# Patient Record
Sex: Male | Born: 1945
Health system: Southern US, Community
[De-identification: ages and names within clinical notes are randomized; demographics above are authoritative.]

## PROBLEM LIST (undated history)

## (undated) DIAGNOSIS — Z8679 Personal history of other diseases of the circulatory system: Secondary | ICD-10-CM

## (undated) DIAGNOSIS — I739 Peripheral vascular disease, unspecified: Secondary | ICD-10-CM

## (undated) DIAGNOSIS — A630 Anogenital (venereal) warts: Secondary | ICD-10-CM

## (undated) DIAGNOSIS — I714 Abdominal aortic aneurysm, without rupture, unspecified: Secondary | ICD-10-CM

## (undated) DIAGNOSIS — E785 Hyperlipidemia, unspecified: Secondary | ICD-10-CM

## (undated) DIAGNOSIS — I6529 Occlusion and stenosis of unspecified carotid artery: Secondary | ICD-10-CM

## (undated) DIAGNOSIS — I10 Essential (primary) hypertension: Secondary | ICD-10-CM

## (undated) HISTORY — DX: Essential (primary) hypertension: I10

## (undated) HISTORY — DX: Anogenital (venereal) warts: A63.0

## (undated) HISTORY — DX: Hyperlipidemia, unspecified: E78.5

## (undated) HISTORY — PX: COLONOSCOPY: SHX174

## (undated) HISTORY — DX: Peripheral vascular disease, unspecified: I73.9

## (undated) HISTORY — DX: Abdominal aortic aneurysm, without rupture, unspecified: I71.40

## (undated) HISTORY — DX: Abdominal aortic aneurysm, without rupture: I71.4

## (undated) HISTORY — DX: Occlusion and stenosis of unspecified carotid artery: I65.29

## (undated) HISTORY — DX: Personal history of other diseases of the circulatory system: Z86.79

---
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
## 2005-04-24 ENCOUNTER — Ambulatory Visit: Payer: Self-pay | Admitting: Internal Medicine

## 2005-04-29 ENCOUNTER — Ambulatory Visit: Payer: Self-pay | Admitting: Internal Medicine

## 2005-05-10 HISTORY — PX: CAROTID ENDARTERECTOMY: SUR193

## 2005-05-17 ENCOUNTER — Ambulatory Visit: Payer: Self-pay

## 2005-05-17 ENCOUNTER — Encounter: Payer: Self-pay | Admitting: Cardiology

## 2005-05-23 ENCOUNTER — Ambulatory Visit: Payer: Self-pay | Admitting: Cardiology

## 2005-05-28 ENCOUNTER — Encounter: Admission: RE | Admit: 2005-05-28 | Discharge: 2005-05-28 | Payer: Self-pay | Admitting: *Deleted

## 2005-05-31 ENCOUNTER — Inpatient Hospital Stay (HOSPITAL_COMMUNITY): Admission: RE | Admit: 2005-05-31 | Discharge: 2005-06-01 | Payer: Self-pay | Admitting: *Deleted

## 2005-05-31 ENCOUNTER — Encounter (INDEPENDENT_AMBULATORY_CARE_PROVIDER_SITE_OTHER): Payer: Self-pay | Admitting: *Deleted

## 2005-06-28 ENCOUNTER — Ambulatory Visit: Payer: Self-pay | Admitting: Internal Medicine

## 2005-07-19 ENCOUNTER — Ambulatory Visit: Payer: Self-pay | Admitting: Gastroenterology

## 2005-07-30 ENCOUNTER — Ambulatory Visit: Payer: Self-pay | Admitting: Gastroenterology

## 2005-07-30 ENCOUNTER — Encounter (INDEPENDENT_AMBULATORY_CARE_PROVIDER_SITE_OTHER): Payer: Self-pay | Admitting: *Deleted

## 2005-10-23 ENCOUNTER — Ambulatory Visit: Payer: Self-pay | Admitting: Cardiology

## 2005-11-18 ENCOUNTER — Ambulatory Visit: Payer: Self-pay

## 2005-12-25 ENCOUNTER — Ambulatory Visit: Payer: Self-pay | Admitting: Cardiology

## 2006-07-14 ENCOUNTER — Ambulatory Visit: Payer: Self-pay | Admitting: Internal Medicine

## 2006-08-07 ENCOUNTER — Ambulatory Visit: Payer: Self-pay | Admitting: Internal Medicine

## 2006-08-07 LAB — CONVERTED CEMR LAB
Bilirubin, Direct: 0.1 mg/dL (ref 0.0–0.3)
Cholesterol: 136 mg/dL (ref 0–200)
GFR calc Af Amer: 97 mL/min
GFR calc non Af Amer: 80 mL/min
Glucose, Bld: 113 mg/dL — ABNORMAL HIGH (ref 70–99)
HDL: 38.7 mg/dL — ABNORMAL LOW (ref >39.0)
Hgb A1c MFr Bld: 5.9 % (ref 4.6–6.0)
LDL Cholesterol: 64 mg/dL (ref 0–99)
Sodium: 138 meq/L (ref 135–145)
TSH: 2.6 microintl units/mL (ref 0.35–5.50)
Total CHOL/HDL Ratio: 3.5

## 2006-08-28 ENCOUNTER — Ambulatory Visit: Payer: Self-pay | Admitting: Internal Medicine

## 2006-12-25 ENCOUNTER — Emergency Department (HOSPITAL_COMMUNITY): Admission: EM | Admit: 2006-12-25 | Discharge: 2006-12-25 | Payer: Self-pay | Admitting: Emergency Medicine

## 2007-10-16 DIAGNOSIS — A630 Anogenital (venereal) warts: Secondary | ICD-10-CM | POA: Insufficient documentation

## 2007-10-16 DIAGNOSIS — R0989 Other specified symptoms and signs involving the circulatory and respiratory systems: Secondary | ICD-10-CM | POA: Insufficient documentation

## 2007-10-16 DIAGNOSIS — E785 Hyperlipidemia, unspecified: Secondary | ICD-10-CM | POA: Insufficient documentation

## 2007-10-16 DIAGNOSIS — I739 Peripheral vascular disease, unspecified: Secondary | ICD-10-CM | POA: Insufficient documentation

## 2007-10-16 DIAGNOSIS — F1010 Alcohol abuse, uncomplicated: Secondary | ICD-10-CM | POA: Insufficient documentation

## 2007-10-16 DIAGNOSIS — I10 Essential (primary) hypertension: Secondary | ICD-10-CM | POA: Insufficient documentation

## 2007-10-16 HISTORY — DX: Peripheral vascular disease, unspecified: I73.9

## 2007-10-16 HISTORY — DX: Essential (primary) hypertension: I10

## 2007-10-19 ENCOUNTER — Ambulatory Visit: Payer: Self-pay | Admitting: Internal Medicine

## 2007-10-22 ENCOUNTER — Ambulatory Visit: Payer: Self-pay

## 2007-10-22 ENCOUNTER — Encounter: Payer: Self-pay | Admitting: Internal Medicine

## 2007-12-07 ENCOUNTER — Encounter: Payer: Self-pay | Admitting: Internal Medicine

## 2007-12-23 ENCOUNTER — Ambulatory Visit: Payer: Self-pay | Admitting: Internal Medicine

## 2007-12-23 LAB — CONVERTED CEMR LAB
AST: 25 units/L (ref 0–37)
Albumin: 4 g/dL (ref 3.5–5.2)
Alkaline Phosphatase: 51 units/L (ref 39–117)
BUN: 16 mg/dL (ref 6–23)
Chloride: 107 meq/L (ref 96–112)
Cholesterol: 137 mg/dL (ref 0–200)
GFR calc non Af Amer: 80 mL/min
Glucose, Bld: 126 mg/dL — ABNORMAL HIGH (ref 70–99)
Potassium: 4.7 meq/L (ref 3.5–5.1)
Total Protein: 7.3 g/dL (ref 6.0–8.3)

## 2008-01-18 ENCOUNTER — Ambulatory Visit: Payer: Self-pay | Admitting: Internal Medicine

## 2008-02-04 ENCOUNTER — Encounter: Payer: Self-pay | Admitting: Internal Medicine

## 2008-10-13 ENCOUNTER — Ambulatory Visit: Payer: Self-pay | Admitting: Internal Medicine

## 2008-10-19 ENCOUNTER — Ambulatory Visit: Payer: Self-pay | Admitting: Internal Medicine

## 2008-10-19 LAB — CONVERTED CEMR LAB
ALT: 34 units/L (ref 0–53)
AST: 27 units/L (ref 0–37)
Alkaline Phosphatase: 54 units/L (ref 39–117)
BUN: 13 mg/dL (ref 6–23)
Basophils Absolute: 0 10*3/uL (ref 0.0–0.1)
Bilirubin, Direct: 0.1 mg/dL (ref 0.0–0.3)
CO2: 31 meq/L (ref 19–32)
Cholesterol: 158 mg/dL (ref 0–200)
Eosinophils Absolute: 0.1 10*3/uL (ref 0.0–0.7)
GFR calc non Af Amer: 79.77 mL/min (ref >60)
Glucose, Bld: 115 mg/dL — ABNORMAL HIGH (ref 70–99)
HCT: 37.2 % — ABNORMAL LOW (ref 39.0–52.0)
Lymphs Abs: 1.9 10*3/uL (ref 0.7–4.0)
MCHC: 35.6 g/dL (ref 30.0–36.0)
MCV: 93.5 fL (ref 78.0–100.0)
Monocytes Absolute: 0.6 10*3/uL (ref 0.1–1.0)
Monocytes Relative: 8.9 % (ref 3.0–12.0)
Platelets: 184 10*3/uL (ref 150.0–400.0)
Potassium: 4.3 meq/L (ref 3.5–5.1)
RDW: 12.6 % (ref 11.5–14.6)
Total Bilirubin: 1 mg/dL (ref 0.3–1.2)
Total Protein: 7.5 g/dL (ref 6.0–8.3)

## 2008-10-20 ENCOUNTER — Encounter: Payer: Self-pay | Admitting: Internal Medicine

## 2008-10-21 ENCOUNTER — Encounter: Payer: Self-pay | Admitting: Internal Medicine

## 2008-10-21 ENCOUNTER — Ambulatory Visit: Payer: Self-pay

## 2009-04-17 ENCOUNTER — Ambulatory Visit: Payer: Self-pay | Admitting: Internal Medicine

## 2009-04-17 DIAGNOSIS — R7309 Other abnormal glucose: Secondary | ICD-10-CM | POA: Insufficient documentation

## 2009-10-09 ENCOUNTER — Ambulatory Visit: Payer: Self-pay | Admitting: Internal Medicine

## 2009-10-09 LAB — CONVERTED CEMR LAB
ALT: 29 units/L (ref 0–53)
Albumin: 3.9 g/dL (ref 3.5–5.2)
CO2: 30 meq/L (ref 19–32)
Calcium: 9.3 mg/dL (ref 8.4–10.5)
Chloride: 105 meq/L (ref 96–112)
Creatinine, Ser: 1 mg/dL (ref 0.4–1.5)
Glucose, Bld: 120 mg/dL — ABNORMAL HIGH (ref 70–99)
HDL: 38.3 mg/dL — ABNORMAL LOW (ref >39.00)
Hgb A1c MFr Bld: 6.3 % (ref 4.6–6.5)
Total Protein: 6.8 g/dL (ref 6.0–8.3)
Triglycerides: 277 mg/dL — ABNORMAL HIGH (ref 0.0–149.0)

## 2009-10-16 ENCOUNTER — Ambulatory Visit: Payer: Self-pay | Admitting: Internal Medicine

## 2010-02-14 ENCOUNTER — Ambulatory Visit: Payer: Self-pay | Admitting: Internal Medicine

## 2010-02-14 LAB — CONVERTED CEMR LAB
BUN: 15 mg/dL (ref 6–23)
Chloride: 101 meq/L (ref 96–112)
GFR calc non Af Amer: 89.72 mL/min (ref >60)
Glucose, Bld: 105 mg/dL — ABNORMAL HIGH (ref 70–99)
Hgb A1c MFr Bld: 6.2 % (ref 4.6–6.5)
Potassium: 4.7 meq/L (ref 3.5–5.1)
Sodium: 139 meq/L (ref 135–145)

## 2010-02-19 ENCOUNTER — Ambulatory Visit: Payer: Self-pay | Admitting: Internal Medicine

## 2010-04-13 ENCOUNTER — Telehealth: Payer: Self-pay | Admitting: Internal Medicine

## 2010-06-10 HISTORY — PX: OTHER SURGICAL HISTORY: SHX169

## 2010-07-10 NOTE — Assessment & Plan Note (Signed)
Summary: 6 month follow up/mhf   Vital Signs:  Patient profile:   65 year old male Height:      70 inches Weight:      207 pounds BMI:     29.81 Temp:     98.0 degrees F Pulse rate:   68 / minute Pulse rhythm:   regular Resp:     18 per minute BP sitting:   114 / 74  (right arm) Cuff size:   large  Vitals Entered By: Glendell Docker CMA (Oct 16, 2009 11:17 AM) CC: Rm 2- 6 Month Follow up disease management Is Patient Diabetic? No Comments Refill on  Lisinopril Hct   Primary Care Provider:  Dondra Spry DO  CC:  Rm 2- 6 Month Follow up disease management.  History of Present Illness:  Hypertension Follow-Up      This is a 65 year old man who presents for Hypertension follow-up.  The patient denies headaches and edema.  The patient denies the following associated symptoms: chest pain.  Compliance with medications (by patient report) has been near 100%.  The patient reports that dietary compliance has been fair.  some wt gain since prev visit.  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   Preventive Screening-Counseling & Management  Alcohol-Tobacco     Smoking Status: quit  Allergies (verified): No Known Drug Allergies  Past History:  Past Medical History: Hyperlipidemia Hypertension History of carotid stenosis - S/P right carotid endarterectomy 12/06 Hx of Left lower ext claudication  Hx of genital warts    Past Surgical History: Knee surgery 1962  Appendectomy 1960  Right carotid endarterectomy 12/06  Family History: Significant for mother deceased, age 80, had her first cerebrovascular accident in her 31s.  Patient has a brother with peripheral vascular disease and a father who is status post leg amputation secondary to peripheral vascular disease.  Patient denies any early heart disease in the family.   Prostate ca - no     Social History: Married -to his second wife.  Has a daughter from his first marriage who is in her early 62s.   He  drinks approximately 4 alcoholic beverages, beer, a day.  He has done so since age 69.  Denies any binge drinking or heavy alcohol use.  Quit tobacco 2 year ago.  He has smoked on and off x20-30 years.  For prolonged periods he has smoked 2-3 packs a day.   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   Neck:  faint right carotid bruit Lungs:  normal respiratory effort and normal breath sounds.   Heart:  normal rate, regular rhythm, and no gallop.   Extremities:  No lower extremity edema  Neurologic:  cranial nerves II-XII intact and gait normal.   Psych:  normally interactive and good eye contact.     Impression & Recommendations:  Problem # 1:  DIABETES MELLITUS, TYPE II, BORDERLINE (ICD-790.29) A1c slightly worse. He was exposed to agent orange during [place] war which apparently can increase risk for DM. start metformin.  limit carb intake.  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 1/2 by mouth two times a day x 1 week,  then one by mouth two times a day  Labs Reviewed: Creat: 1.0 (10/09/2009)  Problem # 2:  HYPERTENSION (ICD-401.9) stable.  Maintain current medication regimen.  His updated medication list for this problem includes:    Lisinopril-hydrochlorothiazide 10-12.5 Mg Tabs (Lisinopril-hydrochlorothiazide) .Marland Kitchen... Take 1 tablet by mouth once a day  BP today: 114/74 Prior BP: 124/60 (04/17/2009)  Labs Reviewed: K+: 4.6 (10/09/2009) Creat: : 1.0 (10/09/2009)   Chol: 151 (10/09/2009)   HDL: 38.30 (10/09/2009)   LDL: 70 (12/23/2007)   TG: 277.0 (10/09/2009)  Complete Medication List: 1)  Levitra 20 Mg Tabs (Vardenafil hcl) .... Take 1 tablet by mouth once a day 2)  Lisinopril-hydrochlorothiazide 10-12.5 Mg Tabs (Lisinopril-hydrochlorothiazide) .... Take 1 tablet by mouth once  a day 3)  Lipitor 80 Mg Tabs (Atorvastatin calcium) .Marland Kitchen.. 1 tablet by mouth every evening 4)  Metformin Hcl 500 Mg Tabs (Metformin hcl) .... 1/2 by mouth two times a day x 1 week,  then one by mouth two times a day  Patient Instructions: 1)  Please schedule a follow-up appointment in 4 months. 2)  BMP prior to visit, ICD-9:  401.9 3)  HbgA1C prior to visit, ICD-9:  790.29 4)  Limit your carb intake to 30 grams per meal / 100 grams per day 5)  Please return for lab work one (1) week before your next appointment.  Prescriptions: LEVITRA 20 MG  TABS (VARDENAFIL HCL) Take 1 tablet by mouth once a day  #5 x 11   Entered and Authorized by:   D. Thomos Lemons DO   Signed by:   D. Thomos Lemons DO on 10/16/2009   Method used:   [facility]        [facility]  [address] [phone][facility] (retail)       [address]       [place], [place]  [address]       Ph: [phone] or [phone]       Fax: [phone]   RxID:   [id number] LIPITOR 80 MG  TABS (ATORVASTATIN CALCIUM) 1 tablet by mouth every evening  #90 x 3   Entered and Authorized by:   D. Thomos Lemons DO   Signed by:   D. Thomos Lemons DO on 10/16/2009   Method used:   [facility]        [facility] [phone]* (retail)       [address]       [place], [place]  [address]       Ph: [phone] or [phone]       Fax: [phone]   RxID:   [id number] LISINOPRIL-HYDROCHLOROTHIAZIDE 10-12.5 MG TABS (LISINOPRIL-HYDROCHLOROTHIAZIDE) Take 1 tablet by mouth once a day  #90 x 3   Entered and Authorized by:   D. Thomos Lemons DO   Signed by:   D. Thomos Lemons DO on 10/16/2009   Method used:   [facility]        [facility] [phone]* (retail)       [address]       [place], [place]  [address]       Ph: [phone] or [phone]       Fax: [phone]   RxID:   [id number] METFORMIN HCL 500 MG TABS (METFORMIN HCL) 1/2 by mouth  two times a day x 1 week,  then one by mouth two times a day  #60 x 3  Entered and Authorized by:   D. Thomos Lemons DO   Signed by:   D. Thomos Lemons DO on 10/16/2009   Method used:   [facility]        [facility] [phone]* (retail)       [address]       [place], [place]  [address]       Ph: [phone] or [phone]       Fax: [phone]   RxID:   [id number]   Current Allergies (reviewed today): No known allergies

## 2010-07-10 NOTE — Progress Notes (Signed)
Summary: Medication Refill  Phone Note Call from Patient Call back at [phone]   Caller: Patient Summary of Call: patient called requesting refill of Lipitor to [facility] and [facility].He was informed rx would be sent, and to check with the pharmacy for the medication. Initial call taken by: Glendell Docker CMA,  April 13, 2010 3:10 PM    Prescriptions: LEVITRA 20 MG  TABS (VARDENAFIL HCL) Take 1 tablet by mouth once a day  #5 x 5   Entered by:   Glendell Docker CMA   Authorized by:   D. Thomos Lemons DO   Signed by:   Glendell Docker CMA on 04/13/2010   Method used:   [facility]        [facility] [phone]* (retail)       [address]       [place], [place]  [address]       Ph: [phone] or [phone]       Fax: [phone]   RxID:   [id number] LIPITOR 80 MG  TABS (ATORVASTATIN CALCIUM) 1 tablet by mouth every evening  #90 x 3   Entered by:   Glendell Docker CMA   Authorized by:   D. Thomos Lemons DO   Signed by:   Glendell Docker CMA on 04/13/2010   Method used:   Electronically to        [facility]* (retail)             ,          Ph: [phone]       Fax: [phone]   RxID:   [id number]

## 2010-07-10 NOTE — Assessment & Plan Note (Signed)
Summary: 4 month follow up/mhf   Vital Signs:  Patient profile:   65 year old male Weight:      201 pounds BMI:     28.94 O2 Sat:      98 % on Room air Temp:     97.8 degrees F oral Pulse rate:   69 / minute Pulse rhythm:   regular Resp:     18 per minute BP sitting:   110 / 70  (left arm) Cuff size:   large  Vitals Entered By: Glendell Docker CMA (February 19, 2010 10:50 AM)  O2 Flow:  Room air CC: 4  month follow up Is Patient Diabetic? No Pain Assessment Patient in pain? no        Primary Care Provider:  Dondra Spry DO  CC:  4  month follow up.  History of Present Illness: 65 y/o white male for f/u   DM II - no significant change in diet.  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  Patient has a brother with peripheral vascular disease and a father who is status post leg amputation secondary to peripheral vascular disease.  Patient denies any early heart disease in the family.   Prostate ca - no      Physical Exam  General:  alert, well-developed, and well-nourished.   Lungs:  normal respiratory effort and normal breath sounds.   Heart:  normal rate, regular rhythm, and no gallop.   Extremities:  No lower extremity edema  Neurologic:  cranial nerves II-XII intact and gait normal.     Impression & Recommendations:  Problem # 1:  DIABETES MELLITUS, TYPE II, BORDERLINE  (ICD-790.29) Assessment Unchanged Pt counseled on diet and exercise.  His updated medication list for this problem includes:    Metformin Hcl 500 Mg Tabs (Metformin hcl) ..... One by mouth two times a day  Labs Reviewed: Creat: 0.9 (02/14/2010)     Problem # 2:  HYPERTENSION (ICD-401.9) Assessment: Unchanged  His updated medication list for this problem includes:    Lisinopril-hydrochlorothiazide 10-12.5 Mg Tabs (Lisinopril-hydrochlorothiazide) .Marland Kitchen... Take 1 tablet by mouth once a day  BP today: 110/70 Prior BP: 114/74 (10/16/2009)  Labs Reviewed: K+: 4.7 (02/14/2010) Creat: : 0.9 (02/14/2010)   Chol: 151 (10/09/2009)   HDL: 38.30 (10/09/2009)   LDL: 70 (12/23/2007)   TG: 277.0 (10/09/2009)  Complete Medication List: 1)  Levitra 20 Mg Tabs (Vardenafil hcl) .... Take 1 tablet by mouth once a day 2)  Lisinopril-hydrochlorothiazide 10-12.5 Mg Tabs (Lisinopril-hydrochlorothiazide) .... Take 1 tablet by mouth once a day 3)  Lipitor 80 Mg Tabs (Atorvastatin calcium) .Marland Kitchen.. 1 tablet by mouth every evening 4)  Metformin Hcl 500 Mg Tabs (Metformin hcl) .... One by mouth two times a day  Patient Instructions: 1)  Please schedule a follow-up appointment in 6 months. 2)  BMET: 401.9 3)  HbgA1C prior to visit, ICD-9: 790.29 4)  Please return for lab work one (1)  week before your next appointment.  Prescriptions: METFORMIN HCL 500 MG TABS (METFORMIN HCL) one by mouth two times a day  #180 x 1   Entered and Authorized by:   D. Thomos Lemons DO   Signed by:   D. Thomos Lemons DO on 02/19/2010   Method used:   [facility]        [facility] [address] [phone]* (retail)       [address]       [place], [place]  [address]       Ph: [phone] or [phone]       Fax: [phone]   RxID:   [id number] METFORMIN HCL 500 MG TABS (METFORMIN HCL) one by mouth two times a day  #60 x 3   Entered and Authorized by:   D. Thomos Lemons DO   Signed by:   D. Thomos Lemons  DO on 02/19/2010   Method used:   [facility]        [facility] [phone]* (retail)       [address]       [place], [place]  [address]       Ph: [phone] or [phone]       Fax: [phone]   RxID:   [id number]   Current Allergies (reviewed today): No known allergies    Immunization History:  Influenza Immunization History:    Influenza:  declined (02/19/2010)   Contraindications/Deferment of Procedures/Staging:    Test/Procedure: FLU VAX    Reason for deferment: patient declined

## 2010-08-16 ENCOUNTER — Encounter (INDEPENDENT_AMBULATORY_CARE_PROVIDER_SITE_OTHER): Payer: Self-pay | Admitting: *Deleted

## 2010-08-16 ENCOUNTER — Other Ambulatory Visit: Payer: Self-pay | Admitting: Internal Medicine

## 2010-08-16 ENCOUNTER — Other Ambulatory Visit: Payer: BLUE CROSS/BLUE SHIELD

## 2010-08-16 DIAGNOSIS — I10 Essential (primary) hypertension: Secondary | ICD-10-CM

## 2010-08-16 DIAGNOSIS — R7301 Impaired fasting glucose: Secondary | ICD-10-CM

## 2010-08-16 LAB — BASIC METABOLIC PANEL
CO2: 28 mEq/L (ref 19–32)
Chloride: 101 mEq/L (ref 96–112)
Creatinine, Ser: 1 mg/dL (ref 0.4–1.5)
Potassium: 4.7 mEq/L (ref 3.5–5.1)

## 2010-08-16 LAB — HEMOGLOBIN A1C: Hgb A1c MFr Bld: 6.3 % (ref 4.6–6.5)

## 2010-08-20 ENCOUNTER — Encounter: Payer: Self-pay | Admitting: Internal Medicine

## 2010-08-20 ENCOUNTER — Ambulatory Visit (INDEPENDENT_AMBULATORY_CARE_PROVIDER_SITE_OTHER): Payer: BLUE CROSS/BLUE SHIELD | Admitting: Internal Medicine

## 2010-08-20 DIAGNOSIS — E785 Hyperlipidemia, unspecified: Secondary | ICD-10-CM

## 2010-08-20 DIAGNOSIS — I739 Peripheral vascular disease, unspecified: Secondary | ICD-10-CM

## 2010-08-20 DIAGNOSIS — R7309 Other abnormal glucose: Secondary | ICD-10-CM

## 2010-08-20 DIAGNOSIS — I10 Essential (primary) hypertension: Secondary | ICD-10-CM

## 2010-08-21 ENCOUNTER — Encounter: Payer: Self-pay | Admitting: Internal Medicine

## 2010-08-21 ENCOUNTER — Encounter (INDEPENDENT_AMBULATORY_CARE_PROVIDER_SITE_OTHER): Payer: Medicare Other

## 2010-08-21 DIAGNOSIS — I739 Peripheral vascular disease, unspecified: Secondary | ICD-10-CM

## 2010-08-21 DIAGNOSIS — I70219 Atherosclerosis of native arteries of extremities with intermittent claudication, unspecified extremity: Secondary | ICD-10-CM

## 2010-08-21 DIAGNOSIS — I6529 Occlusion and stenosis of unspecified carotid artery: Secondary | ICD-10-CM

## 2010-08-22 ENCOUNTER — Encounter: Payer: Self-pay | Admitting: Internal Medicine

## 2010-08-26 ENCOUNTER — Telehealth: Payer: Self-pay | Admitting: Internal Medicine

## 2010-08-28 NOTE — Miscellaneous (Signed)
Summary: Orders Update  Clinical Lists Changes  Orders: Added new Test order of Carotid Duplex (Carotid Duplex) - Signed Added new Test order of Arterial Duplex Lower Extremity (Arterial Duplex Low) - Signed

## 2010-08-31 ENCOUNTER — Encounter: Payer: Self-pay | Admitting: Gastroenterology

## 2010-09-06 NOTE — Assessment & Plan Note (Signed)
Summary: 6 month follow up/mhf   Vital Signs:  Patient profile:   65 year old male Height:      70 inches Weight:      202.75 pounds BMI:     29.20 O2 Sat:      99 % on Room air Temp:     97.5 degrees F Pulse rate:   71 / minute Resp:     16 per minute BP sitting:   122 / 70  (right arm) Cuff size:   large  Vitals Entered By: Glendell Docker CMA (August 20, 2010 8:06 AM)  O2 Flow:  Room air CC: 6 Month Follow up  Is Patient Diabetic? No Pain Assessment Patient in pain? no      Comments refill to [facility], no concerns   Primary Care Provider:  D. Thomos Lemons DO  CC:  6 Month Follow up .  History of Present Illness: 65 y/o with hx of carotid dz,  htn and borderline DM II for follow up overall doing well.  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  htn - stable  hyperlipidemia - stable DM II borderline - suboptimal dietary compliance   Preventive Screening-Counseling & Management  Alcohol-Tobacco     Smoking Status: quit  Allergies (verified): No Known Drug Allergies  Past History:  Past Medical History: Hyperlipidemia Hypertension  History of carotid stenosis - S/P right carotid endarterectomy 12/06 Hx of Left lower ext claudication  Hx of genital warts      Past Surgical History: Knee surgery 1962  Appendectomy 1960  Right carotid endarterectomy 12/06     Family History: Significant for mother deceased, age 71, had her first cerebrovascular accident in her 62s.  Patient has a brother with peripheral vascular disease and a father who is status post leg amputation secondary to peripheral vascular disease.  Patient denies any early heart disease in the family.   Prostate ca - no        Social History: Married -to his second wife.  Has a daughter from his first marriage who is in her early 57s.   He drinks approximately 4 alcoholic beverages, beer, a day.  He has done so since age 58.  Denies any binge drinking or heavy alcohol use.  Quit tobacco  2 year ago.  He has smoked on and off x20-30 years.  For prolonged periods he has smoked 2-3 packs a day.   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   Lungs:  normal respiratory effort and normal breath sounds.   Heart:  normal rate, regular rhythm, and no gallop.   Pulses:  diminished PD and PT bilaterally Extremities:  No lower extremity edema Skin:  feet are warm and dry Psych:  normally interactive, good eye contact, not anxious appearing, and not depressed appearing.     Impression & Recommendations:  Problem # 1:  DIABETES MELLITUS, TYPE II, BORDERLINE (ICD-790.29) Assessment Unchanged  His updated medication list for this problem includes:    Metformin Hcl 500 Mg Tabs (Metformin hcl) ..... One and 1/2 tab by mouth two times a day  Labs Reviewed: Creat: 1.0 (08/16/2010)     Problem # 2:  UNSPECIFIED PERIPHERAL VASCULAR DISEASE (ICD-443.9)  Orders: LE Arterial Doppler/ABI (Le arterial doppler)  Problem # 3:  HYPERTENSION (ICD-401.9) Assessment: Unchanged  His updated medication list for this problem includes:    Lisinopril-hydrochlorothiazide 10-12.5 Mg Tabs (Lisinopril-hydrochlorothiazide) .Marland KitchenMarland KitchenMarland KitchenMarland Kitchen  Take 1 tablet by mouth once a day  BP today: 122/70 Prior BP: 110/70 (02/19/2010)  Labs Reviewed: K+: 4.7 (08/16/2010) Creat: : 1.0 (08/16/2010)   Chol: 151 (10/09/2009)   HDL: 38.30 (10/09/2009)   LDL: 70 (12/23/2007)   TG: 277.0 (10/09/2009)  Problem # 4:  HYPERLIPIDEMIA (ICD-272.4) Assessment: Unchanged  Orders: LE Arterial Doppler/ABI (Le arterial doppler)  His updated medication list for this problem includes:    Lipitor 80 Mg Tabs (Atorvastatin calcium) .Marland Kitchen... 1 tablet by mouth every evening  Labs Reviewed: SGOT: 22 (10/09/2009)   SGPT: 29 (10/09/2009)   HDL:38.30 (10/09/2009), 39.70 (10/19/2008)  LDL:70 (12/23/2007), 64 (08/07/2006)  Chol:151 (10/09/2009), 158 (10/19/2008)  Trig:277.0  (10/09/2009), 224.0 (10/19/2008)  Complete Medication List: 1)  Levitra 20 Mg Tabs (Vardenafil hcl) .... Take 1 tablet by mouth once a day 2)  Lisinopril-hydrochlorothiazide 10-12.5 Mg Tabs (Lisinopril-hydrochlorothiazide) .... Take 1 tablet by mouth once a day 3)  Lipitor 80 Mg Tabs (Atorvastatin calcium) .Marland Kitchen.. 1 tablet by mouth every evening 4)  Metformin Hcl 500 Mg Tabs (Metformin hcl) .... One and 1/2 tab by mouth two times a day 5)  Aspirin 81 Mg Tbec (Aspirin) .... One by mouth once daily  Other Orders: Doppler Referral (Doppler)  Patient Instructions: 1)  Please schedule a follow-up appointment in 6 months (30 min appt) 2)  BMP prior to visit, ICD-9: 401.9 3)  Hepatic Panel prior to visit, ICD-9: 272.4 4)  Lipid Panel prior to visit, ICD-9: 272.4 5)  HbgA1C prior to visit, ICD-9: 790.29 6)  PSA - v76.44 7)  Please return for lab work one (1) week before your next appointment.  Prescriptions: LIPITOR 80 MG  TABS (ATORVASTATIN CALCIUM) 1 tablet by mouth every evening  #90 x 1   Entered and Authorized by:   D. Thomos Lemons DO   Signed by:   D. Thomos Lemons DO on 08/20/2010   Method used:   Print then Give to Patient   RxID:   [id number] LISINOPRIL-HYDROCHLOROTHIAZIDE 10-12.5 MG TABS (LISINOPRIL-HYDROCHLOROTHIAZIDE) Take 1 tablet by mouth once a day  #90 x 1   Entered and Authorized by:   D. Thomos Lemons DO   Signed by:   D. Thomos Lemons DO on 08/20/2010   Method used:   Print then Give to Patient   RxID:   [id number] METFORMIN HCL 500 MG TABS (METFORMIN HCL) one and 1/2 tab by mouth two times a day  #270 x 1   Entered and Authorized by:   D. Thomos Lemons DO   Signed by:   D. Thomos Lemons DO on 08/20/2010   Method used:   Print then Give to Patient   RxID:   [id number]    Orders Added: 1)  Doppler Referral [Doppler] 2)  LE Arterial Doppler/ABI [Le arterial doppler] 3)  Est. Patient Level III [01027]    Current Allergies (reviewed today): No known allergies

## 2010-09-06 NOTE — Letter (Signed)
Summary: Colonoscopy Date Change Letter  [facility]  [address] [place], [place] [address]   Phone: [phone]  Fax: [phone]      August 31, 2010 MRN: [id number]   Samuel [name] [address] [place], [place]  [address]   Dear Mr. HOUSMAN,   Previously you were recommended to have a repeat colonoscopy around this time. Your chart was recently reviewed by DR. Cj Edgell of [facility]. Follow up colonoscopy is now recommended in FEB 2017. This revised recommendation is based on current, nationally recognized guidelines for colorectal cancer screening and polyp surveillance. These guidelines are endorsed by the American Cancer Society, [employer] on Colorectal Cancer as well as numerous other major medical organizations.  Please understand that our recommendation assumes that you do not have any new symptoms such as bleeding, a change in bowel habits, anemia, or significant abdominal discomfort. If you do have any concerning GI symptoms or want to discuss the guideline recommendations, please call to arrange an office visit at your earliest convenience. Otherwise we will keep you in our reminder system and contact you 1-2 months prior to the date listed above to schedule your next colonoscopy.  Thank you,  Barbette Hair. Arlyce Dice, M.D.  [facility] [phone]

## 2010-09-06 NOTE — Letter (Signed)
Summary: Colonoscopy Letter  [facility]  [address] [place], [place] [address]   Phone: [phone]  Fax: [phone]      August 31, 2010 MRN: [id number]   Samuel [name] [address] [place], [place]  [address]   Dear Mr. CHERIAN,   According to your medical record, it is time for you to schedule a Colonoscopy. The American Cancer Society recommends this procedure as a method to detect early colon cancer. Patients with a family history of colon cancer, or a personal history of colon polyps or inflammatory bowel disease are at increased risk.  This letter has been generated based on the recommendations made at the time of your procedure. If you feel that in your particular situation this may no longer apply, please contact our office.  Please call our office at [phone] to schedule this appointment or to update your records at your earliest convenience.  Thank you for cooperating with [place] to provide you with the very best care possible.   Sincerely,  Barbette Hair. Arlyce Dice, M.D.  [facility] [phone]

## 2010-09-06 NOTE — Progress Notes (Signed)
Summary: Test Results  Phone Note Outgoing Call   Summary of Call: call pt -  lower ext ABI study worse esp left leg.  please make sure pt has follow up with vascular specialist Initial call taken by: D. Thomos Lemons DO,  August 26, 2010 10:45 PM  Follow-up for Phone Call        call placed to patient at [phone], no answer, no voice message.  Glendell Docker CMA  August 27, 2010 9:44 AM   Additional Follow-up for Phone Call Additional follow up Details #1::        call placed to patient at [phone], he was informed per Dr Artist Pais instructions. He is not scheduled currently with vascular, is a referral needed for this appointment? Additional Follow-up by: Glendell Docker CMA,  August 27, 2010 12:15 PM    Additional Follow-up for Phone Call Additional follow up Details #2::    call placed to patient at [phone], he was informed  that he would receive a call from referrals with the appointment date and time for vascular clinic. Follow-up by: Glendell Docker CMA,  August 27, 2010 5:02 PM

## 2010-09-11 ENCOUNTER — Encounter (INDEPENDENT_AMBULATORY_CARE_PROVIDER_SITE_OTHER): Payer: Medicare Other | Admitting: Vascular Surgery

## 2010-09-11 DIAGNOSIS — I70219 Atherosclerosis of native arteries of extremities with intermittent claudication, unspecified extremity: Secondary | ICD-10-CM

## 2010-09-11 NOTE — Consult Note (Signed)
NEW PATIENT CONSULTATION  Samuel [name], Samuel [name] DOB:  10-14-1945                                       09/11/2010 ZOXWR#:[id number]  The patient is a 65 year old male patient with a history of vascular occlusive disease having undergone right carotid endarterectomy by me about 7 years ago.  He now returns referred by Dr. Artist Pais for left leg claudication symptoms which began at about 1 block and require him to stop at about 2 blocks.  This is worse if he is going up inclines.  He has minimal symptoms in the right leg at that time.  He has no rest pain, history of nonhealing ulcers, infection or gangrene.  He had a vascular lab study performed at [facility] March 13 which revealed an ABI of 0.53 on the left and 0.84 on the right consistent with left superficial femoral occlusion and right superficial femoral stenosis based on the duplex scan performed at that time.  This has decreased since 2006 when his ABI on the right was 1.2 and on the left was 0.69.  CHRONIC MEDICAL PROBLEMS: 1. Hypertension. 2. Hyperlipidemia. 3. History of tobacco abuse. 4. Previous right carotid endarterectomy. 5. Negative for coronary artery disease, diabetes or stroke or COPD.  SOCIAL HISTORY:  The patient is married and has 1 child.  Works in Airline pilot.  Has not smoked in 7 years but smoked from 1-3 packs of cigarettes per day for 40 plus years.  He drinks occasional alcohol.  FAMILY HISTORY:  Positive for lower extremity occlusive disease and amputations in both parents, stroke in his mother, negative for coronary artery disease or diabetes.  REVIEW OF SYSTEMS:  Positive for dyspnea on exertion and claudication symptoms but all other systems in complete review of systems are negative.  PHYSICAL EXAM:  Vital signs:  Blood pressure 134/80, heart rate 75, respirations 14.  General:  He is a well-developed, well-nourished male in no apparent distress, alert and oriented x3.  HEENT:  Exam normal  for age.  EOMs intact.  Lungs:  Clear to auscultation.  No rhonchi or wheezing.  Cardiovascular:  Regular rhythm.  No murmurs.  Carotid pulses are 3+.  No bruits are heard.  There is a well-healed incision on the right side.  Abdomen:  Soft, nontender with no masses.  Musculoskeletal: Exam is free of major deformities.  Neurological:  Normal.  Skin:  Free of rashes.  Extremities:  Reveals 3+ femoral, 2+ popliteal and 2+ posterior tibial pulse on the right, left leg has a 3+ femoral, absent popliteal and distal pulses.  Both feet are well-perfused.  No venous insufficiency is noted.  Today I reviewed all of the previous records regarding his vascular lab studies and feel that he does have a left superficial femoral occlusion as well as a right superficial femoral stenosis greater than 50% in the mid thigh over a short distance.  I have recommended angiography to further evaluate this.  We will schedule this in the near future for possible PTA and stenting of his left SFA which is chronically occluded and possibly his right SFA.  He would like to discuss this with his wife and he will let us know when to schedule this in the near future.    Quita Skye Hart [place], M.D. Electronically Signed  JDL/MEDQ  D:  09/11/2010  T:  09/11/2010  Job:  [id number]  cc:  Barbette Hair. Artist Pais, DO

## 2010-09-25 ENCOUNTER — Observation Stay (HOSPITAL_COMMUNITY)
Admission: RE | Admit: 2010-09-25 | Discharge: 2010-09-26 | Disposition: A | Discharge status: Home / Self Care | Payer: Medicare Other | Source: Ambulatory Visit | Attending: Surgery | Admitting: Surgery

## 2010-09-25 DIAGNOSIS — Z7982 Long term (current) use of aspirin: Secondary | ICD-10-CM | POA: Insufficient documentation

## 2010-09-25 DIAGNOSIS — Z01810 Encounter for preprocedural cardiovascular examination: Secondary | ICD-10-CM | POA: Insufficient documentation

## 2010-09-25 DIAGNOSIS — Z01812 Encounter for preprocedural laboratory examination: Secondary | ICD-10-CM | POA: Insufficient documentation

## 2010-09-25 DIAGNOSIS — E785 Hyperlipidemia, unspecified: Secondary | ICD-10-CM | POA: Insufficient documentation

## 2010-09-25 DIAGNOSIS — I70219 Atherosclerosis of native arteries of extremities with intermittent claudication, unspecified extremity: Principal | ICD-10-CM | POA: Insufficient documentation

## 2010-09-25 DIAGNOSIS — R0609 Other forms of dyspnea: Secondary | ICD-10-CM | POA: Insufficient documentation

## 2010-09-25 DIAGNOSIS — I10 Essential (primary) hypertension: Secondary | ICD-10-CM | POA: Insufficient documentation

## 2010-09-25 DIAGNOSIS — R0989 Other specified symptoms and signs involving the circulatory and respiratory systems: Secondary | ICD-10-CM | POA: Insufficient documentation

## 2010-09-25 LAB — POCT ACTIVATED CLOTTING TIME
Activated Clotting Time: 181 seconds
Activated Clotting Time: 193 seconds
Activated Clotting Time: 199 seconds
Activated Clotting Time: 163 seconds

## 2010-09-25 LAB — POCT I-STAT, CHEM 8
BUN: 18 mg/dL (ref 6–23)
Chloride: 101 mEq/L (ref 96–112)
Creatinine, Ser: 1.2 mg/dL (ref 0.4–1.5)
Sodium: 139 mEq/L (ref 135–145)
TCO2: 28 mmol/L (ref 0–100)

## 2010-09-25 LAB — CBC
MCV: 93.4 fL (ref 78.0–100.0)
Platelets: 190 10*3/uL (ref 150–400)
RBC: 3.92 MIL/uL — ABNORMAL LOW (ref 4.22–5.81)
RDW: 13.2 % (ref 11.5–15.5)
WBC: 9.1 10*3/uL (ref 4.0–10.5)

## 2010-09-26 LAB — CBC
HCT: 38.5 % — ABNORMAL LOW (ref 39.0–52.0)
Hemoglobin: 13 g/dL (ref 13.0–17.0)
MCHC: 33.8 g/dL (ref 30.0–36.0)
RBC: 4.09 MIL/uL — ABNORMAL LOW (ref 4.22–5.81)
WBC: 11.4 10*3/uL — ABNORMAL HIGH (ref 4.0–10.5)

## 2010-09-26 LAB — BASIC METABOLIC PANEL
CO2: 29 mEq/L (ref 19–32)
Calcium: 9.4 mg/dL (ref 8.4–10.5)
GFR calc Af Amer: >60 mL/min (ref >60)
Glucose, Bld: 120 mg/dL — ABNORMAL HIGH (ref 70–99)
Potassium: 4.7 mEq/L (ref 3.5–5.1)
Sodium: 139 mEq/L (ref 135–145)

## 2010-09-27 ENCOUNTER — Encounter: Payer: Self-pay | Admitting: Internal Medicine

## 2010-09-27 ENCOUNTER — Ambulatory Visit (INDEPENDENT_AMBULATORY_CARE_PROVIDER_SITE_OTHER): Payer: Medicare Other | Admitting: Internal Medicine

## 2010-09-27 VITALS — BP 132/80 | HR 82 | Temp 98.1°F | Resp 18 | Ht 70.0 in | Wt 196.0 lb

## 2010-09-27 DIAGNOSIS — M109 Gout, unspecified: Secondary | ICD-10-CM | POA: Insufficient documentation

## 2010-09-27 MED ORDER — PREDNISONE 20 MG PO TABS: ORAL_TABLET | ORAL | Status: DC

## 2010-09-27 NOTE — Progress Notes (Signed)
  Subjective:    Patient ID: Samuel [name], male    DOB: 06-25-1945, 65 y.o.   MRN: [id number]  HPI 65 y/o male c/o bilateral toe pain.  He has hx of gout.  Hx of PVD - Stents placed on Tuesday.   Released yest.   2 AM this morning,  Pt noticed bilateral toe pain.  Just the first toe on both sides. 10 AM this his symptoms were severe so he took 2 dose of 50 mg of indomethasin   Review of Systems    negative for blue toes, no fever   Past Medical History  Diagnosis Date  . Hyperlipidemia   . Hypertension   . History of carotid stenosis     s/p right carotid endarterectomy 12/06  . Genital warts     history of    History   Social History  . Marital Status: Married    Spouse Name: N/A    Number of Children: N/A  . Years of Education: N/A   Occupational History  . Not on file.   Social History Main Topics  . Smoking status: Former Games developer  . Smokeless tobacco: Not on file   Comment: quit- for porlonged periods he has smoked 2-3 packs a day  . Alcohol Use: Not on file  . Drug Use: Not on file  . Sexually Active: Not on file   Other Topics Concern  . Not on file   Social History Narrative   Married -to his second wife.  Has a daughter from his first marriage who is in her early 9s.  He drinks approximately 4 alcoholic beverages, beer, a day.  He has done so since age 73.  Denies any binge drinking or heavy alcohol use.  Quit tobacco 2 year ago.  He has smoked on and off x20-30 years.  For prolonged periods he has smoked 2-3 packs a day.  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 Knee surgery 1962  . Appendectomy 1960  . Carotid endarterectomy 05/2005    right    Family History  Problem Relation Age of Onset  . Stroke Mother     deceased age 64 had her firsrt CVA age  51  . Peripheral vascular disease Brother   . Peripheral vascular disease Father     status post leg amputation  secondary to PVD    No  Known Allergies  No current outpatient prescriptions on file prior to visit.    BP 132/80  Pulse 82  Temp(Src) 98.1 F (36.7 C) (Oral)  Resp 18  Ht 5\' 10"  (1.778 m)  Wt 196 lb (88.905 kg)  BMI 28.12 kg/m2  SpO2 100%    Objective:   Physical Exam  Constitutional: He appears well-developed and well-nourished.  Cardiovascular: Normal rate, regular rhythm and normal heart sounds.   Pulmonary/Chest: Effort normal and breath sounds normal. He has no wheezes. He has no rales.  Skin: Skin is warm and dry. No rash noted. No pallor.       Mild warmth to base of  bilateral great toes.  Mild tenderness          Assessment & Plan:

## 2010-09-27 NOTE — Patient Instructions (Addendum)
Please complete the following lab tests before your next follow up appointment: BMET, uric acid - 274

## 2010-10-01 ENCOUNTER — Other Ambulatory Visit: Payer: Medicare Other

## 2010-10-02 ENCOUNTER — Other Ambulatory Visit (INDEPENDENT_AMBULATORY_CARE_PROVIDER_SITE_OTHER): Payer: Medicare Other

## 2010-10-02 DIAGNOSIS — M109 Gout, unspecified: Secondary | ICD-10-CM

## 2010-10-02 LAB — BASIC METABOLIC PANEL
Chloride: 102 mEq/L (ref 96–112)
GFR: 78.39 mL/min (ref >60.00)
Glucose, Bld: 109 mg/dL — ABNORMAL HIGH (ref 70–99)
Potassium: 5.1 mEq/L (ref 3.5–5.1)
Sodium: 139 mEq/L (ref 135–145)

## 2010-10-02 LAB — URIC ACID: Uric Acid, Serum: 6.1 mg/dL (ref 4.0–7.8)

## 2010-10-05 ENCOUNTER — Ambulatory Visit: Payer: Medicare Other | Admitting: Internal Medicine

## 2010-10-08 NOTE — Assessment & Plan Note (Addendum)
Pt with bilateral first MT joint pain.  I doubt symptoms related to embolic process from recent vascular procedure.  No change in toe color and no other joint pain besides 1st great toe. Use prednisone as directed Pt to monitor for hyperglycemia Patient advised to call office if symptoms persist or worsen.

## 2010-10-22 NOTE — Op Note (Signed)
NAME:  KNOWLEDGE, [name] NO.:  [id number]  MEDICAL RECORD NO.:  [id number]           PATIENT TYPE:  O  LOCATION:  [facility]SC                         FACILITY:  [facility]  PHYSICIAN:  Samuel [name] IV, MDDATE OF BIRTH:  Jul 14, 1945  DATE OF PROCEDURE:  09/25/2010 DATE OF DISCHARGE:                              OPERATIVE REPORT   PREOPERATIVE DIAGNOSIS:  Bilateral claudication.  POSTOPERATIVE DIAGNOSIS:  Bilateral claudication.  PROCEDURE PERFORMED: 1. Ultrasound access right femoral artery. 2. Abdominal aortogram. 3. Bilateral lower extremity runoff. 4. Stent left superficial femoral and popliteal artery.  INDICATIONS:  This is a 65 year old gentleman with lifestyle-limiting claudication, left greater than right.  Ultrasound reveals he has a known left superficial femoral occlusion.  He comes in today for arteriogram and possible intervention.  PROCEDURE:  The patient was identified in the holding area and taken to room 8 and placed supine on the table.  Both groins were prepped and draped in usual fashion.  Time-out was called.  The right femoral artery was accessed under ultrasound guidance.  With an 18-gauge needle, an 0.035 wire was advanced into the aorta.  Under fluoroscopic visualization, a 5-French sheath was placed over the wire and Omni flush catheter was advanced to the level of L1.  Abdominal aortogram was obtained.  Next, using Omni flush catheter and Glidewire, the aortic bifurcation was crossed.  The catheter was placed in the left external iliac artery and left leg runoff was performed.  Right leg runoff images were obtained through retrograde sheath injection.  FINDINGS:  Aortogram:  The visualized portions of the suprarenal abdominal aorta showed no significant disease.  The infrarenal abdominal aorta is patent.  There is aneurysmal degeneration of the infrarenal aorta.  Bilateral renal arteries are patent.  Bilateral common iliac arteries are  patent without significant stenosis.  Heavily calcified. Bilateral internal iliac arteries are patent.  Bilateral external iliac arteries are patent.  Left lower extremity:  The left common femoral artery is widely patent. The left profunda femoral artery is widely patent.  Within the distal left common and proximal superficial femoral, there is an area of plaque with luminal narrowing, however, this does not appear hemodynamically significant.  The superficial femoral artery occludes in the mid thigh with reconstitution of the above-knee popliteal artery.  There was three- vessel runoff to the left leg.  Right lower extremity:  The right common femoral artery is calcified but patent.  There is mild right profunda femoral stenosis.  The right superficial femoral artery is patent throughout its course.  There is mild disease in the adductor canal.  Luminal irregularity is seen in the popliteal artery behind the knee, however, this was without significant stenosis.  There is a lesion at the origin of the anterior tibial arterywhich occludes in the mid calf, peroneal and posterior tibial artery with a dominant runoff to the foot.  INTERVENTION:  At this point in time, the decision was made to intervene.  Over an Amplatz superstiff wire, a 7-French Ansel 1 sheath was advanced over the bifurcation into the left external iliac artery. The patient was given systemic heparinization.  Subintimal  recanalization was performed using a Glidewire and a Quick-Cross catheter, I confirmed successful reentry with a contrast injection with the catheter in the popliteal artery.  A Rosen wire was then placed. The subintimal tract was predilated using a 4 x 120 balloon.  I then elected to primary stent the proximal popliteal and superficial femoral artery.  I did this using 37 x 100 Abbott Absolute Pro stent until a completion arteriogram was performed with runoff which showed widely patent superficial  femoral artery.  There was disease proximal to the most proximal stent.  The runoff was unchanged.  I elected to place an additional stent to treat the area proximal to the most proximal stent, I put a 7 x 60 stent there to confirmation with a 6 balloon.  Completion study showed widely patent superficial femoral popliteal artery with three-vessel runoff.  There is luminal irregularity with mild stenosis at the origin of the superficial femoral artery, however, I did not feel this warranted treatment at this time.  With this, I decided to terminated the procedure.  Catheters and wires were removed.  The patient was taken to the holding area for sheath pull.  IMPRESSION: 1. Successful subintimal recanalization of the left superficial     femoral and popliteal artery with primary stenting using 7-mm     Absolute Pro self-expanding stent. 2. Three-vessel runoff to the left leg. 3. Two-vessel runoff to the right leg. 4. Infrarenal aneurysm.     Samuel Ny, MD     VWB/MEDQ  D:  09/25/2010  T:  09/25/2010  Job:  [id number]  Electronically Signed by Arelia Longest IV MD on 10/22/2010 10:57:37 PM

## 2010-10-26 NOTE — Op Note (Signed)
NAME:  [name], [name] NO.:  [id number]   MEDICAL RECORD NO.:  [id number]          PATIENT TYPE:  INP   LOCATION:  2899                         FACILITY:  [facility]   PHYSICIAN:  Balinda Quails, M.D.    DATE OF BIRTH:  03-Mar-1946   DATE OF PROCEDURE:  05/31/2005  DATE OF DISCHARGE:                                 OPERATIVE REPORT   SURGEON:  Denman George, MD   ASSISTANT:  Gershon Crane, P.A.   ANESTHETIC:  General endotracheal.   PREOPERATIVE DIAGNOSIS:  Severe right internal carotid artery stenosis.   POSTOPERATIVE DIAGNOSIS:  Severe right internal carotid artery stenosis.   PROCEDURE:  Right carotid endarterectomy, Dacron patch angioplasty.   CLINICAL NOTE:  Samuel [name] is a 65 year old male referred CVTS for  evaluation of severe right internal carotid artery stenosis.  Evaluation  including carotid Doppler evaluation and CT angiogram verified plaque at the  right carotid bifurcation with a severe stenosis.  He is consented for right  carotid endarterectomy for reduction of stroke risk.  Risks and benefits of  the operative procedure explained to the patient in detail.  Major morbidity  and mortality associated with the procedure 1-2%.   OPERATIVE PROCEDURE:  The patient brought to the operating room in stable  condition.  Placed under general endotracheal anesthesia.  Foley catheter,  arterial line in place.  Right neck prepped and draped in a sterile fashion.   A curvilinear skin incision made on the anterior border of the right  sternomastoid muscle. Subcutaneous tissue and platysma divided with  electrocautery.  Deep dissection carried down to expose the common carotid  artery at the omohyoid muscle.  The common carotid artery was encircled with  a vessel loop.  The artery exposed distally up to the carotid bifurcation.  The superior thyroid and external carotid were freed and encircled with  vessel loops.  The vagus nerve reflected posteriorly and  preserved.  Distal  dissection carried along the internal carotid artery up to the posterior  belly of the digastric muscle.  The distal internal carotid artery encircled  with a vessel loop.  The vagus nerve identified clearly, the hypoglossal  nerve also identified clearly and preserved.   The patient administered 7000 units of heparin intravenously.  The carotid  bifurcation did reveal extensive heterogeneous plaque extending into the  right internal carotid artery origin.  The carotid vessels controlled with  clamps.  A longitudinal arteriotomy made in the distal common carotid  artery.  The arteriotomy extended across the carotid bulb and up into the  internal carotid artery.  There was a large amount of plaque occupying the  bulb and proximal right internal carotid artery with a high-grade stenosis.  There was hemorrhage in the plaque and ulceration.   A shunt was then inserted.  The plaque then removed with an endarterectomy  elevator.  The endarterectomy carried down into the common carotid artery,  where the plaque was divided transversely with Potts scissors.  The plaque  then raised up into the bulb and the superior thyroid and external carotid  were endarterectomized using  an eversion technique.  The distal internal  carotid artery plaque then feathered out.  Fragments of plaque removed with  fine forceps.  The site irrigated with heparin and saline solution.   Patch angioplasty of the endarterectomy site then carried out with a running  6-0 Prolene suture using a Dacron patch. At completion of the Dacron patch  angioplasty, the shunt was removed.  All vessels well-flushed.  Initial  antegrade flow then directed up the external carotid artery.  Internal  carotid artery then released.  There was an excellent pulse and Doppler  signal in the distal internal carotid artery.   The patient administered 50 mg of protamine intravenously.  Adequate  hemostasis obtained.  Sponge and  instrument counts correct.   The sternomastoid fascia closed with running 2-0 Vicryl suture.  Platysma  closed with running 3-0 Vicryl suture.  Skin closed 4-0 Monocryl.  Steri-  Strips applied.   The patient tolerated the procedure well.  No apparent complications.  Transferred to the recovery room in stable condition.      Balinda Quails, M.D.  Electronically Signed     PGH/MEDQ  D:  05/31/2005  T:  06/03/2005  Job:  [id number]   cc:   Thomos Lemons, D.O. [facility]  [address] [place], [place] [address]   Salvadore Farber, M.D. [facility]  [address]  [address]  [place]  [place] [address]

## 2010-10-26 NOTE — Discharge Summary (Signed)
NAME:  Samuel [name], Samuel [name] NO.:  [id number]   MEDICAL RECORD NO.:  [id number]           PATIENT TYPE:   LOCATION:                                 FACILITY:   PHYSICIAN:  Balinda Quails, M.D.    DATE OF BIRTH:  Jun 29, 1945   DATE OF ADMISSION:  DATE OF DISCHARGE:                               DISCHARGE SUMMARY   FINAL DIAGNOSIS:  Right internal carotid artery stenosis.   SECONDARY DIAGNOSES:  1. Hyperlipidemia.  2. Hypertension.  3. Status post knee surgery 1962.  4. Status post appendectomy in 1960.   IN-HOSPITAL OPERATIONS AND PROCEDURES:  Right carotid endarterectomy  with Dacron patch angioplasty.   HISTORY AND PHYSICAL AND HOSPITAL COURSE:  The patient is a 65 year old  male referred for evaluation of severe right internal carotid artery  stenosis.  Evaluation including carotid Doppler evaluation and CT  angiogram verified plaque at the right carotid bifurcation with severe  stenosis.  The patient was seen and evaluated by Dr. Madilyn Fireman.  Dr. Madilyn Fireman  discussed with the patient undergoing right carotid endarterectomy.  He  discussed risks and benefits of the procedure with the patient.  The  patient acknowledged understanding and agreed to proceed.  Surgery was  scheduled for May 31, 2005.  For details of the patient's past  medical history and physical exam, please see dictated H&P.   The patient was taken to the operating room May 31, 2005, where he  underwent right carotid endarterectomy with Dacron patch angioplasty.  The patient tolerated this procedure and was transferred to the  intensive care unit in stable condition.  The patient's postoperative  course was pretty much unremarkable.  The patient's postoperative day  #1, vital signs were noted to be stable.  He was afebrile.  He was  saturating greater than 90% in room air.  The patient remained  hemodynamically stable.  He had a white count of 12.8, hemoglobin of  11.8, hematocrit of 33.3,  glucose of 200, sodium of 138, potassium 3.9,  chloride of 107, bicarb of 24, BUN of 11, creatinine of 1.0, glucose of  142.  Postoperatively, the patient's pulmonary status was stable.  He  was noted to be in normal sinus rhythm.  Incisions were clean, dry and  intact and healing well.  Neuro remained intact postoperatively.  The  patient was out of bed ambulating well.  He was tolerating diet well; no  nausea, vomiting, or dysphagia.  The patient was voiding without  difficulties.   The patient was discharged home on June 01, 2005, postoperative day  #1.   FOLLOWUP APPOINTMENTS:  A followup appointment was arranged to follow up  with Dr. Madilyn Fireman June 13, 2005, at 12:10 p.m.   ACTIVITY:  The patient instructed no driving until released to do so, no  lifting over 10 pounds.  He was told to ambulate 3-4 times per day,  progress as tolerated, and his continue breathing exercises.   INCISIONAL CARE:  The patient was told to shower, washing his incisions  using soap and water.  He is contact the office if he develops  any  drainage or opening from any of his incision sites.   DIET:  The patient educated on diet to be low fat, low salt.   DISCHARGE MEDICATIONS:  1. Tylox one to two tablets q.4-6h. p.r.n. pain.  2. Lipitor 40 mg daily.  3. Lisinopril/hydrochlorothiazide 10/12.5 daily.  4. Aspirin 81 mg daily.  5. Multivitamins daily.      Stephanie Acre Dominick, PA      P. Liliane Bade, M.D.  Electronically Signed    KMD/MEDQ  D:  12/04/2006  T:  12/04/2006  Job:  [id number]

## 2010-11-06 ENCOUNTER — Encounter (INDEPENDENT_AMBULATORY_CARE_PROVIDER_SITE_OTHER): Payer: Medicare Other

## 2010-11-06 ENCOUNTER — Ambulatory Visit (INDEPENDENT_AMBULATORY_CARE_PROVIDER_SITE_OTHER): Payer: Medicare Other | Admitting: Vascular Surgery

## 2010-11-06 DIAGNOSIS — I714 Abdominal aortic aneurysm, without rupture: Secondary | ICD-10-CM

## 2010-11-06 DIAGNOSIS — I739 Peripheral vascular disease, unspecified: Secondary | ICD-10-CM

## 2010-11-06 DIAGNOSIS — Z48812 Encounter for surgical aftercare following surgery on the circulatory system: Secondary | ICD-10-CM

## 2010-11-06 DIAGNOSIS — I70219 Atherosclerosis of native arteries of extremities with intermittent claudication, unspecified extremity: Secondary | ICD-10-CM

## 2010-11-07 NOTE — Assessment & Plan Note (Signed)
OFFICE VISIT  Samuel [name], Samuel [name] DOB:  [birth year]                                       11/06/2010 ZDGLO#:[id number]  The patient returns 6 weeks post recanalization with PTA and stenting of the left superficial femoral and proximal popliteal artery by Dr. Myra Gianotti on April 17 for severe claudication of the left leg.  The patient's claudication symptoms have been completely relieved, now being able to walk any distance he likes and also to climb stairs rapidly.  He has no claudication in the contralateral right leg.  Today I reviewed the angiogram, which revealed an excellent result on the left leg with three-vessel runoff.  He does have some mild to moderate disease in the distal right superficial femoral artery, which is not significantly stenotic at this time.  Today we checked lower extremity ABIs bilaterally, which I reviewed and interpreted.  He has triphasic flow with greater than 1.0 ABIs in both legs.  We also did a duplex scan of his abdominal aorta, which has a very small aneurysm which today measures 3.6 x 3.55 cm in maximum diameter.  PHYSICAL EXAM TODAY:  Blood pressure 138/67, heart rate 77, respiratory rate is 18.  General:  He is a well-developed, well-nourished male in no apparent distress.  Lower extremity exam reveals 3+ femoral, popliteal, dorsalis pedis, posterior tibial pulses palpable bilaterally.  I reassured him regarding these findings.  He will take Plavix 75 mg a day for 1 more month and then revert to aspirin 81 mg daily and we will follow him on a regular basis in the vascular lab for patency of the stent as well as the small aortic aneurysm.  He also had a carotid performed by me 7 years ago on the right, which is being followed with occasional carotid duplex exams apparently at [facility].    Quita Skye Hart [place], M.D. Electronically Signed  JDL/MEDQ  D:  11/06/2010  T:  11/07/2010  Job:  [id number]

## 2010-11-12 NOTE — Procedures (Unsigned)
DUPLEX ULTRASOUND OF ABDOMINAL AORTA  INDICATION:  AAA.  HISTORY: Diabetes:  No. Cardiac:  No. Hypertension:  Yes. Smoking:  Previous. Connective Tissue Disorder: Family History:  No. Previous Surgery:  No. Other:  Hyperlipidemia.  DUPLEX EXAM:         AP (cm)                   TRANSVERSE (cm) Proximal             2.17 cm                   2.17 cm Mid                  3.55 cm                   3.61 cm Distal               2.54 cm                   2.57 cm Right Iliac          Not visualized            Not visualized Left Iliac           Not visualized            Not visualized  PREVIOUS:  Date:  AP:  TRANSVERSE:  IMPRESSION:  Abdominal aortic aneurysm noticed with largest measurement of 3.55 x 3.61 cm.  ___________________________________________ Quita Skye Hart [place], M.D.  EM/MEDQ  D:  11/06/2010  T:  11/06/2010  Job:  [id number]

## 2010-11-28 ENCOUNTER — Telehealth: Payer: Self-pay | Admitting: Internal Medicine

## 2010-11-28 MED ORDER — LISINOPRIL-HYDROCHLOROTHIAZIDE 10-12.5 MG PO TABS: 1.0000 | ORAL_TABLET | Freq: Every day | ORAL | Status: DC

## 2010-11-28 NOTE — Telephone Encounter (Signed)
Pt states he needs refill of lisinopril sent to [facility].

## 2010-11-28 NOTE — Telephone Encounter (Signed)
Rx refill sent to pharmacy. 

## 2011-01-01 ENCOUNTER — Other Ambulatory Visit: Payer: Self-pay | Admitting: Internal Medicine

## 2011-01-01 NOTE — Telephone Encounter (Signed)
Rx refill denied office visit needed. 

## 2011-01-08 ENCOUNTER — Telehealth: Payer: Self-pay | Admitting: Internal Medicine

## 2011-01-08 NOTE — Telephone Encounter (Signed)
Patient called in asking why his prednisone refill was not sent in. I told him that he needed to have an ov(see previous note) before the doctor would refill. Patient got very upset over this. I offered him an appt for today or tomorrow to come in. Patient declined both. He stated that it takes 30 min to get to our office so I offered for him to see another [facility] doctor that was closer to him. Patient stated that he would just go see another "jack in the box" doctor to get refill. Patient also stated that "someone will be hurting" if this rx does not get refilled. Patient wanted a nurse to call but I told him that the nurse would say the same thing.

## 2011-02-06 ENCOUNTER — Other Ambulatory Visit (INDEPENDENT_AMBULATORY_CARE_PROVIDER_SITE_OTHER): Payer: Medicare Other

## 2011-02-06 ENCOUNTER — Ambulatory Visit (INDEPENDENT_AMBULATORY_CARE_PROVIDER_SITE_OTHER): Payer: Medicare Other

## 2011-02-06 DIAGNOSIS — I739 Peripheral vascular disease, unspecified: Secondary | ICD-10-CM

## 2011-02-06 DIAGNOSIS — Z48812 Encounter for surgical aftercare following surgery on the circulatory system: Secondary | ICD-10-CM

## 2011-02-06 NOTE — Procedures (Unsigned)
LOWER EXTREMITY ARTERIAL DUPLEX  INDICATION:  Follow up left SFA stent placed 09/20/10.  HISTORY: Diabetes:  No. Cardiac:  No. Hypertension:  Yes. Smoking:  Previous. Previous Surgery:  SINGLE LEVEL ARTERIAL EXAM                         RIGHT                LEFT Brachial: Anterior tibial: Posterior tibial: Peroneal: Ankle/Brachial Index:  LOWER EXTREMITY ARTERIAL DUPLEX EXAM  PREVIOUS ANKLE BRACHIAL INDEX RIGHT:  11/06/2010, 1.06  PREVIOUS ANKLE BRACHIAL INDEX LEFT:  11/06/2010, 1.15  DUPLEX: 1. Widely patent left SFA stent without evidence of restenosis or     hyperplasia. 2. Triphasic waveforms are observed throughout the stent. 3. See diagram for velocity details.  IMPRESSION:  Patent left superficial femoral artery stent, as described above.  ___________________________________________ Quita Skye. Hart Rochester, M.D.  LT/MEDQ  D:  02/06/2011  T:  02/06/2011  Job:  [id number]

## 2011-02-18 ENCOUNTER — Ambulatory Visit (INDEPENDENT_AMBULATORY_CARE_PROVIDER_SITE_OTHER): Payer: Medicare Other | Admitting: Internal Medicine

## 2011-02-18 ENCOUNTER — Encounter: Payer: Self-pay | Admitting: Internal Medicine

## 2011-02-18 ENCOUNTER — Ambulatory Visit: Payer: BLUE CROSS/BLUE SHIELD | Admitting: Internal Medicine

## 2011-02-18 DIAGNOSIS — I10 Essential (primary) hypertension: Secondary | ICD-10-CM

## 2011-02-18 DIAGNOSIS — I739 Peripheral vascular disease, unspecified: Secondary | ICD-10-CM

## 2011-02-18 DIAGNOSIS — R7309 Other abnormal glucose: Secondary | ICD-10-CM

## 2011-02-18 DIAGNOSIS — E785 Hyperlipidemia, unspecified: Secondary | ICD-10-CM

## 2011-02-18 DIAGNOSIS — M109 Gout, unspecified: Secondary | ICD-10-CM

## 2011-02-18 MED ORDER — PREDNISONE 10 MG PO TABS: ORAL_TABLET | ORAL | Status: DC

## 2011-02-18 NOTE — Progress Notes (Signed)
Subjective:    Patient ID: Samuel [name], male    DOB: [birth year], 65 y.o.   MRN: [id number]  HPI  65 y/o white male with PAD, HTN, and hyperlipidemia for f/u.  Overall doing well.  Very busy with his [employer] business.   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 Hot day.  Heart rate was elevated but pt tolerated w/o chest pain.  Good med compliance with antihypertensives and lipitor.    PAD - he had recent f/u with vascular specialist.  Left leg doing well.  Last gout flare in his feet responded well to prednisone.  He rarely gets gout attack but would like to have rx of prednisone on hand for gout flares  Review of Systems negative for chest pain or SOB   Past Medical History  Diagnosis Date  . Hyperlipidemia   . Hypertension   . History of carotid stenosis     s/p right carotid endarterectomy 12/06  . Genital warts     history of    History   Social History  . Marital Status: Married    Spouse Name: N/A    Number of Children: N/A  . Years of Education: N/A   Occupational History  . Not on file.   Social History Main Topics  . Smoking status: Former Games developer  . Smokeless tobacco: Not on file   Comment: quit- for porlonged periods he has smoked 2-3 packs a day  . Alcohol Use: Not on file  . Drug Use: Not on file  . Sexually Active: Not on file   Other Topics Concern  . Not on file   Social History Narrative   Married -to his second wife.  Has a daughter from his first marriage who is in her early 104s.  He drinks approximately 4 alcoholic beverages, beer, a day.  He has done so since age 65.  Denies any binge drinking or heavy alcohol use.  Quit tobacco 2 year ago.  He has smoked on and off x20-30 years.  For prolonged periods he has smoked 2-3 packs a day.  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 Knee surgery 1962  . Appendectomy 1960  . Carotid endarterectomy 05/2005    right    Family History    Problem Relation Age of Onset  . Stroke Mother     deceased age 110 had her firsrt CVA age  96  . Peripheral vascular disease Brother   . Peripheral vascular disease Father     status post leg amputation  secondary to PVD    No Known Allergies  Current Outpatient Prescriptions on File Prior to Visit  Medication Sig Dispense Refill  . aspirin 81 MG tablet Take 81 mg by mouth daily.        Marland Kitchen atorvastatin (LIPITOR) 80 MG tablet Take 80 mg by mouth daily.        Marland Kitchen lisinopril-hydrochlorothiazide (PRINZIDE,ZESTORETIC) 10-12.5 MG per tablet Take 1 tablet by mouth daily.  90 tablet  1    BP 134/80  Pulse 72  Temp(Src) 98.4 F (36.9 C) (Oral)  Ht 5' 10.5" (1.791 m)  Wt 196 lb (88.905 kg)  BMI 27.73 kg/m2    Objective:   Physical Exam   Constitutional: Appears well-developed and well-nourished. No distress.  Neck: Normal range of motion. Neck supple. No thyromegaly present. No carotid bruit Cardiovascular: Normal rate, regular rhythm and  normal heart sounds.  Exam reveals no gallop and no friction rub.   No murmur heard. Pulmonary/Chest: Effort normal and breath sounds normal.  No wheezes. No rales.  Abdominal: Soft. Bowel sounds are normal. No mass. There is no tenderness.  Neurological: Alert. No cranial nerve deficit.  Skin: Skin is warm and dry.  Psychiatric: Normal mood and affect. Behavior is normal.       Assessment & Plan:

## 2011-02-18 NOTE — Assessment & Plan Note (Addendum)
S/P recanalization with PTA and stenting of  the left superficial femoral and proximal popliteal artery by Dr.  Myra Gianotti on April 17 for severe claudication of the left leg.  02/06/2011 DUPLEX:   1. Widely patent left SFA stent without evidence of restenosis or       hyperplasia.   2. Triphasic waveforms are observed throughout the stent.   3. See diagram for velocity details.      IMPRESSION:  Patent left superficial femoral artery stent, as described   above.

## 2011-02-18 NOTE — Assessment & Plan Note (Signed)
Isolated elevated reading in office.   Pt reports home SBP readings in the 120's. BP: 134/80 mmHg  Continue current meds Lab Results  Component Value Date   CREATININE 1.0 10/02/2010

## 2011-02-18 NOTE — Assessment & Plan Note (Signed)
Continue aggressive mgt.  Goal LDL < 70.  Obtain FLP Lab Results  Component Value Date   CHOL 151 10/09/2009   CHOL 158 10/19/2008   CHOL 137 12/23/2007   Lab Results  Component Value Date   HDL 38.30* 10/09/2009   HDL 16.10 10/19/2008   HDL 96.0* 12/23/2007   Lab Results  Component Value Date   LDLCALC 70 12/23/2007   LDLCALC 64 08/07/2006   Lab Results  Component Value Date   TRIG 277.0* 10/09/2009   TRIG 224.0* 10/19/2008   TRIG 150* 12/23/2007   Lab Results  Component Value Date   CHOLHDL 4 10/09/2009   CHOLHDL 4 10/19/2008   CHOLHDL 3.7 CALC 12/23/2007   Lab Results  Component Value Date   LDLDIRECT 59.6 10/09/2009   LDLDIRECT 84.7 10/19/2008

## 2011-02-18 NOTE — Assessment & Plan Note (Signed)
Wt stable.  Monitor A1c

## 2011-02-18 NOTE — Assessment & Plan Note (Signed)
Good response to predisone as last flare.   Rx provided for future attacks.  Pt understands to use sparingly and to monitor blood sugars.

## 2011-02-19 ENCOUNTER — Encounter: Payer: Self-pay | Admitting: Vascular Surgery

## 2011-02-28 ENCOUNTER — Other Ambulatory Visit: Payer: Self-pay | Admitting: Vascular Surgery

## 2011-02-28 DIAGNOSIS — I739 Peripheral vascular disease, unspecified: Secondary | ICD-10-CM

## 2011-02-28 DIAGNOSIS — Z48812 Encounter for surgical aftercare following surgery on the circulatory system: Secondary | ICD-10-CM

## 2011-03-08 ENCOUNTER — Ambulatory Visit: Payer: Medicare Other

## 2011-03-08 DIAGNOSIS — E785 Hyperlipidemia, unspecified: Secondary | ICD-10-CM

## 2011-03-08 DIAGNOSIS — R7309 Other abnormal glucose: Secondary | ICD-10-CM

## 2011-03-08 LAB — LIPID PANEL
Cholesterol: 153 mg/dL (ref 0–200)
HDL: 54.8 mg/dL (ref >39.00)
Triglycerides: 147 mg/dL (ref 0.0–149.0)
VLDL: 29.4 mg/dL (ref 0.0–40.0)

## 2011-03-08 LAB — BASIC METABOLIC PANEL
CO2: 26 mEq/L (ref 19–32)
Calcium: 9.5 mg/dL (ref 8.4–10.5)
Creatinine, Ser: 1 mg/dL (ref 0.4–1.5)
GFR: 82.02 mL/min (ref >60.00)
Glucose, Bld: 80 mg/dL (ref 70–99)
Sodium: 140 mEq/L (ref 135–145)

## 2011-03-08 LAB — HEPATIC FUNCTION PANEL
Albumin: 4.4 g/dL (ref 3.5–5.2)
Alkaline Phosphatase: 52 U/L (ref 39–117)
Total Protein: 7.7 g/dL (ref 6.0–8.3)

## 2011-03-08 LAB — HEMOGLOBIN A1C: Hgb A1c MFr Bld: 6.1 % (ref 4.6–6.5)

## 2011-03-15 ENCOUNTER — Encounter: Payer: Self-pay | Admitting: Internal Medicine

## 2011-04-29 ENCOUNTER — Other Ambulatory Visit: Payer: Self-pay | Admitting: Internal Medicine

## 2011-04-29 MED ORDER — ATORVASTATIN CALCIUM 80 MG PO TABS: 80.0000 mg | ORAL_TABLET | Freq: Every day | ORAL | Status: DC

## 2011-04-29 NOTE — Telephone Encounter (Signed)
rx sent in electronically 

## 2011-04-29 NOTE — Telephone Encounter (Signed)
Pt need refill generic lipitor 80mg  #90 with 3 refills fax to [facility] [phone]

## 2011-05-07 ENCOUNTER — Telehealth: Payer: Self-pay | Admitting: Internal Medicine

## 2011-05-07 NOTE — Telephone Encounter (Signed)
Pt stated atorvastatin has been recalled. Please verify and call pt back.

## 2011-05-07 NOTE — Telephone Encounter (Signed)
Please call pt's pharmacy to confirm atorvastatin was recalled.  If yes, change to Crestor 20 mg  # 90 one po qd.  Refill x 1.

## 2011-05-07 NOTE — Telephone Encounter (Signed)
Called [facility] and there is a brand of atorvastatin that was recalled.  [facility] "[facility]  [facility]ed [facility] a rx to [facility] but pt states he uses [facility].  Pt is going to call [facility] and verify that his "brand" of atorvastatin is ok and if not then he will call us back to get a new rx.

## 2011-05-14 NOTE — Telephone Encounter (Signed)
What is status of lipitor prescription?

## 2011-05-14 NOTE — Telephone Encounter (Signed)
Pt got rx from [facility]

## 2011-05-15 ENCOUNTER — Telehealth: Payer: Self-pay | Admitting: Internal Medicine

## 2011-05-15 MED ORDER — ATORVASTATIN CALCIUM 80 MG PO TABS: 80.0000 mg | ORAL_TABLET | Freq: Every day | ORAL | Status: DC

## 2011-05-15 NOTE — Telephone Encounter (Signed)
Pt has questions regarding his atorvastatin (LIPITOR) 80 MG tablet pt stated that they have been recalled and wants to know what to do. Please contact

## 2011-05-15 NOTE — Telephone Encounter (Signed)
[facility] brand that was not recalled.  rx sent in electronically

## 2011-06-11 ENCOUNTER — Other Ambulatory Visit: Payer: Self-pay | Admitting: Internal Medicine

## 2011-08-19 ENCOUNTER — Ambulatory Visit (INDEPENDENT_AMBULATORY_CARE_PROVIDER_SITE_OTHER): Payer: Medicare Other | Admitting: Internal Medicine

## 2011-08-19 ENCOUNTER — Encounter: Payer: Self-pay | Admitting: Internal Medicine

## 2011-08-19 DIAGNOSIS — I739 Peripheral vascular disease, unspecified: Secondary | ICD-10-CM

## 2011-08-19 DIAGNOSIS — R7309 Other abnormal glucose: Secondary | ICD-10-CM

## 2011-08-19 DIAGNOSIS — I10 Essential (primary) hypertension: Secondary | ICD-10-CM

## 2011-08-19 LAB — BASIC METABOLIC PANEL
BUN: 17 mg/dL (ref 6–23)
CO2: 30 mEq/L (ref 19–32)
Chloride: 99 mEq/L (ref 96–112)
Creatinine, Ser: 1.4 mg/dL (ref 0.4–1.5)
Glucose, Bld: 93 mg/dL (ref 70–99)
Potassium: 4 mEq/L (ref 3.5–5.1)

## 2011-08-19 LAB — HEMOGLOBIN A1C: Hgb A1c MFr Bld: 6 % (ref 4.6–6.5)

## 2011-08-19 MED ORDER — ATORVASTATIN CALCIUM 80 MG PO TABS: 80.0000 mg | ORAL_TABLET | Freq: Every day | ORAL | Status: DC

## 2011-08-19 MED ORDER — LISINOPRIL-HYDROCHLOROTHIAZIDE 10-12.5 MG PO TABS: 1.0000 | ORAL_TABLET | Freq: Every day | ORAL | Status: DC

## 2011-08-19 NOTE — Assessment & Plan Note (Signed)
Low carb diet encouraged.  Patient understands to avoid sweets and sugary beverages.  Monitor A1c.

## 2011-08-19 NOTE — Assessment & Plan Note (Addendum)
Stable.  Continue risk factor modification and daily aspirin.

## 2011-08-19 NOTE — Patient Instructions (Signed)
Please complete the following lab tests before your next follow up appointment: BMET, A1c - 401.9, 790.29 LFTs, Lipid panel - 272.4 PSA - v76.44

## 2011-08-19 NOTE — Assessment & Plan Note (Signed)
Well controlled.  Continue current medication regimen. BP: 116/72 mmHg

## 2011-08-19 NOTE — Progress Notes (Signed)
Subjective:    Patient ID: Samuel [name], male    DOB: 1946/04/01, 66 y.o.   MRN: [id number]  HPI  66 year old white male with history of hypertension, hyperlipidemia, peripheral vascular disease for routine followup. No significant interval history. Patient very busy with his [employer] business. He has been compliant with his medications. He denies any symptoms of claudication of left leg.  Abnormal glucose-weight is stable.  Fair diet.  Hypertension - stable.  Good medication compliance. Review of Systems    negative for chest pain,  Negative for shortness of breath  Past Medical History  Diagnosis Date  . Hyperlipidemia   . Hypertension   . History of carotid stenosis     s/p right carotid endarterectomy 12/06  . Genital warts     history of  . PAD (peripheral artery disease)     Left leg    History   Social History  . Marital Status: Married    Spouse Name: N/A    Number of Children: N/A  . Years of Education: N/A   Occupational History  .      self employed - [employer]   Social History Main Topics  . Smoking status: Former Games developer  . Smokeless tobacco: Not on file   Comment: quit- for porlonged periods he has smoked 2-3 packs a day  . Alcohol Use: Yes  . Drug Use: No  . Sexually Active: Not on file   Other Topics Concern  . Not on file   Social History Narrative   Married -to his second wife [name]).  Has a daughter from his first marriage who is in her early 58s.  He drinks approximately 4 alcoholic beverages, beer, a day.  He has done so since age 43.  Denies any binge drinking or heavy alcohol use.  Quit tobacco 2 year ago.  He has smoked on and off x20-30 years.  For prolonged periods he has smoked 2-3 packs a day.  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  She lives in [place], [place]    Past Surgical History  Procedure Date  . Knee surgery 1962  . Appendectomy 1960  . Carotid endarterectomy 05/2005      right  . Left leg stents     Family History  Problem Relation Age of Onset  . Stroke Mother     deceased age 26 had her firsrt CVA age  65  . Peripheral vascular disease Brother   . Peripheral vascular disease Father     status post leg amputation  secondary to PVD    No Known Allergies  Current Outpatient Prescriptions on File Prior to Visit  Medication Sig Dispense Refill  . aspirin 81 MG tablet Take 81 mg by mouth daily.        . vardenafil (LEVITRA) 20 MG tablet Take 20 mg by mouth daily as needed.          BP 116/72  Pulse 84  Temp(Src) 97.6 F (36.4 C) (Oral)  Ht 5\' 10"  (1.778 m)  Wt 198 lb (89.812 kg)  BMI 28.41 kg/m2    Objective:   Physical Exam  Constitutional: He appears well-developed and well-nourished.  Neck: Neck supple.       No carotid bruit  Cardiovascular: Normal rate, regular rhythm and normal heart sounds.        Diminished PT pulse left foot.  Faint PD pulse left foot  Pulmonary/Chest: Effort normal and breath  sounds normal. He has no wheezes. He has no rales.  Skin: Skin is warm and dry.  Psychiatric: He has a normal mood and affect. His behavior is normal.      Assessment & Plan:

## 2011-08-21 NOTE — Progress Notes (Signed)
Quick Note:  Left a message for pt to return call. ______ 

## 2011-08-29 ENCOUNTER — Other Ambulatory Visit: Payer: Self-pay | Admitting: *Deleted

## 2011-08-29 DIAGNOSIS — I739 Peripheral vascular disease, unspecified: Secondary | ICD-10-CM

## 2011-09-05 ENCOUNTER — Ambulatory Visit (INDEPENDENT_AMBULATORY_CARE_PROVIDER_SITE_OTHER): Payer: Medicare Other | Admitting: Vascular Surgery

## 2011-09-05 ENCOUNTER — Other Ambulatory Visit: Payer: Medicare Other

## 2011-09-05 DIAGNOSIS — I739 Peripheral vascular disease, unspecified: Secondary | ICD-10-CM

## 2011-09-05 DIAGNOSIS — Z48812 Encounter for surgical aftercare following surgery on the circulatory system: Secondary | ICD-10-CM

## 2011-09-09 NOTE — Procedures (Unsigned)
LOWER EXTREMITY ARTERIAL DUPLEX  INDICATION:  Peripheral vascular disease.  HISTORY: Diabetes:  No Cardiac:  No Hypertension:  Yes Smoking:  Previous Previous Surgery:  Left SFA/popliteal stent on 09/20/2010.  SINGLE LEVEL ARTERIAL EXAM                         RIGHT                LEFT Brachial: Anterior tibial: Posterior tibial: Peroneal: Ankle/Brachial Index:   1.05                 1.01 Previuos ABI  11/06/2010       Rt: 1.06            Lt: 1.15  LOWER EXTREMITY ARTERIAL DUPLEX EXAM  DUPLEX:  Elevated velocities present in the proximal/mid left superficial femoral artery stent suggestive of in-stent stenosis, heterogeneous plaque is visualized and peak systolic velocities range from 260 cm/sec to 397 cm/sec.   IMPRESSION: 1. Left superficial femoral artery stent stenosis present. 2. Bilateral ankle brachial indices are greater than 0.95 and     considered in the normal range.  However, there has been a decrease     noted on the left since previous study on 11/06/2010.       ___________________________________________ Samuel [name]. Hart Rochester, M.D.  SH/MEDQ  D:  09/05/2011  T:  09/05/2011  Job:  [id number]

## 2011-09-17 ENCOUNTER — Other Ambulatory Visit: Payer: Self-pay | Admitting: *Deleted

## 2011-09-17 DIAGNOSIS — I739 Peripheral vascular disease, unspecified: Secondary | ICD-10-CM

## 2011-09-17 DIAGNOSIS — Z48812 Encounter for surgical aftercare following surgery on the circulatory system: Secondary | ICD-10-CM

## 2011-09-17 DIAGNOSIS — I714 Abdominal aortic aneurysm, without rupture: Secondary | ICD-10-CM

## 2011-09-18 ENCOUNTER — Ambulatory Visit (INDEPENDENT_AMBULATORY_CARE_PROVIDER_SITE_OTHER): Payer: Medicare Other | Admitting: Internal Medicine

## 2011-09-18 ENCOUNTER — Encounter: Payer: Self-pay | Admitting: Vascular Surgery

## 2011-09-18 DIAGNOSIS — J019 Acute sinusitis, unspecified: Secondary | ICD-10-CM

## 2011-09-18 MED ORDER — CEFUROXIME AXETIL 500 MG PO TABS: 500.0000 mg | ORAL_TABLET | Freq: Two times a day (BID) | ORAL | Status: AC

## 2011-09-18 NOTE — Patient Instructions (Addendum)
Use nasal saline as directed Please call our office if your symptoms do not improve or gets worse.  

## 2011-09-18 NOTE — Progress Notes (Signed)
Subjective:    Patient ID: KAISYN MILLEA, male    DOB: 09/04/1945, 66 y.o.   MRN: [id number]  HPI  66 year old white male complains of left maxillary facial pain and purulent nasal discharge. This has been going on for greater than one week. He denies fever or chills. He notes upper dental pain.  Review of Systems Positive for cough, negative for shortness of breath  Past Medical History  Diagnosis Date  . Hyperlipidemia   . Hypertension   . History of carotid stenosis     s/p right carotid endarterectomy 12/06  . Genital warts     history of  . PAD (peripheral artery disease)     Left leg    History   Social History  . Marital Status: Married    Spouse Name: N/A    Number of Children: N/A  . Years of Education: N/A   Occupational History  .      self employed - [employer]   Social History Main Topics  . Smoking status: Former Games developer  . Smokeless tobacco: Not on file   Comment: quit- for porlonged periods he has smoked 2-3 packs a day  . Alcohol Use: Yes  . Drug Use: No  . Sexually Active: Not on file   Other Topics Concern  . Not on file   Social History Narrative   Married -to his second wife [name]).  Has a daughter from his first marriage who is in her early 65s.  He drinks approximately 4 alcoholic beverages, beer, a day.  He has done so since age 39.  Denies any binge drinking or heavy alcohol use.  Quit tobacco 2 year ago.  He has smoked on and off x20-30 years.  For prolonged periods he has smoked 2-3 packs a day.  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  She lives in [place], [place]    Past Surgical History  Procedure Date  . Knee surgery 1962  . Appendectomy 1960  . Carotid endarterectomy 05/2005    right  . Left leg stents     Family History  Problem Relation Age of Onset  . Stroke Mother     deceased age 65 had her firsrt CVA age  41  . Peripheral vascular disease Brother   . Peripheral  vascular disease Father     status post leg amputation  secondary to PVD    No Known Allergies  Current Outpatient Prescriptions on File Prior to Visit  Medication Sig Dispense Refill  . aspirin 81 MG tablet Take 81 mg by mouth daily.        Marland Kitchen lisinopril-hydrochlorothiazide (PRINZIDE,ZESTORETIC) 10-12.5 MG per tablet Take 1 tablet by mouth daily.  90 tablet  1  . vardenafil (LEVITRA) 20 MG tablet Take 20 mg by mouth daily as needed.        Marland Kitchen DISCONTD: atorvastatin (LIPITOR) 80 MG tablet Take 1 tablet (80 mg total) by mouth daily.  90 tablet  1    BP 120/64  Pulse 80  Temp(Src) 98 F (36.7 C) (Oral)  Wt 195 lb (88.451 kg)       Objective:   Physical Exam  Constitutional: He appears well-developed and well-nourished.  HENT:  Head: Normocephalic.  Right Ear: External ear normal.  Left Ear: External ear normal.  Eyes: Conjunctivae are normal. Pupils are equal, round, and reactive to light.  Cardiovascular: Normal rate, regular rhythm and normal heart sounds.   Pulmonary/Chest:  Effort normal and breath sounds normal. He has no wheezes. He has no rales.       Assessment & Plan:

## 2011-09-18 NOTE — Assessment & Plan Note (Signed)
66 year old white male with signs and symptoms of acute sinusitis. Treat with cefuroxime 500 mg twice daily x10 days. Patient advised to call office if symptoms persist or worsen.

## 2012-02-19 ENCOUNTER — Encounter: Payer: Self-pay | Admitting: Internal Medicine

## 2012-02-19 ENCOUNTER — Ambulatory Visit (INDEPENDENT_AMBULATORY_CARE_PROVIDER_SITE_OTHER): Payer: Medicare Other | Admitting: Internal Medicine

## 2012-02-19 VITALS — BP 136/72 | HR 76 | Temp 98.5°F | Wt 194.0 lb

## 2012-02-19 DIAGNOSIS — R7309 Other abnormal glucose: Secondary | ICD-10-CM

## 2012-02-19 DIAGNOSIS — I10 Essential (primary) hypertension: Secondary | ICD-10-CM

## 2012-02-19 DIAGNOSIS — E785 Hyperlipidemia, unspecified: Secondary | ICD-10-CM

## 2012-02-19 MED ORDER — LISINOPRIL-HYDROCHLOROTHIAZIDE 10-12.5 MG PO TABS: 1.0000 | ORAL_TABLET | Freq: Every day | ORAL | Status: DC

## 2012-02-19 MED ORDER — ATORVASTATIN CALCIUM 80 MG PO TABS: 40.0000 mg | ORAL_TABLET | Freq: Every day | ORAL | Status: DC

## 2012-02-19 NOTE — Assessment & Plan Note (Signed)
Well controlled.  Monitor electrolytes and kidney function.  Pt given rx for lab orders.  He has appt with vascular specialist and was instructed to come fasting to office visit.  BP: 136/72 mmHg

## 2012-02-19 NOTE — Assessment & Plan Note (Signed)
Goal LDL < 70.  Monitor FLP and LFTs

## 2012-02-19 NOTE — Progress Notes (Signed)
Subjective:    Patient ID: Samuel [name], male    DOB: [birth year], 66 y.o.   MRN: [id number]  HPI  66 y/o white with hx of PVD, Htn, and hyperlipidemia for follow up.  Overall, patient doing well.  He is compliant with his medication.  He denies chest pain or shortness of breath.  His weight is stable.  He denies pain in his left leg with exertion.  He has upcoming appt with vascular specialist.   Review of Systems Negative for chest pain  Past Medical History  Diagnosis Date  . Hyperlipidemia   . Hypertension   . History of carotid stenosis     s/p right carotid endarterectomy 12/06  . Genital warts     history of  . PAD (peripheral artery disease)     Left leg    History   Social History  . Marital Status: Married    Spouse Name: N/A    Number of Children: N/A  . Years of Education: N/A   Occupational History  .      self employed - [employer]   Social History Main Topics  . Smoking status: Former Games developer  . Smokeless tobacco: Not on file   Comment: quit- for porlonged periods he has smoked 2-3 packs a day  . Alcohol Use: Yes  . Drug Use: No  . Sexually Active: Not on file   Other Topics Concern  . Not on file   Social History Narrative   Married -to his second wife [name]).  Has a daughter from his first marriage who is in her early 3s.  He drinks approximately 4 alcoholic beverages, beer, a day.  He has done so since age 68.  Denies any binge drinking or heavy alcohol use.  Quit tobacco 2 year ago.  He has smoked on and off x20-30 years.  For prolonged periods he has smoked 2-3 packs a day.  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  She lives in [place], [place]    Past Surgical History  Procedure Date  . Knee surgery 1962  . Appendectomy 1960  . Carotid endarterectomy 05/2005    right  . Left leg stents     Family History  Problem Relation Age of Onset  . Stroke Mother     deceased age 76 had her  firsrt CVA age  53  . Peripheral vascular disease Brother   . Peripheral vascular disease Father     status post leg amputation  secondary to PVD    No Known Allergies  Current Outpatient Prescriptions on File Prior to Visit  Medication Sig Dispense Refill  . aspirin 81 MG tablet Take 81 mg by mouth daily.        . vardenafil (LEVITRA) 20 MG tablet Take 20 mg by mouth daily as needed.        Marland Kitchen DISCONTD: atorvastatin (LIPITOR) 80 MG tablet Take 40 mg by mouth daily.      Marland Kitchen DISCONTD: lisinopril-hydrochlorothiazide (PRINZIDE,ZESTORETIC) 10-12.5 MG per tablet Take 1 tablet by mouth daily.  90 tablet  1    BP 136/72  Pulse 76  Temp 98.5 F (36.9 C) (Oral)  Wt 194 lb (87.998 kg)       Objective:   Physical Exam  Constitutional: He is oriented to person, place, and time. He appears well-developed and well-nourished.  HENT:  Head: Normocephalic and atraumatic.  Neck:       No carotid  bruit  Cardiovascular: Normal rate, regular rhythm and normal heart sounds.        Diminished but palpable PD and PT pulse bilaterally  Pulmonary/Chest: Effort normal. He has no wheezes.  Musculoskeletal: He exhibits no edema.  Neurological: He is alert and oriented to person, place, and time.  Skin: Skin is warm and dry.  Psychiatric: He has a normal mood and affect. His behavior is normal.          Assessment & Plan:

## 2012-02-19 NOTE — Patient Instructions (Addendum)
Please complete the following lab tests before your next follow up appointment: BMET - 401.9 FLP, LFTs - 272.4 A1c - 790.29 

## 2012-02-19 NOTE — Assessment & Plan Note (Addendum)
Monitor A1c.  I encouraged low carb diet and regular exercise.

## 2012-02-24 ENCOUNTER — Ambulatory Visit: Payer: Medicare Other | Admitting: Neurosurgery

## 2012-02-26 ENCOUNTER — Encounter: Payer: Self-pay | Admitting: Neurosurgery

## 2012-02-27 ENCOUNTER — Ambulatory Visit (INDEPENDENT_AMBULATORY_CARE_PROVIDER_SITE_OTHER): Payer: Medicare Other | Admitting: *Deleted

## 2012-02-27 ENCOUNTER — Ambulatory Visit (INDEPENDENT_AMBULATORY_CARE_PROVIDER_SITE_OTHER): Payer: Medicare Other | Admitting: Neurosurgery

## 2012-02-27 ENCOUNTER — Other Ambulatory Visit (INDEPENDENT_AMBULATORY_CARE_PROVIDER_SITE_OTHER): Payer: Medicare Other | Admitting: *Deleted

## 2012-02-27 ENCOUNTER — Encounter (INDEPENDENT_AMBULATORY_CARE_PROVIDER_SITE_OTHER): Payer: Medicare Other | Admitting: *Deleted

## 2012-02-27 ENCOUNTER — Encounter: Payer: Self-pay | Admitting: Neurosurgery

## 2012-02-27 VITALS — BP 148/77 | HR 56 | Resp 16 | Ht 70.0 in | Wt 192.7 lb

## 2012-02-27 DIAGNOSIS — Z48812 Encounter for surgical aftercare following surgery on the circulatory system: Secondary | ICD-10-CM

## 2012-02-27 DIAGNOSIS — I739 Peripheral vascular disease, unspecified: Secondary | ICD-10-CM

## 2012-02-27 DIAGNOSIS — I6529 Occlusion and stenosis of unspecified carotid artery: Secondary | ICD-10-CM

## 2012-02-27 DIAGNOSIS — I714 Abdominal aortic aneurysm, without rupture, unspecified: Secondary | ICD-10-CM

## 2012-02-27 NOTE — Addendum Note (Signed)
Addended by: Amity Roes K on: 02/27/2012 11:42 AM   Modules accepted: Orders  

## 2012-02-27 NOTE — Progress Notes (Signed)
[facility] [place] AAA/Carotid/PAD/PVD Office Note  CC: Annual PAD, AAA, carotid surveillance Referring Physician: Hart Rochester  History of Present Illness: 66 year old male patient of Dr. Hart Rochester who is status post left superficial femoral artery stent was placed by Dr. Myra Gianotti in April 2012, the patient's also under regular surveillance for a small known AAA as well as carotid and is status post a right CEA in December of 2006 by Dr. Hart Rochester. Patient denies any signs or symptoms of CVA, TIA, amaurosis fugax or any neural deficit. The patient has no claudication, he has no rest pain or no open ulcerations on the lower extremities. The patient also denies any unusual abdominal or back pain.  Past Medical History  Diagnosis Date  . Hyperlipidemia   . Hypertension   . History of carotid stenosis     s/p right carotid endarterectomy 12/06  . Genital warts     history of  . PAD (peripheral artery disease)     Left leg  . Carotid artery occlusion   . AAA (abdominal aortic aneurysm)     ROS: [x]  Positive   [ ]  Denies    General: [ ]  Weight loss, [ ]  Fever, [ ]  chills Neurologic: [ ]  Dizziness, [ ]  Blackouts, [ ]  Seizure [ ]  Stroke, [ ]  "Mini stroke", [ ]  Slurred speech, [ ]  Temporary blindness; [ ]  weakness in arms or legs, [ ]  Hoarseness Cardiac: [ ]  Chest pain/pressure, [ ]  Shortness of breath at rest [ ]  Shortness of breath with exertion, [ ]  Atrial fibrillation or irregular heartbeat Vascular: [ ]  Pain in legs with walking, [ ]  Pain in legs at rest, [ ]  Pain in legs at night,  [ ]  Non-healing ulcer, [ ]  Blood clot in vein/DVT,   Pulmonary: [ ]  Home oxygen, [ ]  Productive cough, [ ]  Coughing up blood, [ ]  Asthma,  [ ]  Wheezing Musculoskeletal:  [ ]  Arthritis, [ ]  Low back pain, [ ]  Joint pain Hematologic: [ ]  Easy Bruising, [ ]  Anemia; [ ]  Hepatitis Gastrointestinal: [ ]  Blood in stool, [ ]  Gastroesophageal Reflux/heartburn, [ ]  Trouble swallowing Urinary: [ ]  chronic  Kidney disease, [ ]  on HD - [ ]  MWF or [ ]  TTHS, [ ]  Burning with urination, [ ]  Difficulty urinating Skin: [ ]  Rashes, [ ]  Wounds Psychological: [ ]  Anxiety, [ ]  Depression   Social History History  Substance Use Topics  . Smoking status: Former Games developer  . Smokeless tobacco: Never Used   Comment: quit- for porlonged periods he has smoked 2-3 packs a day  . Alcohol Use: Yes    Family History Family History  Problem Relation Age of Onset  . Stroke Mother     deceased age 87 had her firsrt CVA age  56  . Peripheral vascular disease Brother   . Peripheral vascular disease Father     status post leg amputation  secondary to PVD    No Known Allergies  Current Outpatient Prescriptions  Medication Sig Dispense Refill  . aspirin 81 MG tablet Take 81 mg by mouth daily.        Marland Kitchen atorvastatin (LIPITOR) 80 MG tablet Take 0.5 tablets (40 mg total) by mouth daily.  90 tablet  1  . lisinopril-hydrochlorothiazide (PRINZIDE,ZESTORETIC) 10-12.5 MG per tablet Take 1 tablet by mouth daily.  90 tablet  1  . vardenafil (LEVITRA) 20 MG tablet Take 20 mg by mouth daily as needed.  Physical Examination  Filed Vitals:   02/27/12 1019  BP: 148/77  Pulse: 56  Resp:     Body mass index is 27.65 kg/(m^2).  General:  WDWN in NAD Gait: Normal HEENT: WNL Eyes: Pupils equal Pulmonary: normal non-labored breathing , without Rales, rhonchi,  wheezing Cardiac: RRR, without  Murmurs, rubs or gallops; No carotid bruits Abdomen: soft, NT, no masses Skin: no rashes, ulcers noted Vascular Exam/Pulses: 2+ radial pulses bilaterally, carotid pulses to auscultation no bruits are heard, no abdominal mass is palpated, the patient has palpable lower extremity pulses bilaterally  Extremities without ischemic changes, no Gangrene , no cellulitis; no open wounds;  Musculoskeletal: no muscle wasting or atrophy  Neurologic: A&O X 3; Appropriate Affect ; SENSATION: normal; MOTOR FUNCTION:  moving all  extremities equally. Speech is fluent/normal  Non-Invasive Vascular Imaging: Lower arterial stent evaluation shows a patent left lower extremity femoral stent , ABIs are 1.06 on the right and triphasic to biphasic, 1.03 and triphasic to biphasic on the left. Carotid duplex shows widely patent right carotid endarterectomy with evidence of minimal hyperplasia in the bifurcation, left ICA is in the 1-39% range. AAA duplex shows a maximum diameter of 3.67 x 3.64 which is stable when compared to May of 2012 when he was 3.55 x 3.61  ASSESSMENT/PLAN: Asymptomatic patient with no complaints and no current vascular issues. The patient will followup here in one year with repeat studies of the above. The patient's questions were encouraged and answered, he is in agreement with this plan.  Lauree Chandler ANP  Clinic M.D.: [facility]

## 2012-03-13 ENCOUNTER — Other Ambulatory Visit: Payer: Self-pay | Admitting: Internal Medicine

## 2012-03-13 ENCOUNTER — Telehealth: Payer: Self-pay | Admitting: Internal Medicine

## 2012-03-13 ENCOUNTER — Other Ambulatory Visit: Payer: Self-pay | Admitting: *Deleted

## 2012-03-13 DIAGNOSIS — I10 Essential (primary) hypertension: Secondary | ICD-10-CM

## 2012-03-13 DIAGNOSIS — R7309 Other abnormal glucose: Secondary | ICD-10-CM

## 2012-03-13 DIAGNOSIS — E785 Hyperlipidemia, unspecified: Secondary | ICD-10-CM

## 2012-03-13 MED ORDER — LISINOPRIL-HYDROCHLOROTHIAZIDE 10-12.5 MG PO TABS: 1.0000 | ORAL_TABLET | Freq: Every day | ORAL | Status: DC

## 2012-03-13 NOTE — Telephone Encounter (Signed)
Pt is aware.  

## 2012-03-13 NOTE — Telephone Encounter (Signed)
lmom for pt to call back

## 2012-03-13 NOTE — Telephone Encounter (Signed)
Do you  kn ow what labs need to be done? Med sent in

## 2012-03-13 NOTE — Telephone Encounter (Signed)
I found in chart- please call pt and tell him all he has to do is go during their office hours--thanks

## 2012-03-13 NOTE — Telephone Encounter (Signed)
Instruct pt to provide rx for labs to [facility] for refill for lisinopril.  3 month supply with 3 refills

## 2012-03-13 NOTE — Telephone Encounter (Signed)
Pt was given rx from our office for pt to have  blood work at  vascular specialist office. [facility] does not do blood work. Pt still have rx for labwork. Can pt go to [facility] for lab work if so please pt order in system. Pt needs refill on lisinopril [facility] [address]

## 2012-03-26 ENCOUNTER — Other Ambulatory Visit (INDEPENDENT_AMBULATORY_CARE_PROVIDER_SITE_OTHER): Payer: Medicare Other

## 2012-03-26 DIAGNOSIS — I10 Essential (primary) hypertension: Secondary | ICD-10-CM

## 2012-03-26 DIAGNOSIS — R7309 Other abnormal glucose: Secondary | ICD-10-CM

## 2012-03-26 DIAGNOSIS — E785 Hyperlipidemia, unspecified: Secondary | ICD-10-CM

## 2012-03-26 LAB — BASIC METABOLIC PANEL
BUN: 12 mg/dL (ref 6–23)
Chloride: 104 mEq/L (ref 96–112)
Glucose, Bld: 105 mg/dL — ABNORMAL HIGH (ref 70–99)
Sodium: 139 mEq/L (ref 135–145)

## 2012-03-26 LAB — HEPATIC FUNCTION PANEL
ALT: 35 U/L (ref 0–53)
AST: 28 U/L (ref 0–37)
Albumin: 4 g/dL (ref 3.5–5.2)
Total Bilirubin: 0.6 mg/dL (ref 0.3–1.2)

## 2012-03-26 LAB — LIPID PANEL
Cholesterol: 137 mg/dL (ref 0–200)
LDL Cholesterol: 54 mg/dL (ref 0–99)

## 2012-09-09 ENCOUNTER — Telehealth: Payer: Self-pay | Admitting: Internal Medicine

## 2012-09-09 NOTE — Telephone Encounter (Signed)
Pt went to Chiropractor for shoulder issues. Pt needs xray of shoulder. Will have to pay out of pocket if done/ordered  by chiropractor. Pt requesting xray order from you. Is it ok to use a same day appt for Thursday?  Pt is in a lot of pain. The [facility] is going to fax over what XRAY is needed. Pls advise,

## 2012-09-09 NOTE — Telephone Encounter (Signed)
Ok to schedule.

## 2012-09-10 ENCOUNTER — Encounter: Payer: Self-pay | Admitting: Internal Medicine

## 2012-09-10 ENCOUNTER — Ambulatory Visit (INDEPENDENT_AMBULATORY_CARE_PROVIDER_SITE_OTHER): Payer: Medicare Other | Admitting: Internal Medicine

## 2012-09-10 VITALS — BP 146/82 | Temp 97.5°F | Wt 200.0 lb

## 2012-09-10 DIAGNOSIS — M6281 Muscle weakness (generalized): Secondary | ICD-10-CM | POA: Insufficient documentation

## 2012-09-10 DIAGNOSIS — M25512 Pain in left shoulder: Secondary | ICD-10-CM

## 2012-09-10 DIAGNOSIS — M25519 Pain in unspecified shoulder: Secondary | ICD-10-CM

## 2012-09-10 MED ORDER — HYDROCODONE-ACETAMINOPHEN 5-325 MG PO TABS: 1.0000 | ORAL_TABLET | Freq: Three times a day (TID) | ORAL | Status: DC | PRN

## 2012-09-10 MED ORDER — DICLOFENAC SODIUM 1 % TD GEL: 2.0000 g | Freq: Four times a day (QID) | TRANSDERMAL | Status: DC

## 2012-09-10 NOTE — Telephone Encounter (Signed)
appt set

## 2012-09-10 NOTE — Progress Notes (Signed)
Subjective:    Patient ID: Samuel [name], male    DOB: 12-15-1945, 67 y.o.   MRN: [id number]  HPI  66 year old white male with history of hypertension, hyperlipidemia and peripheral vascular disease complains of left shoulder pain for last 3 weeks. He does not recall specific injury or trauma. 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 He frequently has to lift boxes and heavy bags. Pain is localized to anterior aspect of left shoulder. Patient is also experiencing some pain near scapula.  He reports some improvement with application of ice. He was seen by chiropractor who thought his symptoms may be stemming from cervical radiculopathy. She ordered x-ray of neck and left shoulder.  He reports remote history of left shoulder injury when he was a teenager. He denies any severe neck pain. He denies any weakness in his left arm.  Review of Systems See HPI  Past Medical History  Diagnosis Date  . Hyperlipidemia   . Hypertension   . History of carotid stenosis     s/p right carotid endarterectomy 12/06  . Genital warts     history of  . PAD (peripheral artery disease)     Left leg  . Carotid artery occlusion   . AAA (abdominal aortic aneurysm)     History   Social History  . Marital Status: Married    Spouse Name: N/A    Number of Children: N/A  . Years of Education: N/A   Occupational History  .      self employed - [employer]   Social History Main Topics  . Smoking status: Former Games developer  . Smokeless tobacco: Never Used     Comment: quit- for porlonged periods he has smoked 2-3 packs a day  . Alcohol Use: Yes  . Drug Use: No  . Sexually Active: Not on file   Other Topics Concern  . Not on file   Social History Narrative   Married -to his second wife [name]).  Has a daughter from his first marriage who is in her early 34s.     He drinks approximately 4 alcoholic beverages, beer, a day.  He has done so since age 75.  Denies any binge drinking or heavy alcohol  use.  Quit tobacco 2 year ago.  He has smoked on and off x20-30 years.  For prolonged periods he has smoked 2-3 packs a day.     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  She lives in [place], [place]    Past Surgical History  Procedure Laterality Date  . Knee surgery  1962  . Appendectomy  1960  . Carotid endarterectomy  05/2005    right  . Left leg stents      Family History  Problem Relation Age of Onset  . Stroke Mother     deceased age 45 had her firsrt CVA age  56  . Peripheral vascular disease Brother   . Peripheral vascular disease Father     status post leg amputation  secondary to PVD    No Known Allergies  Current Outpatient Prescriptions on File Prior to Visit  Medication Sig Dispense Refill  . aspirin 81 MG tablet Take 81 mg by mouth daily.        Marland Kitchen atorvastatin (LIPITOR) 80 MG tablet Take 0.5 tablets (40 mg total) by mouth daily.  90 tablet  1  . lisinopril-hydrochlorothiazide (PRINZIDE,ZESTORETIC) 10-12.5 MG per tablet Take 1  tablet by mouth daily.  90 tablet  2  . vardenafil (LEVITRA) 20 MG tablet Take 20 mg by mouth daily as needed.         No current facility-administered medications on file prior to visit.    BP 146/82  Temp(Src) 97.5 F (36.4 C) (Oral)  Wt 200 lb (90.719 kg)  BMI 28.7 kg/m2       Objective:   Physical Exam  Constitutional: He is oriented to person, place, and time. He appears well-developed and well-nourished.  HENT:  Head: Normocephalic and atraumatic.  Cardiovascular: Normal rate, regular rhythm and normal heart sounds.   Pulmonary/Chest: Effort normal and breath sounds normal. He has no rales.  Musculoskeletal:  Tenderness of left a.c. Joint, discomfort with abduction of left arm  Neurological: He is alert and oriented to person, place, and time.  Skin: Skin is warm and dry.          Assessment & Plan:

## 2012-09-10 NOTE — Patient Instructions (Addendum)
Avoid heavy lifting Our office will contact you re: orthopedic referral

## 2012-09-10 NOTE — Assessment & Plan Note (Signed)
67 year old male with left shoulder pain for 3 weeks. On exam he has difficulty raising left arm above shoulder level. He also has tenderness at [facility] joint. I suspect he has rotator cuff tendinitis/injury. Refer to [facility] orthopedics for further evaluation and treatment. Use Voltaren gel 4 times a day for now. Use Vicodin at bedtime for more severe symptoms.

## 2012-09-11 ENCOUNTER — Telehealth: Payer: Self-pay | Admitting: Internal Medicine

## 2012-09-11 NOTE — Telephone Encounter (Signed)
Caller: Samuel [name]/Patient; Phone: [phone]; Reason for Call: Pt is awaiting to hear from office concerning referral to [facility].   RN found referral in [facility] for [facility], and called [phone].  The office states they have the referral and they tried to call the patient on 09/10/12 for appt but could not leave VM.  Office states they will try to contact patient again today (09/11/12)

## 2012-12-10 ENCOUNTER — Ambulatory Visit (INDEPENDENT_AMBULATORY_CARE_PROVIDER_SITE_OTHER): Payer: Medicare Other | Admitting: Family Medicine

## 2012-12-10 ENCOUNTER — Encounter: Payer: Self-pay | Admitting: Family Medicine

## 2012-12-10 VITALS — BP 130/76 | Temp 98.2°F | Wt 190.0 lb

## 2012-12-10 DIAGNOSIS — M25512 Pain in left shoulder: Secondary | ICD-10-CM

## 2012-12-10 DIAGNOSIS — M25519 Pain in unspecified shoulder: Secondary | ICD-10-CM

## 2012-12-10 NOTE — Patient Instructions (Signed)
-  ibuprofen 400-600mg  up to twice daily or 500-1000mg  of tylenol up to 3 times daily  -topical menthol or capsacin topical creams too  -follow up with [place] [facility]

## 2012-12-10 NOTE — Progress Notes (Signed)
Chief Complaint  Patient presents with  . Shoulder Pain    HPI:  67 yo male pt of Dr. Artist Pais here for cute visit for shoulder pain: -seen by PCP 09/2012 for L shoulder pain thought to be RTC or AC jt and referred to [facility] per notes -reports saw [facility] - got plain films and shot of cortisone which helped for 3 days - told thought arthritis -very active at work and with increased activity pain is worse - took prednisone he had on had from last gout flare - pain got better -pain has come back a little since then -denies fevers, chills, malaise, numbness, weakness  ROS: See pertinent positives and negatives per HPI.  Past Medical History  Diagnosis Date  . Hyperlipidemia   . Hypertension   . History of carotid stenosis     s/p right carotid endarterectomy 12/06  . Genital warts     history of  . PAD (peripheral artery disease)     Left leg  . Carotid artery occlusion   . AAA (abdominal aortic aneurysm)     Family History  Problem Relation Age of Onset  . Stroke Mother     deceased age 65 had her firsrt CVA age  74  . Peripheral vascular disease Brother   . Peripheral vascular disease Father     status post leg amputation  secondary to PVD    History   Social History  . Marital Status: Married    Spouse Name: N/A    Number of Children: N/A  . Years of Education: N/A   Occupational History  .      self employed - [employer]   Social History Main Topics  . Smoking status: Former Games developer  . Smokeless tobacco: Never Used     Comment: quit- for porlonged periods he has smoked 2-3 packs a day  . Alcohol Use: Yes  . Drug Use: No  . Sexually Active: None   Other Topics Concern  . None   Social History Narrative   Married -to his second wife [name]).  Has a daughter from his first marriage who is in her early 7s.     He drinks approximately 4 alcoholic beverages, beer, a day.  He has done so since age 57.  Denies any binge drinking or heavy  alcohol use.  Quit tobacco 2 year ago.  He has smoked on and off x20-30 years.  For prolonged periods he has smoked 2-3 packs a day.     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  She lives in [place], [place]    Current outpatient prescriptions:aspirin 81 MG tablet, Take 81 mg by mouth daily.  , Disp: , Rfl: ;  atorvastatin (LIPITOR) 80 MG tablet, Take 0.5 tablets (40 mg total) by mouth daily., Disp: 90 tablet, Rfl: 1;  diclofenac sodium (VOLTAREN) 1 % GEL, Apply 2 g topically 4 (four) times daily., Disp: 2 Tube, Rfl: 0 HYDROcodone-acetaminophen (NORCO/VICODIN) 5-325 MG per tablet, Take 1 tablet by mouth every 8 (eight) hours as needed for pain., Disp: 30 tablet, Rfl: 1;  lisinopril-hydrochlorothiazide (PRINZIDE,ZESTORETIC) 10-12.5 MG per tablet, Take 1 tablet by mouth daily., Disp: 90 tablet, Rfl: 2;  vardenafil (LEVITRA) 20 MG tablet, Take 20 mg by mouth daily as needed.  , Disp: , Rfl:   EXAM:  Filed Vitals:   12/10/12 0920  BP: 130/76  Temp: 98.2 F (36.8 C)  Body mass index is 27.26 kg/(m^2).  GENERAL: vitals reviewed and listed above, alert, oriented, appears well hydrated and in no acute distress  HEENT: atraumatic, conjunttiva clear, no obvious abnormalities on inspection of external nose and ears  NECK: no obvious masses on inspection  LUNGS: clear to auscultation bilaterally, no wheezes, rales or rhonchi, good air movement  CV: HRRR, no peripheral edema  MS: moves all extremities without noticeable abnormality -normal ROM UE bilat, normal muscles strength and sensation, TTP L AC jt, neg empty can, neg neers, neg impingement test  PSYCH: pleasant and cooperative, no obvious depression or anxiety  ASSESSMENT AND PLAN:  Discussed the following assessment and plan:  Left shoulder pain  -likley AC jt OA -Recommendations per orders an instructions, risks and use of medications and return precautions discussed. -Patient  advised to return or notify a doctor immediately if symptoms worsen or persist or new concerns arise.  Patient Instructions  -ibuprofen 400-600mg  up to twice daily or 500-1000mg  of tylenol up to 3 times daily  -topical menthol or capsacin topical creams too  -follow up with [facility]     Margot Oriordan, Dahlia Client R.

## 2013-02-18 ENCOUNTER — Encounter: Payer: Self-pay | Admitting: Internal Medicine

## 2013-02-18 ENCOUNTER — Ambulatory Visit (INDEPENDENT_AMBULATORY_CARE_PROVIDER_SITE_OTHER): Payer: Medicare Other | Admitting: Internal Medicine

## 2013-02-18 VITALS — BP 134/72 | Temp 98.1°F | Wt 188.0 lb

## 2013-02-18 DIAGNOSIS — E785 Hyperlipidemia, unspecified: Secondary | ICD-10-CM

## 2013-02-18 DIAGNOSIS — IMO0001 Reserved for inherently not codable concepts without codable children: Secondary | ICD-10-CM

## 2013-02-18 DIAGNOSIS — R5381 Other malaise: Secondary | ICD-10-CM

## 2013-02-18 DIAGNOSIS — M25519 Pain in unspecified shoulder: Secondary | ICD-10-CM

## 2013-02-18 DIAGNOSIS — M25511 Pain in right shoulder: Secondary | ICD-10-CM

## 2013-02-18 DIAGNOSIS — R531 Weakness: Secondary | ICD-10-CM

## 2013-02-18 DIAGNOSIS — I10 Essential (primary) hypertension: Secondary | ICD-10-CM

## 2013-02-18 MED ORDER — HYDROCODONE-ACETAMINOPHEN 5-325 MG PO TABS: 1.0000 | ORAL_TABLET | Freq: Three times a day (TID) | ORAL | Status: DC | PRN

## 2013-02-18 MED ORDER — LISINOPRIL-HYDROCHLOROTHIAZIDE 10-12.5 MG PO TABS: 1.0000 | ORAL_TABLET | Freq: Every day | ORAL | Status: DC

## 2013-02-18 NOTE — Assessment & Plan Note (Signed)
Discontinue over-the-counter NSAID use. Continue lisinopril hydrochlorothiazide. Monitor electrolytes and kidney function. BP: 134/72 mmHg

## 2013-02-18 NOTE — Assessment & Plan Note (Addendum)
Patient experiencing intermittent bilateral shoulder and hip pain. Symptoms worrisome for polymyalgia rheumatica. Patient has also been on statin medication for years. Check CPK, sedimentation rate, and thyroid studies.  Hold Lipitor for now. Use Vicodin for pain control.  Addendum 02/19/13 - Patient's sedimentation rate 25. CPK slightly elevated. I suspect patient's intermittent proximal muscle weakness related to statin use. Patient advised to continue to hold Lipitor for the next 1 to 2 weeks. Treat with prednisone 20 mg once daily.  Reassess in 7 to 10 days.

## 2013-02-18 NOTE — Progress Notes (Signed)
Subjective:    Patient ID: Samuel [name], male    DOB: Nov 01, 1945, 67 y.o.   MRN: [id number]  HPI  67 year old white male with history of hypertension, hyperlipidemia and borderline type 2 diabetes complains of intermittent bilateral shoulder pain and hip pain. Patient was seen in April of 2014 for left shoulder pain. He was seen by orthopedic specialist and given cortisone injection. He reports symptoms improved for about 3 days. He later developed bilateral symptoms of both shoulders and hips. His symptoms are intermittent. He describes days when he is completely fine and other days when he has trouble picking up a gallon of milk.   Patient has been using over-the-counter ibuprofen 400- 600 mg twice daily.  Review of Systems Negative for muscle tenderness.  Negative for headaches, no jaw claudication  Past Medical History  Diagnosis Date  . Hyperlipidemia   . Hypertension   . History of carotid stenosis     s/p right carotid endarterectomy 12/06  . Genital warts     history of  . PAD (peripheral artery disease)     Left leg  . Carotid artery occlusion   . AAA (abdominal aortic aneurysm)     History   Social History  . Marital Status: Married    Spouse Name: N/A    Number of Children: N/A  . Years of Education: N/A   Occupational History  .      self employed - [employer]   Social History Main Topics  . Smoking status: Former Games developer  . Smokeless tobacco: Never Used     Comment: quit- for porlonged periods he has smoked 2-3 packs a day  . Alcohol Use: Yes  . Drug Use: No  . Sexual Activity: Not on file   Other Topics Concern  . Not on file   Social History Narrative   Married -to his second wife Samuel [name]).  Has a daughter from his first marriage who is in her early 80s.     He drinks approximately 4 alcoholic beverages, beer, a day.  He has done so since age 67.  Denies any binge drinking or heavy alcohol use.  Quit tobacco 2 year ago.  He has smoked on and off  x20-30 years.  For prolonged periods he has smoked 2-3 packs a day.     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  She lives in [place], [place]    Past Surgical History  Procedure Laterality Date  . Knee surgery  1962  . Appendectomy  1960  . Carotid endarterectomy  05/2005    right  . Left leg stents      Family History  Problem Relation Age of Onset  . Stroke Mother     deceased age 35 had her firsrt CVA age  30  . Peripheral vascular disease Brother   . Peripheral vascular disease Father     status post leg amputation  secondary to PVD    No Known Allergies  Current Outpatient Prescriptions on File Prior to Visit  Medication Sig Dispense Refill  . vardenafil (LEVITRA) 20 MG tablet Take 20 mg by mouth daily as needed.         No current facility-administered medications on file prior to visit.    BP 134/72  Temp(Src) 98.1 F (36.7 C)  Wt 188 lb (85.276 kg)  BMI 26.98 kg/m2      Objective:   Physical Exam  Constitutional:  He is oriented to person, place, and time. He appears well-developed and well-nourished.  Cardiovascular: Normal rate, regular rhythm and normal heart sounds.   No murmur heard. Pulmonary/Chest: Effort normal and breath sounds normal. He has no wheezes.  Musculoskeletal: He exhibits no edema.  Mild upper ext weakness Only able to perform 5-6 squats No shoulder or hip tenderness. No pain with internal or external rotation of hips  Neurological: He is alert and oriented to person, place, and time. No cranial nerve deficit.  Skin: Skin is warm and dry.  Psychiatric: He has a normal mood and affect. His behavior is normal.          Assessment & Plan:

## 2013-02-19 LAB — CBC WITH DIFFERENTIAL/PLATELET
Basophils Absolute: 0.1 10*3/uL (ref 0.0–0.1)
Basophils Relative: 0.7 % (ref 0.0–3.0)
Eosinophils Absolute: 0.2 10*3/uL (ref 0.0–0.7)
HCT: 38 % — ABNORMAL LOW (ref 39.0–52.0)
Hemoglobin: 13.1 g/dL (ref 13.0–17.0)
Lymphs Abs: 2.6 10*3/uL (ref 0.7–4.0)
MCHC: 34.5 g/dL (ref 30.0–36.0)
Monocytes Relative: 10.3 % (ref 3.0–12.0)
Neutro Abs: 4.3 10*3/uL (ref 1.4–7.7)
RBC: 4.12 Mil/uL — ABNORMAL LOW (ref 4.22–5.81)
RDW: 13.4 % (ref 11.5–14.6)

## 2013-02-19 LAB — LDL CHOLESTEROL, DIRECT: Direct LDL: 64.2 mg/dL

## 2013-02-19 LAB — HEPATIC FUNCTION PANEL
AST: 26 U/L (ref 0–37)
Albumin: 4.3 g/dL (ref 3.5–5.2)
Total Protein: 7.4 g/dL (ref 6.0–8.3)

## 2013-02-19 LAB — BASIC METABOLIC PANEL
CO2: 27 mEq/L (ref 19–32)
Glucose, Bld: 87 mg/dL (ref 70–99)
Potassium: 4.1 mEq/L (ref 3.5–5.1)
Sodium: 141 mEq/L (ref 135–145)

## 2013-02-19 LAB — SEDIMENTATION RATE: Sed Rate: 25 mm/hr — ABNORMAL HIGH (ref 0–22)

## 2013-02-19 LAB — LIPID PANEL
Cholesterol: 139 mg/dL (ref 0–200)
VLDL: 87.6 mg/dL — ABNORMAL HIGH (ref 0.0–40.0)

## 2013-02-19 LAB — T4, FREE: Free T4: 0.75 ng/dL (ref 0.60–1.60)

## 2013-02-19 MED ORDER — PREDNISONE 20 MG PO TABS: 20.0000 mg | ORAL_TABLET | Freq: Every day | ORAL | Status: DC

## 2013-02-19 NOTE — Addendum Note (Signed)
Addended by: Meda Coffee on: 02/19/2013 04:57 PM   Modules accepted: Orders

## 2013-02-26 ENCOUNTER — Other Ambulatory Visit: Payer: Self-pay | Admitting: Internal Medicine

## 2013-02-26 ENCOUNTER — Encounter: Payer: Self-pay | Admitting: Internal Medicine

## 2013-02-26 ENCOUNTER — Ambulatory Visit (INDEPENDENT_AMBULATORY_CARE_PROVIDER_SITE_OTHER): Payer: Medicare Other | Admitting: Internal Medicine

## 2013-02-26 VITALS — BP 150/80 | Temp 97.3°F | Wt 180.0 lb

## 2013-02-26 DIAGNOSIS — I10 Essential (primary) hypertension: Secondary | ICD-10-CM

## 2013-02-26 DIAGNOSIS — M6281 Muscle weakness (generalized): Secondary | ICD-10-CM

## 2013-02-26 DIAGNOSIS — IMO0001 Reserved for inherently not codable concepts without codable children: Secondary | ICD-10-CM

## 2013-02-26 MED ORDER — PITAVASTATIN CALCIUM 2 MG PO TABS: 2.0000 mg | ORAL_TABLET | Freq: Every day | ORAL | Status: DC

## 2013-02-26 MED ORDER — PREDNISONE 20 MG PO TABS: 20.0000 mg | ORAL_TABLET | Freq: Every day | ORAL | Status: DC

## 2013-02-26 NOTE — Assessment & Plan Note (Addendum)
Patient advised to restart BP medication.  If SBP < 110, he can decrease medication dose in half.  Lisinopril hydrochlorothiazide 10/12.5 mg once daily. BP: 150/80 mmHg   Blood pressure confirmed with manual cuff in left and right arm.

## 2013-02-26 NOTE — Patient Instructions (Addendum)
Please complete the following lab tests before your next follow up appointment: FLP, LFTs, CPK - 272.4 Please contact our office if your muscle weakness returns on Livalo.

## 2013-02-26 NOTE — Assessment & Plan Note (Signed)
Patient reports proximal muscle pain and weakness have improved since discontinuing statin medication (Lipitor). Sedimentation rate was 25 and CPK was minimally elevated. His thyroid studies were normal. Take additional 10 days of prednisone 20 mg. Once patient's weakness completely resolved, trial of Livalo 2 mg once daily.

## 2013-02-26 NOTE — Progress Notes (Signed)
Subjective:    Patient ID: Samuel [name], male    DOB: 01/12/1946, 67 y.o.   MRN: [id number]  HPI  67 year old white male with history of hypertension, hyperlipidemia and borderline type 2 diabetes for followup regarding muscle weakness. His weakness was predominantly in proximal muscles. His CPK level was minimally elevated. His sedimentation rate was 25. He discontinued Lipitor has been taking prednisone 20 mg once daily. Patient reports muscle weakness has significantly improved.  Unfortunately he has also discontinued his antihypertensives.  Review of Systems Negative for chest pain or shortness of breath  Past Medical History  Diagnosis Date  . Hyperlipidemia   . Hypertension   . History of carotid stenosis     s/p right carotid endarterectomy 12/06  . Genital warts     history of  . PAD (peripheral artery disease)     Left leg  . Carotid artery occlusion   . AAA (abdominal aortic aneurysm)     History   Social History  . Marital Status: Married    Spouse Name: N/A    Number of Children: N/A  . Years of Education: N/A   Occupational History  .      self employed - [employer]   Social History Main Topics  . Smoking status: Former Games developer  . Smokeless tobacco: Never Used     Comment: quit- for porlonged periods he has smoked 2-3 packs a day  . Alcohol Use: Yes  . Drug Use: No  . Sexual Activity: Not on file   Other Topics Concern  . Not on file   Social History Narrative   Married -to his second wife Samuel [name]).  Has a daughter from his first marriage who is in her early 50s.     He drinks approximately 4 alcoholic beverages, beer, a day.  He has done so since age 32.  Denies any binge drinking or heavy alcohol use.  Quit tobacco 2 year ago.  He has smoked on and off x20-30 years.  For prolonged periods he has smoked 2-3 packs a day.     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  She lives in [place], [place]     Past Surgical History  Procedure Laterality Date  . Knee surgery  1962  . Appendectomy  1960  . Carotid endarterectomy  05/2005    right  . Left leg stents      Family History  Problem Relation Age of Onset  . Stroke Mother     deceased age 16 had her firsrt CVA age  5  . Peripheral vascular disease Brother   . Peripheral vascular disease Father     status post leg amputation  secondary to PVD    No Known Allergies  Current Outpatient Prescriptions on File Prior to Visit  Medication Sig Dispense Refill  . HYDROcodone-acetaminophen (NORCO/VICODIN) 5-325 MG per tablet Take 1 tablet by mouth every 8 (eight) hours as needed for pain.  30 tablet  1  . lisinopril-hydrochlorothiazide (PRINZIDE,ZESTORETIC) 10-12.5 MG per tablet Take 1 tablet by mouth daily.  90 tablet  3  . vardenafil (LEVITRA) 20 MG tablet Take 20 mg by mouth daily as needed.         No current facility-administered medications on file prior to visit.    BP 150/80  Temp(Src) 97.3 F (36.3 C) (Oral)  Wt 180 lb (81.647 kg)  BMI 25.83 kg/m2  Objective:   Physical Exam  Constitutional: He is oriented to person, place, and time. He appears well-developed and well-nourished.  Cardiovascular: Normal rate, regular rhythm and normal heart sounds.   Pulmonary/Chest: Effort normal and breath sounds normal. He has no wheezes.  Musculoskeletal:  No muscle weakness or tenderness  Neurological: He is alert and oriented to person, place, and time. No cranial nerve deficit.          Assessment & Plan:

## 2013-03-01 ENCOUNTER — Encounter: Payer: Self-pay | Admitting: Family

## 2013-03-02 ENCOUNTER — Other Ambulatory Visit: Payer: Self-pay | Admitting: Internal Medicine

## 2013-03-02 ENCOUNTER — Ambulatory Visit (INDEPENDENT_AMBULATORY_CARE_PROVIDER_SITE_OTHER): Payer: Medicare Other | Admitting: Family

## 2013-03-02 ENCOUNTER — Encounter: Payer: Self-pay | Admitting: Family

## 2013-03-02 ENCOUNTER — Encounter (INDEPENDENT_AMBULATORY_CARE_PROVIDER_SITE_OTHER): Payer: Medicare Other | Admitting: Vascular Surgery

## 2013-03-02 ENCOUNTER — Other Ambulatory Visit (INDEPENDENT_AMBULATORY_CARE_PROVIDER_SITE_OTHER): Payer: Medicare Other | Admitting: Vascular Surgery

## 2013-03-02 ENCOUNTER — Other Ambulatory Visit: Payer: Self-pay | Admitting: *Deleted

## 2013-03-02 VITALS — BP 163/87 | HR 56 | Resp 16 | Ht 70.0 in | Wt 187.0 lb

## 2013-03-02 DIAGNOSIS — I714 Abdominal aortic aneurysm, without rupture, unspecified: Secondary | ICD-10-CM

## 2013-03-02 DIAGNOSIS — Z48812 Encounter for surgical aftercare following surgery on the circulatory system: Secondary | ICD-10-CM

## 2013-03-02 DIAGNOSIS — I6529 Occlusion and stenosis of unspecified carotid artery: Secondary | ICD-10-CM

## 2013-03-02 DIAGNOSIS — I739 Peripheral vascular disease, unspecified: Secondary | ICD-10-CM

## 2013-03-02 DIAGNOSIS — E785 Hyperlipidemia, unspecified: Secondary | ICD-10-CM

## 2013-03-02 NOTE — Progress Notes (Signed)
[facility] HISTORY AND PHYSICAL   MRN : [id number]  History of Present Illness:   Samuel [name] is a 67 y.o. male patient of Dr. Hart Rochester who is status post left superficial femoral artery stent was placed by Dr. Myra Gianotti in April 2012, the patient's also under regular surveillance for a small known AAA as well as carotid and is status post a right CEA in December of 2006 by Dr. Hart Rochester. In the last 7 months he has noted bilateral shoulder and hip pain, this has been determined to be from Lipitor use and pain has since resolved since Lipitor stopped.    Pt. denies claudication in legs.  Pt. denies rest pain; denies night pain denies non healing ulcers on lower extremities. Patient has Negative history of TIA or stroke symptom.  The patient denies amaurosis fugax or monocular blindness.  The patient  denies facial drooping.  Pt. denies hemiplegia.  The patient denies receptive or expressive aphasia.  Pt. denies weakness.  The patient has not had back or abdominal pain.  Pt Diabetic: No Pt smoker: smoker , quit 10 years ago  Current Outpatient Prescriptions  Medication Sig Dispense Refill  . Multiple Vitamin (MULTIVITAMIN) tablet Take 1 tablet by mouth daily.      . predniSONE (DELTASONE) 20 MG tablet Take 1 tablet (20 mg total) by mouth daily.  10 tablet  0  . HYDROcodone-acetaminophen (NORCO/VICODIN) 5-325 MG per tablet Take 1 tablet by mouth every 8 (eight) hours as needed for pain.  30 tablet  1  . lisinopril-hydrochlorothiazide (PRINZIDE,ZESTORETIC) 10-12.5 MG per tablet Take 1 tablet by mouth daily.  90 tablet  3  . Pitavastatin Calcium (LIVALO) 2 MG TABS Take 1 tablet (2 mg total) by mouth daily.  30 tablet  5  . vardenafil (LEVITRA) 20 MG tablet Take 20 mg by mouth daily as needed.         No current facility-administered medications for this visit.    Pt meds include: Statin :Yes Betablocker: No ASA: Yes Other anticoagulants/antiplatelets:  none  Past Medical History  Diagnosis Date  . Hyperlipidemia   . Hypertension   . History of carotid stenosis     s/p right carotid endarterectomy 12/06  . Genital warts     history of  . PAD (peripheral artery disease)     Left leg  . Carotid artery occlusion   . AAA (abdominal aortic aneurysm)     Past Surgical History  Procedure Laterality Date  . Knee surgery  1962  . Appendectomy  1960  . Carotid endarterectomy  05/2005    right  . Left leg stents      Social History History  Substance Use Topics  . Smoking status: Former Games developer  . Smokeless tobacco: Never Used     Comment: quit- for porlonged periods he has smoked 2-3 packs a day  . Alcohol Use: Yes    Family History Family History  Problem Relation Age of Onset  . Stroke Mother     deceased age 40 had her firsrt CVA age  41  . Peripheral vascular disease Brother   . Peripheral vascular disease Father     status post leg amputation  secondary to PVD    No Known Allergies   REVIEW OF SYSTEMS  General: [ ]  Weight loss, [ ]  Fever, [ ]  chills Neurologic: [ ]  Dizziness, [ ]  Blackouts, [ ]  Seizure [ ]  Stroke, [ ]  "Mini stroke", [ ]  Slurred speech, [ ]   Temporary blindness; [ ]  weakness in arms or legs, [ ]  Hoarseness [ ]  Dysphagia Cardiac: [ ]  Chest pain/pressure, [ ]  Shortness of breath at rest [ ]  Shortness of breath with exertion, [ ]  Atrial fibrillation or irregular heartbeat  Vascular: [ ]  Pain in legs with walking, [ ]  Pain in legs at rest, [ ]  Pain in legs at night,  [ ]  Non-healing ulcer, [ ]  Blood clot in vein/DVT,   Pulmonary: [ ]  Home oxygen, [ ]  Productive cough, [ ]  Coughing up blood, [ ]  Asthma,  [ ]  Wheezing [ ]  COPD Musculoskeletal:  [ ]  Arthritis, [ ]  Low back pain, [ ]  Joint pain Hematologic: [ ]  Easy Bruising, [ ]  Anemia; [ ]  Hepatitis Gastrointestinal: [ ]  Blood in stool, [ ]  Gastroesophageal Reflux/heartburn, Urinary: [ ]  chronic Kidney disease, [ ]  on HD - [ ]  MWF or [ ]  TTHS, [ ]   Burning with urination, [ ]  Difficulty urinating Skin: [ ]  Rashes, [ ]  Wounds Psychological: [ ]  Anxiety, [ ]  Depression  Physical Examination Filed Vitals:   03/02/13 1056  BP: 163/87  Pulse: 56  Resp:    Filed Weights   03/02/13 1052  Weight: 187 lb (84.823 kg)   Body mass index is 26.83 kg/(m^2).  General:  WDWN in NAD Gait: Normal HENT: WNL Eyes: Pupils equal Pulmonary: normal non-labored breathing , without Rales, rhonchi,  wheezing Cardiac: RRR, without  Murmurs, rubs or gallops; No carotid bruits Abdomen: soft, NT, no masses Skin: no rashes, ulcers noted;  no Gangrene , no cellulitis; no open wounds;   Vascular Exam/Pulses: VASCULAR EXAM  Carotid Bruits Left Right   Negative Negative   Aorta is not palpable Bilateral radial pulses are palpable at 2+                          VASCULAR EXAM: Extremities without ischemic changes  without Gangrene ; without open wounds.                                                                                                          LE Pulses LEFT RIGHT       FEMORAL   palpable   palpable        POPLITEAL  not palpable   not palpable       POSTERIOR TIBIAL   palpable    palpable        DORSALIS PEDIS      ANTERIOR TIBIAL  palpable   palpable      Musculoskeletal: no muscle wasting or atrophy; no edema  Neurologic: A&O X 3; Appropriate Affect ;  SENSATION: normal; MOTOR FUNCTION: 5/5 Symmetric, CN 2-12 intact Speech is fluent/normal   Non-Invasive Vascular Imaging:   Aorto-iliac Duplex (03/02/2013): 4.00 x 4.05 cm Previous maximum measurements (02/27/2012): 3.67 x 3.64 cm  Carotid Duplex (03/02/2013): Bilateral internal carotid arteries demonstrate <40% stenosis; this is unchanged since previous study on 02/27/12.  Left Lower Extremity arterial Duplex (03/02/2013): Elevated velocities at the left superficial femoral artery  proximal stent segment suggestive of greater than 50% stenosis. Remainder of stent is  patent with homogenous plaque noted throughout.   ABI's: Right: 1.06 with triphasic waveforms Left: 1.08 with triphasic waveforms, stable compared to 02/27/12.  ASSESSMENT:  [name] is a 67 y.o. male patient of Dr. Hart Rochester who is status post left superficial femoral artery stent was placed by Dr. Myra Gianotti in April 2012, the patient's also under regular surveillance for a small known AAA as well as carotid and is status post a right CEA in December of 2006. He is asymptomatic of cerebrovascular and PAD symptoms. His bilateral internal carotid artery stenosis remains minimal. His lower extremity arterial circulation is normal based on ABI's. Will continue to monitor elevated velocities at the left superficial femoral artery proximal stent segment suggestive of greater than 50% stenosis.  PLAN:  Based on today's exam and non-invasive vascular lab results, and after discussing with Dr. Hart Rochester, patient was advised to return in 1 year for bilateral carotid Duplex, aorto-iliac Duplex, bilateral LE Duplex and ABI's. Resume 81 mg daily ASA.   I discussed in depth with the patient the nature of atherosclerosis, and emphasized the importance of maximal medical management including strict control of blood pressure, blood glucose, and lipid levels, obtaining regular exercise, and continued cessation of smoking.  The patient is aware that without maximal medical management the underlying atherosclerotic disease process will progress, limiting the benefit of any interventions. The patient was given information about stroke prevention and what symptoms should prompt the patient to seek immediate medical care. The patient was given information about AAA including signs, symptoms, treatment,  what symptoms should prompt the patient to seek immediate medical care, and how to minimize the risk of enlargement and rupture of aneurysms. The patient was given information about PAD including signs, symptoms,  treatment, what symptoms should prompt the patient to seek immediate medical care, and risk reduction measures to take.  Charisse March, RN, MSN, FNP-C [facility] Office: [phone]  Clinic MD: Hart Rochester 03/02/2013 10:56 AM

## 2013-03-02 NOTE — Patient Instructions (Addendum)
Stroke Prevention Some medical conditions and behaviors are associated with an increased chance of having a stroke. You may prevent a stroke by making healthy choices and managing medical conditions. Reduce your risk of having a stroke by:  Staying physically active. Get at least 30 minutes of activity on most or all days.  Not smoking. It may also be helpful to avoid exposure to secondhand smoke.  Limiting alcohol use. Moderate alcohol use is considered to be:  No more than 2 drinks per day for men.  No more than 1 drink per day for nonpregnant women.  Eating healthy foods.  Include 5 or more servings of fruits and vegetables a day.  Certain diets may be prescribed to address high blood pressure, high cholesterol, diabetes, or obesity.  Managing your cholesterol levels.  A low-saturated fat, low-trans fat, low-cholesterol, and high-fiber diet may control cholesterol levels.  Take any prescribed medicines to control cholesterol as directed by your caregiver.  Managing your diabetes.  A controlled-carbohydrate, controlled-sugar diet is recommended to manage diabetes.  Take any prescribed medicines to control diabetes as directed by your caregiver.  Controlling your high blood pressure (hypertension).  A low-salt (sodium), low-saturated fat, low-trans fat, and low-cholesterol diet is recommended to manage high blood pressure.  Take any prescribed medicines to control hypertension as directed by your caregiver.  Maintaining a healthy weight.  A reduced-calorie, low-sodium, low-saturated fat, low-trans fat, low-cholesterol diet is recommended to manage weight.  Stopping drug abuse.  Avoiding birth control pills.  Talk to your caregiver about the risks of taking birth control pills if you are over 71 years old, smoke, get migraines, or have ever had a blood clot.  Getting evaluated for sleep disorders (sleep apnea).  Talk to your caregiver about getting a sleep evaluation  if you snore a lot or have excessive sleepiness.  Taking medicines as directed by your caregiver.  For some people, aspirin or blood thinners (anticoagulants) are helpful in reducing the risk of forming abnormal blood clots that can lead to stroke. If you have the irregular heart rhythm of atrial fibrillation, you should be on a blood thinner unless there is a good reason you cannot take them.  Understand all your medicine instructions. SEEK IMMEDIATE MEDICAL CARE IF:   You have sudden weakness or numbness of the face, arm, or leg, especially on one side of the body.  You have sudden confusion.  You have trouble speaking (aphasia) or understanding.  You have sudden trouble seeing in one or both eyes.  You have sudden trouble walking.  You have dizziness.  You have a loss of balance or coordination.  You have a sudden, severe headache with no known cause.  You have new chest pain or an irregular heartbeat. Any of these symptoms may represent a serious problem that is an emergency. Do not wait to see if the symptoms will go away. Get medical help right away. Call your local emergency services (911 in [place] Do not drive yourself to the hospital. Document Released: 07/04/2004 Document Revised: 08/19/2011 Document Reviewed: 01/14/2011 [facility] Patient Information 2014 [place], [place].   Abdominal Aortic Aneurysm  An aneurysm is the enlargement (dilatation), bulging, or ballooning out of part of the wall of a vein or artery. An aortic aneurysm is a bulging in the largest artery of the body. This artery supplies blood from the heart to the rest of the body.  The first part of the aorta is called the thoracic aorta. It leaves the heart, rises (  ascends), arches, and goes down (descends) through the chest until it reaches the diaphragm. The diaphragm is the muscular part between the chest and abdomen.  The second part of the aorta is called the abdominal aorta after it has passed the  diaphragm and continues down through the abdomen. The abdominal aorta ends where it splits to form the two iliac arteries that go to the legs. Aortic aneurysms can develop anywhere along the length of the aorta. The majority are located along the abdominal aorta. The major concern with an aortic aneurysm is that it can enlarge and rupture. This can cause death unless diagnosed and treated promptly. Aneurysms can also develop blood clots or infections. CAUSES  Many aortic aneurysms are caused by arteriosclerosis. Arteriosclerosis can weaken the aortic wall. The pressure of the blood being pumped through the aorta causes it to balloon out at the site of weakness. Therefore, high blood pressure (hypertension) is associated with aneurysm. Other risk factors include:  Age over 62.  Tobacco use.  Being male.  White race.  Family history of aneurysm.  Less frequent causes of abdominal aortic aneurysms include:  Connective tissue diseases.  Abdominal trauma.  Inflammation of blood vessles (arteritis).  Inherited (congenital) malformations.  Infection. SYMPTOMS  The signs and symptoms of an unruptured aneurysm will partly depend on its size and rate of growth.   Abdominal aortic aneurysms may cause pain. The pain typically has a deep quality as if it is piercing into the person. It is felt most often in the lower back area. The pain is usually steady but may be relieved by changing your body position.  The person may also become aware of an abnormally prominent pulse in the belly (abdominal pulsation). DIAGNOSIS  An aortic aneurysm may be discovered by chance on physical exam, or on X-ray studies done for other reasons. It may be suspected because of other problems such as back or abdominal pain. The following tests may help identify the problem.  X-rays of the abdomen can show calcium deposits in the aneurysm wall.  CT scanning of the abdomen, particularly with contrast medium, is  accurate at showing the exact size and shape of the aneurysm.  Ultrasounds give a clear picture of the size of an aneurysm (about 98% accuracy).  MRI scanning is accurate, but often unnecessary.  An abdominal angiogram shows the source of the major blood vessels arising from the aorta. It reveals the size and extent of any aneurysm. It can also show a clot clinging to the wall of the aneurysm (mural thrombus). TREATMENT  Treating an abdominal aortic aneurysm depends on the size. A rupture of an aneurysm is uncommon when they are less than 5 cm wide (2 inches). Rupture is far more common in aneurysms that are over 6 cm wide (2.4 inches).  Surgical repair is usually recommended for all aneurysms over 6 cm wide (2.4 inches). This depends on the health, age, and other circumstances of the individual. This type of surgery consists of opening the abdomen, removing the aneurysm, and sewing a synthetic graft (similar to a cloth tube) in its place. A less invasive form of this surgery, using stent grafts, is sometimes recommended.  For most patients, elective repair is recommended for aneurysms between 4 and 6 cm (1.6 and 2.4 inches). Elective means the surgery can be done at your convenience. This should not be put off too long if surgery is recommended.  If you smoke, stop immediately. Smoking is a major risk factor for  enlargement and rupture.  Medications may be used to help decrease complications  these include medicine to lower blood pressure and control cholesterol. HOME CARE INSTRUCTIONS   If you smoke, stop. Do not start smoking.  Take all medications as prescribed.  Your caregiver will tell you when to have your aneurysm rechecked, either by ultrasound or CT scan.  If your caregiver has given you a follow-up appointment, it is very important to keep that appointment. Not keeping the appointment could result in a chronic or permanent injury, pain, or disability. If there is any problem  keeping the appointment, you must call back to this facility for assistance. SEEK MEDICAL CARE IF:   You develop mild abdominal pain or pressure.  You are able to feel or perceive your aneurysm, and you sense any change. SEEK IMMEDIATE MEDICAL CARE IF:   You develop severe abdominal pain, or severe pain moving (radiating) to your back.  You suddenly develop cold or blue toes or feet.  You suddenly develop lightheadedness or fainting spells. MAKE SURE YOU:   Understand these instructions.  Will watch your condition.  Will get help right away if you are not doing well or get worse. Document Released: 03/06/2005 Document Revised: 08/19/2011 Document Reviewed: 12/29/2007 [facility] Patient Information 2014 [place], [place].   Peripheral Vascular Disease Peripheral Vascular Disease (PVD), also called Peripheral Arterial Disease (PAD), is a circulation problem caused by cholesterol (atherosclerotic plaque) deposits in the arteries. PVD commonly occurs in the lower extremities (legs) but it can occur in other areas of the body, such as your arms. The cholesterol buildup in the arteries reduces blood flow which can cause pain and other serious problems. The presence of PVD can place a person at risk for Coronary Artery Disease (CAD).  CAUSES  Causes of PVD can be many. It is usually associated with more than one risk factor such as:   High Cholesterol.  Smoking.  Diabetes.  Lack of exercise or inactivity.  High blood pressure (hypertension).  Obesity.  Family history. SYMPTOMS   When the lower extremities are affected, patients with PVD may experience:  Leg pain with exertion or physical activity. This is called INTERMITTENT CLAUDICATION. This may present as cramping or numbness with physical activity. The location of the pain is associated with the level of blockage. For example, blockage at the abdominal level (distal abdominal aorta) may result in buttock or hip pain. Lower leg  arterial blockage may result in calf pain.  As PVD becomes more severe, pain can develop with less physical activity.  In people with severe PVD, leg pain may occur at rest.  Other PVD signs and symptoms:  Leg numbness or weakness.  Coldness in the affected leg or foot, especially when compared to the other leg.  A change in leg color.  Patients with significant PVD are more prone to ulcers or sores on toes, feet or legs. These may take longer to heal or may reoccur. The ulcers or sores can become infected.  If signs and symptoms of PVD are ignored, gangrene may occur. This can result in the loss of toes or loss of an entire limb.  Not all leg pain is related to PVD. Other medical conditions can cause leg pain such as:  Blood clots (embolism) or Deep Vein Thrombosis.  Inflammation of the blood vessels (vasculitis).  Spinal stenosis. DIAGNOSIS  Diagnosis of PVD can involve several different types of tests. These can include:  Pulse Volume Recording Method (PVR). This test is simple,  painless and does not involve the use of X-rays. PVR involves measuring and comparing the blood pressure in the arms and legs. An ABI (Ankle-Brachial Index) is calculated. The normal ratio of blood pressures is 1. As this number becomes smaller, it indicates more severe disease.  < 0.95  indicates significant narrowing in one or more leg vessels.  <0.8 there will usually be pain in the foot, leg or buttock with exercise.  <0.4 will usually have pain in the legs at rest.  <0.25  usually indicates limb threatening PVD.  Doppler detection of pulses in the legs. This test is painless and checks to see if you have a pulses in your legs/feet.  A dye or contrast material (a substance that highlights the blood vessels so they show up on x-ray) may be given to help your caregiver better see the arteries for the following tests. The dye is eliminated from your body by the kidney's. Your caregiver may order  blood work to check your kidney function and other laboratory values before the following tests are performed:  Magnetic Resonance Angiography (MRA). An MRA is a picture study of the blood vessels and arteries. The MRA machine uses a large magnet to produce images of the blood vessels.  Computed Tomography Angiography (CTA). A CTA is a specialized x-ray that looks at how the blood flows in your blood vessels. An IV may be inserted into your arm so contrast dye can be injected.  Angiogram. Is a procedure that uses x-rays to look at your blood vessels. This procedure is minimally invasive, meaning a small incision (cut) is made in your groin. A small tube (catheter) is then inserted into the artery of your groin. The catheter is guided to the blood vessel or artery your caregiver wants to examine. Contrast dye is injected into the catheter. X-rays are then taken of the blood vessel or artery. After the images are obtained, the catheter is taken out. TREATMENT  Treatment of PVD involves many interventions which may include:  Lifestyle changes:  Quitting smoking.  Exercise.  Following a low fat, low cholesterol diet.  Control of diabetes.  Foot care is very important to the PVD patient. Good foot care can help prevent infection.  Medication:  Cholesterol-lowering medicine.  Blood pressure medicine.  Anti-platelet drugs.  Certain medicines may reduce symptoms of Intermittent Claudication.  Interventional/Surgical options:  Angioplasty. An Angioplasty is a procedure that inflates a balloon in the blocked artery. This opens the blocked artery to improve blood flow.  Stent Implant. A wire mesh tube (stent) is placed in the artery. The stent expands and stays in place, allowing the artery to remain open.  Peripheral Bypass Surgery. This is a surgical procedure that reroutes the blood around a blocked artery to help improve blood flow. This type of procedure may be performed if  Angioplasty or stent implants are not an option. SEEK IMMEDIATE MEDICAL CARE IF:   You develop pain or numbness in your arms or legs.  Your arm or leg turns cold, becomes blue in color.  You develop redness, warmth, swelling and pain in your arms or legs. MAKE SURE YOU:   Understand these instructions.  Will watch your condition.  Will get help right away if you are not doing well or get worse. Document Released: 07/04/2004 Document Revised: 08/19/2011 Document Reviewed: 05/31/2008 [facility] Patient Information 2014 [place], [place].

## 2013-03-03 NOTE — Addendum Note (Signed)
Addended by: Lorin Mercy K on: 03/03/2013 11:30 AM   Modules accepted: Orders

## 2013-03-16 ENCOUNTER — Ambulatory Visit: Payer: Medicare Other | Admitting: Internal Medicine

## 2013-03-18 ENCOUNTER — Ambulatory Visit: Payer: Medicare Other | Admitting: Internal Medicine

## 2013-04-13 ENCOUNTER — Other Ambulatory Visit (INDEPENDENT_AMBULATORY_CARE_PROVIDER_SITE_OTHER): Payer: Medicare Other

## 2013-04-13 DIAGNOSIS — E785 Hyperlipidemia, unspecified: Secondary | ICD-10-CM

## 2013-04-13 LAB — LIPID PANEL
Cholesterol: 208 mg/dL — ABNORMAL HIGH (ref 0–200)
HDL: 51.6 mg/dL (ref >39.00)
VLDL: 40.4 mg/dL — ABNORMAL HIGH (ref 0.0–40.0)

## 2013-04-13 LAB — LDL CHOLESTEROL, DIRECT: Direct LDL: 129.6 mg/dL

## 2013-04-13 LAB — HEPATIC FUNCTION PANEL
ALT: 29 U/L (ref 0–53)
AST: 21 U/L (ref 0–37)
Bilirubin, Direct: 0.1 mg/dL (ref 0.0–0.3)
Total Bilirubin: 0.7 mg/dL (ref 0.3–1.2)
Total Protein: 7.7 g/dL (ref 6.0–8.3)

## 2013-04-21 ENCOUNTER — Encounter: Payer: Self-pay | Admitting: Internal Medicine

## 2013-04-21 ENCOUNTER — Ambulatory Visit (INDEPENDENT_AMBULATORY_CARE_PROVIDER_SITE_OTHER): Payer: Medicare Other | Admitting: Internal Medicine

## 2013-04-21 VITALS — BP 112/62 | HR 68 | Temp 98.2°F | Ht 70.0 in | Wt 192.0 lb

## 2013-04-21 DIAGNOSIS — E785 Hyperlipidemia, unspecified: Secondary | ICD-10-CM

## 2013-04-21 DIAGNOSIS — I739 Peripheral vascular disease, unspecified: Secondary | ICD-10-CM

## 2013-04-21 DIAGNOSIS — M6281 Muscle weakness (generalized): Secondary | ICD-10-CM

## 2013-04-21 MED ORDER — PITAVASTATIN CALCIUM 4 MG PO TABS: 4.0000 mg | ORAL_TABLET | Freq: Every day | ORAL | Status: DC

## 2013-04-21 NOTE — Patient Instructions (Addendum)
Please complete the following lab tests before your next follow up appointment: FLP, LFTs - 272.4 Use Psyllium fiber as directed

## 2013-04-21 NOTE — Progress Notes (Signed)
Pre-visit discussion using our clinic review tool. No additional management support is needed unless otherwise documented below in the visit note.  

## 2013-04-22 NOTE — Assessment & Plan Note (Signed)
Sed rate was normal.  Improved with prednisone taper.   Lab Results  Component Value Date   ESRSEDRATE 25* 02/18/2013

## 2013-04-22 NOTE — Assessment & Plan Note (Signed)
Patient tolerating Livalo.  Titrate to 4 mg.  Dietary changes recommended and reviewed in detail.  Use psyllium fiber laxative.  Consider using Welchol.  It may also help is pre diabetes.

## 2013-04-22 NOTE — Assessment & Plan Note (Signed)
Followed by vascular specialist - Dr. Hart Rochester  Aorto-iliac Duplex (03/02/2013): 4.00 x 4.05 cm  Previous maximum measurements (02/27/2012): 3.67 x 3.64 cm  Carotid Duplex (03/02/2013): Bilateral internal carotid arteries demonstrate <40% stenosis; this is unchanged since previous study on 02/27/12.  Left Lower Extremity arterial Duplex (03/02/2013): Elevated velocities at the left superficial femoral artery proximal stent segment suggestive of greater than 50% stenosis. Remainder of stent is patent with homogenous plaque noted throughout.  ABI's:  Right: 1.06 with triphasic waveforms  Left: 1.08 with triphasic waveforms, stable compared to 02/27/12.

## 2013-04-22 NOTE — Progress Notes (Signed)
Subjective:    Patient ID: Samuel [name], male    DOB: 1946-03-27, 67 y.o.   MRN: [id number]  HPI  67 y/o white male with hx of CAD, PAD and hyperlipidemia for follow up.  Several months ago, he had to discontinue atorvastatin due to myalgia symptoms.  He was treated with prednisone taper then transitioned to Livalo.  He is taking 2 mg once daily.   He reports he his tolerating this medication.  We also discussed his current diet.  PAD - recently seen by vascular specialist.  His disease is stable.  Recent lipid panel reviewed.  Review of Systems Negative for myalgia, occasional shoulder stiffness    Past Medical History  Diagnosis Date  . Hyperlipidemia   . Hypertension   . History of carotid stenosis     s/p right carotid endarterectomy 12/06  . Genital warts     history of  . PAD (peripheral artery disease)     Left leg  . Carotid artery occlusion   . AAA (abdominal aortic aneurysm)     History   Social History  . Marital Status: Married    Spouse Name: N/A    Number of Children: N/A  . Years of Education: N/A   Occupational History  .      self employed - [employer]   Social History Main Topics  . Smoking status: Former Games developer  . Smokeless tobacco: Never Used     Comment: quit- for porlonged periods he has smoked 2-3 packs a day  . Alcohol Use: Yes  . Drug Use: No  . Sexual Activity: Not on file   Other Topics Concern  . Not on file   Social History Narrative   Married -to his second wife [name]).  Has a daughter from his first marriage who is in her early 82s.     He drinks approximately 4 alcoholic beverages, beer, a day.  He has done so since age 52.  Denies any binge drinking or heavy alcohol use.  Quit tobacco 2 year ago.  He has smoked on and off x20-30 years.  For prolonged periods he has smoked 2-3 packs a day.     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  She lives in [place], [place]    Past  Surgical History  Procedure Laterality Date  . Knee surgery  1962  . Appendectomy  1960  . Carotid endarterectomy  05/2005    right  . Left leg stents      Family History  Problem Relation Age of Onset  . Stroke Mother     deceased age 63 had her firsrt CVA age  42  . Peripheral vascular disease Brother   . Peripheral vascular disease Father     status post leg amputation  secondary to PVD    Allergies  Allergen Reactions  . Lipitor [Atorvastatin] Other (See Comments)    Muscle pain    Current Outpatient Prescriptions on File Prior to Visit  Medication Sig Dispense Refill  . lisinopril-hydrochlorothiazide (PRINZIDE,ZESTORETIC) 10-12.5 MG per tablet Take 1 tablet by mouth daily.  90 tablet  3  . Multiple Vitamin (MULTIVITAMIN) tablet Take 1 tablet by mouth daily.      . vardenafil (LEVITRA) 20 MG tablet Take 20 mg by mouth daily as needed.         No current facility-administered medications on file prior to visit.    BP 112/62  Pulse 68  Temp(Src) 98.2 F (36.8 C) (Oral)  Ht 5\' 10"  (1.778 m)  Wt 192 lb (87.091 kg)  BMI 27.55 kg/m2    Objective:   Physical Exam  Constitutional: He is oriented to person, place, and time. He appears well-developed and well-nourished. No distress.  HENT:  Head: Normocephalic and atraumatic.  Neck: Neck supple.  Cardiovascular: Normal rate, regular rhythm and normal heart sounds.   No murmur heard. Pulmonary/Chest: Effort normal. He has no wheezes.  Musculoskeletal: He exhibits no edema.  Muscle strength 5 out of 5 throughout  Neurological: He is alert and oriented to person, place, and time. No cranial nerve deficit.  Skin: Skin is warm and dry.  Psychiatric: He has a normal mood and affect. His behavior is normal.          Assessment & Plan:

## 2013-04-27 ENCOUNTER — Ambulatory Visit: Payer: Medicare Other | Admitting: Internal Medicine

## 2013-04-29 ENCOUNTER — Ambulatory Visit: Payer: Medicare Other | Admitting: Internal Medicine

## 2013-04-30 ENCOUNTER — Other Ambulatory Visit: Payer: Self-pay | Admitting: Internal Medicine

## 2013-05-31 ENCOUNTER — Telehealth: Payer: Self-pay | Admitting: Internal Medicine

## 2013-05-31 DIAGNOSIS — E785 Hyperlipidemia, unspecified: Secondary | ICD-10-CM

## 2013-05-31 NOTE — Telephone Encounter (Signed)
Pt has was switched Pitavastatin Calcium (LIVALO) 4 MG TABS Now pt is having some more issues w/ this drug as the other statin he has tried. Hips and shoulders are aching again, just like as on the lipitor. Pt states he must switched to something else. Will not take this med again. [facility] [address]

## 2013-06-01 NOTE — Telephone Encounter (Signed)
Pt was on atorvastatin and had muscle aches and was switch was to Pitavastatin.  He is having same issues with the the livalo.  Please advise

## 2013-06-04 NOTE — Telephone Encounter (Signed)
Ok to stop Livalo.  Refer pt to lipid clinic.

## 2013-06-04 NOTE — Telephone Encounter (Signed)
Left message for pt to call back  °

## 2013-06-08 NOTE — Telephone Encounter (Signed)
Referral order placed.

## 2013-08-03 ENCOUNTER — Other Ambulatory Visit: Payer: Self-pay | Admitting: Family

## 2013-08-03 DIAGNOSIS — I714 Abdominal aortic aneurysm, without rupture, unspecified: Secondary | ICD-10-CM

## 2013-08-03 DIAGNOSIS — Z48812 Encounter for surgical aftercare following surgery on the circulatory system: Secondary | ICD-10-CM

## 2013-08-03 DIAGNOSIS — I6529 Occlusion and stenosis of unspecified carotid artery: Secondary | ICD-10-CM

## 2013-08-12 ENCOUNTER — Other Ambulatory Visit: Payer: Medicare Other

## 2013-08-20 ENCOUNTER — Ambulatory Visit: Payer: Medicare Other | Admitting: Internal Medicine

## 2013-08-25 ENCOUNTER — Other Ambulatory Visit (INDEPENDENT_AMBULATORY_CARE_PROVIDER_SITE_OTHER): Payer: Medicare Other

## 2013-08-25 DIAGNOSIS — E785 Hyperlipidemia, unspecified: Secondary | ICD-10-CM

## 2013-08-25 LAB — LIPID PANEL
CHOLESTEROL: 217 mg/dL — AB (ref 0–200)
HDL: 41.8 mg/dL (ref >39.00)
LDL CALC: 131 mg/dL — AB (ref 0–99)
TRIGLYCERIDES: 220 mg/dL — AB (ref 0.0–149.0)
Total CHOL/HDL Ratio: 5
VLDL: 44 mg/dL — AB (ref 0.0–40.0)

## 2013-08-25 LAB — HEPATIC FUNCTION PANEL
ALBUMIN: 4.4 g/dL (ref 3.5–5.2)
ALK PHOS: 49 U/L (ref 39–117)
ALT: 22 U/L (ref 0–53)
AST: 19 U/L (ref 0–37)
Bilirubin, Direct: 0 mg/dL (ref 0.0–0.3)
TOTAL PROTEIN: 7.9 g/dL (ref 6.0–8.3)
Total Bilirubin: 0.5 mg/dL (ref 0.3–1.2)

## 2013-09-01 ENCOUNTER — Ambulatory Visit (INDEPENDENT_AMBULATORY_CARE_PROVIDER_SITE_OTHER): Payer: Medicare Other | Admitting: Internal Medicine

## 2013-09-01 ENCOUNTER — Encounter: Payer: Self-pay | Admitting: Internal Medicine

## 2013-09-01 VITALS — BP 110/70 | Temp 97.7°F | Wt 189.0 lb

## 2013-09-01 DIAGNOSIS — I10 Essential (primary) hypertension: Secondary | ICD-10-CM

## 2013-09-01 DIAGNOSIS — E785 Hyperlipidemia, unspecified: Secondary | ICD-10-CM

## 2013-09-01 MED ORDER — VITAMIN D 50 MCG (2000 UT) PO CAPS: ORAL_CAPSULE | ORAL | Status: DC

## 2013-09-01 MED ORDER — LISINOPRIL-HYDROCHLOROTHIAZIDE 10-12.5 MG PO TABS: 1.0000 | ORAL_TABLET | Freq: Every day | ORAL | Status: DC

## 2013-09-01 MED ORDER — TADALAFIL 20 MG PO TABS: ORAL_TABLET | ORAL | Status: DC

## 2013-09-01 NOTE — Assessment & Plan Note (Addendum)
Patient only able to tolerate Livalo for 3-4 weeks before experiencing muscle pains and weakness.  He may be statin intolerant.  Consider referral to lipid clinic.    Low vitamin D may be predisposing patient to statin intolerance.  Patient advised to start OTC vitamin D3 replacement.  Consider another trial of statin therapy.

## 2013-09-01 NOTE — Progress Notes (Signed)
Subjective:    Patient ID: Samuel [name], male    DOB: July 09, 1945, 68 y.o.   MRN: [id number]  HPI  68 year old white male with history of abdominal aneurysm, peripheral vascular disease and hyperlipidemia for followup. At previous visit patient started on little oh. He was intolerant to atorvastatin.  Patient reports he took medication for approximately one month before he started experiencing proximal muscle aches and weakness. He discontinued medication and his symptoms resolved within one week.  He is self employed Programmer, [employer].  He is not able to always follow low saturated fat diet.  Hypertension - stable.  Review of Systems Negative for chest pain     Past Medical History  Diagnosis Date  . Hyperlipidemia   . Hypertension   . History of carotid stenosis     s/p right carotid endarterectomy 12/06  . Genital warts     history of  . PAD (peripheral artery disease)     Left leg  . Carotid artery occlusion   . AAA (abdominal aortic aneurysm)     History   Social History  . Marital Status: Married    Spouse Name: N/A    Number of Children: N/A  . Years of Education: N/A   Occupational History  .      self employed - [employer]   Social History Main Topics  . Smoking status: Former Research scientist (life sciences)  . Smokeless tobacco: Never Used     Comment: quit- for porlonged periods he has smoked 2-3 packs a day  . Alcohol Use: Yes  . Drug Use: No  . Sexual Activity: Not on file   Other Topics Concern  . Not on file   Social History Narrative   Married -to his second wife Samuel [name]).  Has a daughter from his first marriage who is in her early 54s.     He drinks approximately 4 alcoholic beverages, beer, a day.  He has done so since age 89.  Denies any binge drinking or heavy alcohol use.  Quit tobacco 2 year ago.  He has smoked on and off x20-30 years.  For prolonged periods he has smoked 2-3 packs a day.     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  She lives in [place], [place]    Past Surgical History  Procedure Laterality Date  . Knee surgery  1962  . Appendectomy  1960  . Carotid endarterectomy  05/2005    right  . Left leg stents      Family History  Problem Relation Age of Onset  . Stroke Mother     deceased age 19 had her firsrt CVA age  60  . Peripheral vascular disease Brother   . Peripheral vascular disease Father     status post leg amputation  secondary to PVD    Allergies  Allergen Reactions  . Lipitor [Atorvastatin] Other (See Comments)    Muscle pain    Current Outpatient Prescriptions on File Prior to Visit  Medication Sig Dispense Refill  . lisinopril-hydrochlorothiazide (PRINZIDE,ZESTORETIC) 10-12.5 MG per tablet TAKE 1 TABLET BY MOUTH EVERY DAY  90 tablet  2  . Multiple Vitamin (MULTIVITAMIN) tablet Take 1 tablet by mouth daily.      . vardenafil (LEVITRA) 20 MG tablet Take 20 mg by mouth daily as needed.         No current facility-administered medications on file prior to visit.  BP 110/70  Temp(Src) 97.7 F (36.5 C) (Oral)  Wt 189 lb (85.73 kg)    Objective:   Physical Exam  Constitutional: He is oriented to person, place, and time. He appears well-developed and well-nourished. No distress.  HENT:  Head: Normocephalic and atraumatic.  Right Ear: External ear normal.  Left Ear: External ear normal.  Neck: Neck supple.  No carotid bruit  Cardiovascular: Normal rate, regular rhythm, normal heart sounds and intact distal pulses.   No murmur heard. Pulmonary/Chest: Effort normal and breath sounds normal. He has no wheezes.  Musculoskeletal: He exhibits no edema.  Neurological: He is alert and oriented to person, place, and time. No cranial nerve deficit.  Skin: Skin is warm and dry.       Assessment & Plan:

## 2013-09-01 NOTE — Progress Notes (Signed)
Pre visit review using our clinic review tool, if applicable. No additional management support is needed unless otherwise documented below in the visit note. 

## 2013-09-01 NOTE — Patient Instructions (Signed)
Schedule next office visit as CPX Please complete the following lab tests before your next follow up appointment: BMET, CBCD - 401.9 FLP, LFTs, TSH - 272.4 PSA - V76.44

## 2013-09-01 NOTE — Assessment & Plan Note (Signed)
Well controlled.  BP: 110/70 mmHg

## 2013-09-13 ENCOUNTER — Telehealth: Payer: Self-pay | Admitting: Internal Medicine

## 2013-09-13 NOTE — Telephone Encounter (Signed)
Pt states he received a copy of his meds list on his last visit and he noticed that the rx pitavastatin is still showing on the list. Pt states he no longer takes that medicine and wants it removed from his records/med list.

## 2013-09-13 NOTE — Telephone Encounter (Signed)
This is not on med list? 

## 2013-12-28 ENCOUNTER — Telehealth: Payer: Self-pay | Admitting: Internal Medicine

## 2013-12-28 NOTE — Telephone Encounter (Signed)
Dr Shawna Orleans is out of the office all week, pt needs to make an appt with any provider

## 2013-12-28 NOTE — Telephone Encounter (Signed)
I contacted the Samuel [name] to schedule an appt, Samuel [name] stated he was not going to pay for an office visit for an rx that dr. Shawna Orleans had already written for him. Samuel [name] requesting a call back from dr. Shawna Orleans next week.

## 2013-12-28 NOTE — Telephone Encounter (Signed)
Pt is needing a rx for gout flare ups states dr. Shawna Orleans had provided him something once before however he does not remember what it was . Send to [employer] [address]

## 2014-01-03 NOTE — Telephone Encounter (Signed)
Pt following up, requesting to speak with dr. Shawna Orleans

## 2014-02-01 ENCOUNTER — Ambulatory Visit (INDEPENDENT_AMBULATORY_CARE_PROVIDER_SITE_OTHER): Payer: Medicare Other | Admitting: Family Medicine

## 2014-02-01 VITALS — BP 130/80 | HR 77 | Temp 97.3°F | Resp 16 | Ht 70.0 in | Wt 186.4 lb

## 2014-02-01 DIAGNOSIS — M719 Bursopathy, unspecified: Secondary | ICD-10-CM

## 2014-02-01 DIAGNOSIS — IMO0002 Reserved for concepts with insufficient information to code with codable children: Secondary | ICD-10-CM

## 2014-02-01 DIAGNOSIS — M6790 Unspecified disorder of synovium and tendon, unspecified site: Secondary | ICD-10-CM

## 2014-02-01 NOTE — Progress Notes (Addendum)
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 Last week he was working the golf tournament here in town and he jammed his right little finger. He has been unable to straighten it since and he's had swelling and ecchymosis just proximal to the nail ever since.  Objective: Patient has relatively normal range of motion of his right pinky finger with exception of incomplete extension of the distal phalanx. He is mildly tender there and there is some ecchymosis as well. He also has swelling just proximal to the nail.  Assessment: Partial avulsion of the extensor tendon  Plan: Explained to patient that he needs to keep this fully extended and I fashioned a splint. He's to keep this on for 6 weeks. Patient explains that he thinks he can do it although he isn't very busy with the cooking this is an silk screening.  Signed, Robyn Haber, MD

## 2014-03-04 ENCOUNTER — Ambulatory Visit (INDEPENDENT_AMBULATORY_CARE_PROVIDER_SITE_OTHER): Payer: Medicare Other | Admitting: Physician Assistant

## 2014-03-04 ENCOUNTER — Ambulatory Visit: Payer: Medicare Other | Admitting: Internal Medicine

## 2014-03-04 ENCOUNTER — Encounter: Payer: Self-pay | Admitting: Physician Assistant

## 2014-03-04 VITALS — BP 120/74 | HR 72 | Temp 98.0°F | Resp 18 | Wt 186.4 lb

## 2014-03-04 DIAGNOSIS — I10 Essential (primary) hypertension: Secondary | ICD-10-CM

## 2014-03-04 DIAGNOSIS — M10379 Gout due to renal impairment, unspecified ankle and foot: Secondary | ICD-10-CM

## 2014-03-04 DIAGNOSIS — N529 Male erectile dysfunction, unspecified: Secondary | ICD-10-CM

## 2014-03-04 DIAGNOSIS — M1030 Gout due to renal impairment, unspecified site: Secondary | ICD-10-CM

## 2014-03-04 LAB — BASIC METABOLIC PANEL
BUN: 16 mg/dL (ref 6–23)
CHLORIDE: 103 meq/L (ref 96–112)
CO2: 32 mEq/L (ref 19–32)
Calcium: 9.9 mg/dL (ref 8.4–10.5)
Creatinine, Ser: 1 mg/dL (ref 0.4–1.5)
GFR: 77.6 mL/min (ref >60.00)
Glucose, Bld: 91 mg/dL (ref 70–99)
Potassium: 4.7 mEq/L (ref 3.5–5.1)
Sodium: 141 mEq/L (ref 135–145)

## 2014-03-04 MED ORDER — LISINOPRIL-HYDROCHLOROTHIAZIDE 10-12.5 MG PO TABS: 1.0000 | ORAL_TABLET | Freq: Every day | ORAL | Status: DC

## 2014-03-04 MED ORDER — TADALAFIL 20 MG PO TABS: ORAL_TABLET | ORAL | Status: DC

## 2014-03-04 MED ORDER — PREDNISONE 20 MG PO TABS: 40.0000 mg | ORAL_TABLET | Freq: Every day | ORAL | Status: DC

## 2014-03-04 NOTE — Patient Instructions (Addendum)
We will call you with your lab results when they are available.  Prednisone 2 pills daily with breakfast. Make sure to take with food to prevent nausea.  Cialis as directed.  Continue current medications as directed.  If emergency symptoms discussed during visit developed, seek medical attention immediately.  Followup as needed, or for worsening or persistent symptoms despite treatment.    Hypertension Hypertension is another name for high blood pressure. High blood pressure forces your heart to work harder to pump blood. A blood pressure reading has two numbers, which includes a higher number over a lower number (example: 110/72). HOME CARE   Have your blood pressure rechecked by your doctor.  Only take medicine as told by your doctor. Follow the directions carefully. The medicine does not work as well if you skip doses. Skipping doses also puts you at risk for problems.  Do not smoke.  Monitor your blood pressure at home as told by your doctor. GET HELP IF:  You think you are having a reaction to the medicine you are taking.  You have repeat headaches or feel dizzy.  You have puffiness (swelling) in your ankles.  You have trouble with your vision. GET HELP RIGHT AWAY IF:   You get a very bad headache and are confused.  You feel weak, numb, or faint.  You get chest or belly (abdominal) pain.  You throw up (vomit).  You cannot breathe very well. MAKE SURE YOU:   Understand these instructions.  Will watch your condition.  Will get help right away if you are not doing well or get worse. Document Released: 11/13/2007 Document Revised: 06/01/2013 Document Reviewed: 03/19/2013 [facility] Patient Information 2015 [place], [place]. This information is not intended to replace advice given to you by your health care provider. Make sure you discuss any questions you have with your health care provider. Gout Gout is when your joints become red, sore, and swell (inflamed).  This is caused by the buildup of uric acid crystals in the joints. Uric acid is a chemical that is normally in the blood. If the level of uric acid gets too high in the blood, these crystals form in your joints and tissues. Over time, these crystals can form into masses near the joints and tissues. These masses can destroy bone and cause the bone to look misshapen (deformed). HOME CARE   Do not take aspirin for pain.  Only take medicine as told by your doctor.  Rest the joint as much as you can. When in bed, keep sheets and blankets off painful areas.  Keep the sore joints raised (elevated).  Put warm or cold packs on painful joints. Use of warm or cold packs depends on which works best for you.  Use crutches if the painful joint is in your leg.  Drink enough fluids to keep your pee (urine) clear or pale yellow. Limit alcohol, sugary drinks, and drinks with fructose in them.  Follow your diet instructions. Pay careful attention to how much protein you eat. Include fruits, vegetables, whole grains, and fat-free or low-fat milk products in your daily diet. Talk to your doctor or dietitian about the use of coffee, vitamin C, and cherries. These may help lower uric acid levels.  Keep a healthy body weight. GET HELP RIGHT AWAY IF:   You have watery poop (diarrhea), throw up (vomit), or have any side effects from medicines.  You do not feel better in 24 hours, or you are getting worse.  Your joint becomes  suddenly more tender, and you have chills or a fever. MAKE SURE YOU:   Understand these instructions.  Will watch your condition.  Will get help right away if you are not doing well or get worse. Document Released: 03/05/2008 Document Revised: 10/11/2013 Document Reviewed: 01/08/2012 [facility] Patient Information 2015 [place], [place]. This information is not intended to replace advice given to you by your health care provider. Make sure you discuss any questions you have with your health  care provider.

## 2014-03-04 NOTE — Progress Notes (Signed)
Subjective:    Patient ID: Samuel [name], male    DOB: 1945/09/08, 68 y.o.   MRN: [id number]  HPI Patient is a 68 y.o. male presenting for follow up and medication management. HTN- Controlled lisinopril-HCTZ. He states that he is tolerating this well and denies adverse effects to treatment. Patient denies fevers, chills, nausea, vomiting, diarrhea, shortness of breath, chest pain, headache, syncope.  Erectile dysfunction- Pt is controlled on Cialis. Pt states this works well, he tolerates this well and denies adverse effects to treatment. Requesting a refill.  Acute Gout- patient has a history of gout flares in his great toe and ankle joints. He states that currently he is developing a gout flare in his right ankle. He notes typical swelling and redness and tenderness to light palpation. He states he has been trying Aleve for this however this is not helped a whole lot as of yet.  Review of Systems As per HPI and are otherwise negative.   Past Medical History  Diagnosis Date  . Hyperlipidemia   . Hypertension   . History of carotid stenosis     s/p right carotid endarterectomy 12/06  . Genital warts     history of  . PAD (peripheral artery disease)     Left leg  . Carotid artery occlusion   . AAA (abdominal aortic aneurysm)     History   Social History  . Marital Status: Married    Spouse Name: N/A    Number of Children: N/A  . Years of Education: N/A   Occupational History  .      self employed - [employer]   Social History Main Topics  . Smoking status: Former Research scientist (life sciences)  . Smokeless tobacco: Never Used     Comment: quit- for porlonged periods he has smoked 2-3 packs a day  . Alcohol Use: Yes  . Drug Use: No  . Sexual Activity: Not on file   Other Topics Concern  . Not on file   Social History Narrative   Married -to his second wife Samuel [name]).  Has a daughter from his first marriage who is in her early 74s.     He drinks approximately 4 alcoholic beverages, beer,  a day.  He has done so since age 68.  Denies any binge drinking or heavy alcohol use.  Quit tobacco 2 year ago.  He has smoked on and off x20-30 years.  For prolonged periods he has smoked 2-3 packs a day.     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  She lives in [place], [place]    Past Surgical History  Procedure Laterality Date  . Knee surgery  1962  . Appendectomy  1960  . Carotid endarterectomy  05/2005    right  . Left leg stents      Family History  Problem Relation Age of Onset  . Stroke Mother     deceased age 28 had her firsrt CVA age  53  . Peripheral vascular disease Brother   . Peripheral vascular disease Father     status post leg amputation  secondary to PVD    Allergies  Allergen Reactions  . Lipitor [Atorvastatin] Other (See Comments)    Muscle pain  . Livalo [Pitavastatin] Other (See Comments)    Muscle aches  . Statins     Current Outpatient Prescriptions on File Prior to Visit  Medication Sig Dispense Refill  . Cholecalciferol (VITAMIN  D) 2000 UNITS CAPS Take one to two caps daily  30 capsule    . Multiple Vitamin (MULTIVITAMIN) tablet Take 1 tablet by mouth daily.       No current facility-administered medications on file prior to visit.   The PFS histories were reviewed with the patient at time of visit.  EXAM: BP 120/74  Pulse 72  Temp(Src) 98 F (36.7 C) (Oral)  Resp 18  Wt 186 lb 6.4 oz (84.55 kg)     Objective:   Physical Exam  Nursing note and vitals reviewed. Constitutional: He is oriented to person, place, and time. He appears well-developed and well-nourished. No distress.  HENT:  Head: Normocephalic and atraumatic.  Eyes: Conjunctivae and EOM are normal. Pupils are equal, round, and reactive to light.  Cardiovascular: Normal rate, regular rhythm, normal heart sounds and intact distal pulses.   Pulmonary/Chest: Effort normal and breath sounds normal. No respiratory distress. He  has no wheezes. He has no rales. He exhibits no tenderness.  Musculoskeletal: Normal range of motion. He exhibits tenderness (right ankle ttp). He exhibits no edema.  Right medial ankle is ttp, warm to touch. No visible erythema, no fluctuance.  Gait normal.  Neurological: He is alert and oriented to person, place, and time.  Skin: Skin is warm and dry. No rash noted. He is not diaphoretic. No erythema. No pallor.  Psychiatric: He has a normal mood and affect. His behavior is normal. Judgment and thought content normal.     Lab Results  Component Value Date   WBC 7.9 02/18/2013   HGB 13.1 02/18/2013   HCT 38.0* 02/18/2013   PLT 190.0 02/18/2013   GLUCOSE 87 02/18/2013   CHOL 217* 08/25/2013   TRIG 220.0* 08/25/2013   HDL 41.80 08/25/2013   LDLDIRECT 129.6 04/13/2013   LDLCALC 131* 08/25/2013   ALT 22 08/25/2013   AST 19 08/25/2013   NA 141 02/18/2013   K 4.1 02/18/2013   CL 107 02/18/2013   CREATININE 0.9 02/18/2013   BUN 19 02/18/2013   CO2 27 02/18/2013   TSH 0.68 02/18/2013   HGBA1C 5.9 03/26/2012        Assessment & Plan:  Samuel [name] was seen today for follow-up.  Diagnoses and associated orders for this visit:  Unspecified essential hypertension Comments: Stable on current regimen, continue, obtain BMP. - lisinopril-hydrochlorothiazide (PRINZIDE,ZESTORETIC) 10-12.5 MG per tablet; Take 1 tablet by mouth daily. - Basic Metabolic Panel  Acute gout due to renal impairment involving ankle, unspecified laterality Comments: Rx prednisone course. - predniSONE (DELTASONE) 20 MG tablet; Take 2 tablets (40 mg total) by mouth daily with breakfast.  Erectile dysfunction, unspecified erectile dysfunction type Comments: Controlled with Cialis, provide refill. - Discontinue: tadalafil (CIALIS) 20 MG tablet; Use 1/2 to 1 tab once daily as needed. - tadalafil (CIALIS) 20 MG tablet; Use 1/2 to 1 tab once daily as needed.    Return precautions provided, and patient handout on gout, htn.  Plan  to follow up as needed, or for worsening or persistent symptoms despite treatment.  Patient Instructions  We will call you with your lab results when they are available.  Prednisone 2 pills daily with breakfast. Make sure to take with food to prevent nausea.  Cialis as directed.  Continue current medications as directed.  If emergency symptoms discussed during visit developed, seek medical attention immediately.  Followup as needed, or for worsening or persistent symptoms despite treatment.

## 2014-03-04 NOTE — Progress Notes (Signed)
Pre visit review using our clinic review tool, if applicable. No additional management support is needed unless otherwise documented below in the visit note. 

## 2014-03-15 ENCOUNTER — Encounter (HOSPITAL_COMMUNITY): Payer: Medicare Other

## 2014-03-15 ENCOUNTER — Ambulatory Visit: Payer: Medicare Other | Admitting: Family

## 2014-03-15 ENCOUNTER — Other Ambulatory Visit (HOSPITAL_COMMUNITY): Payer: Medicare Other

## 2014-03-18 ENCOUNTER — Encounter (HOSPITAL_COMMUNITY): Payer: Medicare Other

## 2014-03-18 ENCOUNTER — Inpatient Hospital Stay (INDEPENDENT_AMBULATORY_CARE_PROVIDER_SITE_OTHER)
Admission: RE | Admit: 2014-03-18 | Discharge: 2014-03-18 | Disposition: A | Discharge status: Home / Self Care | Payer: Medicare Other | Source: Ambulatory Visit | Attending: Family | Admitting: Family

## 2014-03-18 ENCOUNTER — Ambulatory Visit: Payer: Medicare Other | Admitting: Family

## 2014-03-18 ENCOUNTER — Other Ambulatory Visit (HOSPITAL_COMMUNITY): Payer: Medicare Other

## 2014-03-18 DIAGNOSIS — I739 Peripheral vascular disease, unspecified: Secondary | ICD-10-CM

## 2014-03-18 DIAGNOSIS — I6529 Occlusion and stenosis of unspecified carotid artery: Secondary | ICD-10-CM

## 2014-03-18 DIAGNOSIS — Z48812 Encounter for surgical aftercare following surgery on the circulatory system: Secondary | ICD-10-CM

## 2014-03-18 DIAGNOSIS — I714 Abdominal aortic aneurysm, without rupture, unspecified: Secondary | ICD-10-CM

## 2014-04-14 ENCOUNTER — Encounter: Payer: Self-pay | Admitting: Family

## 2014-04-15 ENCOUNTER — Ambulatory Visit (INDEPENDENT_AMBULATORY_CARE_PROVIDER_SITE_OTHER): Payer: Medicare Other | Admitting: Family

## 2014-04-15 ENCOUNTER — Ambulatory Visit (HOSPITAL_COMMUNITY)
Admission: RE | Admit: 2014-04-15 | Discharge: 2014-04-15 | Disposition: A | Discharge status: Home / Self Care | Payer: Medicare Other | Source: Ambulatory Visit | Attending: Family | Admitting: Family

## 2014-04-15 ENCOUNTER — Encounter: Payer: Self-pay | Admitting: Family

## 2014-04-15 VITALS — BP 119/78 | HR 66 | Resp 16 | Ht 70.0 in | Wt 187.0 lb

## 2014-04-15 DIAGNOSIS — I6529 Occlusion and stenosis of unspecified carotid artery: Secondary | ICD-10-CM

## 2014-04-15 DIAGNOSIS — I739 Peripheral vascular disease, unspecified: Secondary | ICD-10-CM

## 2014-04-15 DIAGNOSIS — I714 Abdominal aortic aneurysm, without rupture, unspecified: Secondary | ICD-10-CM

## 2014-04-15 NOTE — Progress Notes (Signed)
[facility] [place] HISTORY AND PHYSICAL   MRN : [id number]  History of Present Illness:   Samuel [name] is a 68 y.o. male patient of Dr. Kellie Simmering who is status post left superficial femoral artery stent was placed by Dr. Trula Slade in April 2012, the patient's also under regular surveillance for a small known AAA as well as carotid and is status post a right CEA in December of 2006 by Dr. Kellie Simmering.  Bilateral shoulder and hip pain resolved this has been determined to be from Lipitor use and pain has since resolved since Lipitor stopped.   Pt. denies claudication in legs.  Pt. denies rest pain; denies night pain denies non healing ulcers on lower extremities. Patient has Negative history of TIA or stroke symptom. The patient denies amaurosis fugax or monocular blindness. The patient denies facial drooping. Pt. denies hemiplegia. The patient denies receptive or expressive aphasia. Pt. denies weakness. The patient has not had back or abdominal pain.  Pt Diabetic: "borderline" Pt smoker: smoker , quit in 2004  Pt meds include: Statin :no, caused myalgias Betablocker: No ASA: Yes, 81 mg daily Other anticoagulants/antiplatelets: none     Current Outpatient Prescriptions  Medication Sig Dispense Refill  . Cholecalciferol (VITAMIN D) 2000 UNITS CAPS Take one to two caps daily 30 capsule   . lisinopril-hydrochlorothiazide (PRINZIDE,ZESTORETIC) 10-12.5 MG per tablet Take 1 tablet by mouth daily. 90 tablet 1  . Multiple Vitamin (MULTIVITAMIN) tablet Take 1 tablet by mouth daily.    . predniSONE (DELTASONE) 20 MG tablet Take 2 tablets (40 mg total) by mouth daily with breakfast. (Patient taking differently: Take 40 mg by mouth as needed. ) 10 tablet 0  . tadalafil (CIALIS) 20 MG tablet Use 1/2 to 1 tab once daily as needed. 3 tablet 0   No current facility-administered medications for this visit.    Past Medical History  Diagnosis Date  . Hyperlipidemia   .  Hypertension   . History of carotid stenosis     s/p right carotid endarterectomy 12/06  . Genital warts     history of  . PAD (peripheral artery disease)     Left leg  . Carotid artery occlusion   . AAA (abdominal aortic aneurysm)     Social History History  Substance Use Topics  . Smoking status: Former Research scientist (life sciences)  . Smokeless tobacco: Never Used     Comment: quit- for porlonged periods he has smoked 2-3 packs a day  . Alcohol Use: Yes    Family History Family History  Problem Relation Age of Onset  . Stroke Mother     deceased age 33 had her firsrt CVA age  44  . Peripheral vascular disease Brother   . Peripheral vascular disease Father     status post leg amputation  secondary to PVD    Surgical History Past Surgical History  Procedure Laterality Date  . Knee surgery  1962  . Appendectomy  1960  . Carotid endarterectomy  05/2005    right  . Left leg stents  2012    Allergies  Allergen Reactions  . Lipitor [Atorvastatin] Other (See Comments)    Muscle pain  . Livalo [Pitavastatin] Other (See Comments)    Muscle aches  . Statins     Current Outpatient Prescriptions  Medication Sig Dispense Refill  . Cholecalciferol (VITAMIN D) 2000 UNITS CAPS Take one to two caps daily 30 capsule   . lisinopril-hydrochlorothiazide (PRINZIDE,ZESTORETIC) 10-12.5 MG per tablet Take 1 tablet by  mouth daily. 90 tablet 1  . Multiple Vitamin (MULTIVITAMIN) tablet Take 1 tablet by mouth daily.    . predniSONE (DELTASONE) 20 MG tablet Take 2 tablets (40 mg total) by mouth daily with breakfast. (Patient taking differently: Take 40 mg by mouth as needed. ) 10 tablet 0  . tadalafil (CIALIS) 20 MG tablet Use 1/2 to 1 tab once daily as needed. 3 tablet 0   No current facility-administered medications for this visit.     REVIEW OF SYSTEMS: See HPI for pertinent positives and negatives.  Physical Examination Filed Vitals:   04/15/14 0957 04/15/14 0959  BP: 118/71 119/78  Pulse: 68 66   Resp:  16  Height:  5\' 10"  (1.778 m)  Weight:  187 lb (84.823 kg)  SpO2:  100%   Body mass index is 26.83 kg/(m^2).  General: WDWN in NAD Gait: Normal HENT: WNL Eyes: Pupils equal Pulmonary: normal non-labored breathing , without Rales, rhonchi, wheezing Cardiac: RRR, no detected murmurs  Abdomen: soft, NT, no palpated masses Skin: no rashes, ulcers noted; no Gangrene , no cellulitis; no open wounds   VASCULAR EXAM  Carotid Bruits Left Right   Negative Negative  Aorta is not palpable Bilateral radial pulses are palpable at 2+   VASCULAR EXAM: Extremities without ischemic changes  without Gangrene ; without open wounds.     LE Pulses LEFT RIGHT   FEMORAL  palpable  palpable    POPLITEAL not palpable  not palpable   POSTERIOR TIBIAL  palpable   palpable    DORSALIS PEDIS  ANTERIOR TIBIAL palpable  palpable      Musculoskeletal: no muscle wasting or atrophy; no peripheral edema Neurologic: A&O X 3; Appropriate Affect ;  SENSATION: normal; MOTOR FUNCTION: 5/5 Symmetric, CN 2-12 intact Speech is fluent/normal    Non-Invasive Vascular Imaging (04/15/2014):   CEREBROVASCULAR DUPLEX EVALUATION    INDICATION: Carotid artery disease     PREVIOUS INTERVENTION(S): Right carotid endarterectomy 05/31/2005.    DUPLEX EXAM:     RIGHT  LEFT  Peak Systolic Velocities (cm/s) End Diastolic Velocities (cm/s) Plaque LOCATION Peak Systolic Velocities (cm/s) End Diastolic Velocities (cm/s) Plaque  58 17  CCA PROXIMAL 120 24   53 18  CCA MID 101 23   46 16 HT CCA DISTAL 79 17 HT  58 8 HT ECA 178 26 CP  31 6 HT ICA PROXIMAL 77 19 CP  60 20  ICA MID 97 25 HT  79 26  ICA DISTAL 105 33     1.49 ICA / CCA Ratio (PSV) 1.03  Antegrade  Vertebral Flow  Antegrade   409 Brachial Systolic Pressure (mmHg) 811  Triphasic  Brachial Artery Waveforms Triphasic     Plaque Morphology:  HM = Homogeneous, HT = Heterogeneous, CP = Calcific Plaque, SP = Smooth Plaque, IP = Irregular Plaque     ADDITIONAL FINDINGS:     IMPRESSION: Bilateral internal carotid artery velocities suggest a <40% stenosis with history of right carotid endarterectomy.    Compared to the previous exam:  No significant change in comparison to the last exam on 03/02/2013.   ABDOMINAL AORTA DUPLEX EVALUATION    INDICATION: Evaluation of abdominal aorta.    PREVIOUS INTERVENTION(S):     DUPLEX EXAM:     LOCATION DIAMETER AP (cm) DIAMETER TRANSVERSE (cm) VELOCITIES (cm/sec)  Aorta Proximal 2.26 2.09 51  Aorta Mid 4.05 4.06 24  Aorta Distal 3.02 3.14 48  Right Common Iliac Artery 1.21 1.23 119  Left Common  Iliac Artery 0.98 1.03 109    Previous max aortic diameter:  4.00 x 4.05 Date: 03/02/2013     ADDITIONAL FINDINGS:     IMPRESSION: Patent abdominal aortic aneurysm measuring approximately 4.05 x 4.06 cm in diameter.     Compared to the previous exam:  No significant change in comparison to the last exam on 03/02/2013.      LOWER EXTREMITY ARTERIAL EVALUATION    INDICATION: Peripheral vascular disease     PREVIOUS INTERVENTION(S): Left superficial femoral artery percutaneous transluminal angioplasty and stent placement on 09/20/2010.    DUPLEX EXAM:     RIGHT  LEFT   Peak Systolic Velocity (cm/s) Ratio (if abnormal) Waveform  Peak Systolic Velocity (cm/s) Ratio (if abnormal) Waveform     Artery - Proximal to Stent 82  B      Stent - Origin 74  B     Stent - Proximal 204 2.75 B     Stent - Mid 149  B     Stent - Distal 114  B      Stent - End 35  B     Artery - Distal to Stent 41  B  0.96 Today's ABI / TBI 0.89  1.15 Previous ABI / TBI (03/02/2013  ) 1.08    Waveform:    M - Monophasic       B - Biphasic       T - Triphasic  If Ankle Brachial Index (ABI)  or Toe Brachial Index (TBI) performed, please see complete report     ADDITIONAL FINDINGS:     IMPRESSION: Elevated velocities at the left superficial femoral artery proximal stent segment suggestive of greater than 50% stenosis. Remainder of stent is patent with homogenous plaque noted throughout.    Compared to the previous exam:  Slightly decreased ABI's bilaterally.     ABI (Date: 04/15/2014)  R: 0.96 (03/02/13, 1.15), waveforms: tri and biphasic  L: 0.89 (1.08), waveforms: biphasic    ASSESSMENT:  ANAV LAMMERT is a 68 y.o. male who is status post left superficial femoral artery stent placed by Dr. Trula Slade in April 2012; the patient is also under regular surveillance for a known AAA and is status post a right CEA in December of 2006. He is asymptomatic of cerebrovascular and PAD symptoms.  His bilateral internal carotid artery stenosis remains minimal. No significant change in comparison to the last exam on 03/02/2013.  His right ABI remains stable and normal, left ABI decreased to evidence of mild arterial occlusive disease from normal, pedal pulses are palpable. Elevated velocities at the left superficial femoral artery proximal stent segment suggestive of greater than 50% stenosis. Remainder of stent is patent with homogenous plaque noted throughout.  Patent abdominal aortic aneurysm measuring approximately 4.05 x 4.06 cm in diameter.  No significant change in comparison to the last exam on 03/02/2013.   PLAN:   Based on today's exam and non-invasive vascular lab results, the patient will follow up in 1 year with the following tests AAA Duplex, ABI's, left LE arterial Duplex, and carotid Duplex. I discussed in depth with the patient the nature of atherosclerosis, and emphasized the importance of maximal medical management including strict control of blood pressure, blood glucose, and lipid levels, obtaining regular exercise, and cessation of smoking.  The patient is aware  that without maximal medical management the underlying atherosclerotic disease process will progress, limiting the benefit of any interventions. Consideration for repair of AAA would be made when the  size approaches 4.8 or 5.0 cm, growth > 1 cm/yr, and symptomatic status. The patient was given information about stroke prevention and what symptoms should prompt the patient to seek immediate medical care. The patient was given information about AAA including signs, symptoms, treatment,  what symptoms should prompt the patient to seek immediate medical care, and how to minimize the risk of enlargement and rupture of aneurysms. The patient was given information about PAD including signs, symptoms, treatment, what symptoms should prompt the patient to seek immediate medical care, and risk reduction measures to take. Thank you for allowing [place] to participate in this patient's care.  Samuel Chambers, RN, MSN, FNP-C [facility] Office: [phone]  Clinic MD: Early on call 04/15/2014 10:09 AM

## 2014-04-15 NOTE — Patient Instructions (Signed)
Stroke Prevention Some medical conditions and behaviors are associated with an increased chance of having a stroke. You may prevent a stroke by making healthy choices and managing medical conditions. HOW CAN I REDUCE MY RISK OF HAVING A STROKE?   Stay physically active. Get at least 30 minutes of activity on most or all days.  Do not smoke. It may also be helpful to avoid exposure to secondhand smoke.  Limit alcohol use. Moderate alcohol use is considered to be:  No more than 2 drinks per day for men.  No more than 1 drink per day for nonpregnant women.  Eat healthy foods. This involves:  Eating 5 or more servings of fruits and vegetables a day.  Making dietary changes that address high blood pressure (hypertension), high cholesterol, diabetes, or obesity.  Manage your cholesterol levels.  Making food choices that are high in fiber and low in saturated fat, trans fat, and cholesterol may control cholesterol levels.  Take any prescribed medicines to control cholesterol as directed by your health care provider.  Manage your diabetes.  Controlling your carbohydrate and sugar intake is recommended to manage diabetes.  Take any prescribed medicines to control diabetes as directed by your health care provider.  Control your hypertension.  Making food choices that are low in salt (sodium), saturated fat, trans fat, and cholesterol is recommended to manage hypertension.  Take any prescribed medicines to control hypertension as directed by your health care provider.  Maintain a healthy weight.  Reducing calorie intake and making food choices that are low in sodium, saturated fat, trans fat, and cholesterol are recommended to manage weight.  Stop drug abuse.  Avoid taking birth control pills.  Talk to your health care provider about the risks of taking birth control pills if you are over 35 years old, smoke, get migraines, or have ever had a blood clot.  Get evaluated for sleep  disorders (sleep apnea).  Talk to your health care provider about getting a sleep evaluation if you snore a lot or have excessive sleepiness.  Take medicines only as directed by your health care provider.  For some people, aspirin or blood thinners (anticoagulants) are helpful in reducing the risk of forming abnormal blood clots that can lead to stroke. If you have the irregular heart rhythm of atrial fibrillation, you should be on a blood thinner unless there is a good reason you cannot take them.  Understand all your medicine instructions.  Make sure that other conditions (such as anemia or atherosclerosis) are addressed. SEEK IMMEDIATE MEDICAL CARE IF:   You have sudden weakness or numbness of the face, arm, or leg, especially on one side of the body.  Your face or eyelid droops to one side.  You have sudden confusion.  You have trouble speaking (aphasia) or understanding.  You have sudden trouble seeing in one or both eyes.  You have sudden trouble walking.  You have dizziness.  You have a loss of balance or coordination.  You have a sudden, severe headache with no known cause.  You have new chest pain or an irregular heartbeat. Any of these symptoms may represent a serious problem that is an emergency. Do not wait to see if the symptoms will go away. Get medical help at once. Call your local emergency services (911 in [place] Do not drive yourself to the hospital. Document Released: 07/04/2004 Document Revised: 10/11/2013 Document Reviewed: 11/27/2012 ExitCare Patient Information 2015 [employer]. This information is not intended to replace advice given   to you by your health care provider. Make sure you discuss any questions you have with your health care provider.    Peripheral Vascular Disease Peripheral Vascular Disease (PVD), also called Peripheral Arterial Disease (PAD), is a circulation problem caused by cholesterol (atherosclerotic plaque) deposits in the  arteries. PVD commonly occurs in the lower extremities (legs) but it can occur in other areas of the body, such as your arms. The cholesterol buildup in the arteries reduces blood flow which can cause pain and other serious problems. The presence of PVD can place a person at risk for Coronary Artery Disease (CAD).  CAUSES  Causes of PVD can be many. It is usually associated with more than one risk factor such as:   High Cholesterol.  Smoking.  Diabetes.  Lack of exercise or inactivity.  High blood pressure (hypertension).  Obesity.  Family history. SYMPTOMS   When the lower extremities are affected, patients with PVD may experience:  Leg pain with exertion or physical activity. This is called INTERMITTENT CLAUDICATION. This may present as cramping or numbness with physical activity. The location of the pain is associated with the level of blockage. For example, blockage at the abdominal level (distal abdominal aorta) may result in buttock or hip pain. Lower leg arterial blockage may result in calf pain.  As PVD becomes more severe, pain can develop with less physical activity.  In people with severe PVD, leg pain may occur at rest.  Other PVD signs and symptoms:  Leg numbness or weakness.  Coldness in the affected leg or foot, especially when compared to the other leg.  A change in leg color.  Patients with significant PVD are more prone to ulcers or sores on toes, feet or legs. These may take longer to heal or may reoccur. The ulcers or sores can become infected.  If signs and symptoms of PVD are ignored, gangrene may occur. This can result in the loss of toes or loss of an entire limb.  Not all leg pain is related to PVD. Other medical conditions can cause leg pain such as:  Blood clots (embolism) or Deep Vein Thrombosis.  Inflammation of the blood vessels (vasculitis).  Spinal stenosis. DIAGNOSIS  Diagnosis of PVD can involve several different types of tests. These  can include:  Pulse Volume Recording Method (PVR). This test is simple, painless and does not involve the use of X-rays. PVR involves measuring and comparing the blood pressure in the arms and legs. An ABI (Ankle-Brachial Index) is calculated. The normal ratio of blood pressures is 1. As this number becomes smaller, it indicates more severe disease.  < 0.95 - indicates significant narrowing in one or more leg vessels.  <0.8 - there will usually be pain in the foot, leg or buttock with exercise.  <0.4 - will usually have pain in the legs at rest.  <0.25 - usually indicates limb threatening PVD.  Doppler detection of pulses in the legs. This test is painless and checks to see if you have a pulses in your legs/feet.  A dye or contrast material (a substance that highlights the blood vessels so they show up on x-ray) may be given to help your caregiver better see the arteries for the following tests. The dye is eliminated from your body by the kidney's. Your caregiver may order blood work to check your kidney function and other laboratory values before the following tests are performed:  Magnetic Resonance Angiography (MRA). An MRA is a picture study of the blood   vessels and arteries. The MRA machine uses a large magnet to produce images of the blood vessels.  Computed Tomography Angiography (CTA). A CTA is a specialized x-ray that looks at how the blood flows in your blood vessels. An IV may be inserted into your arm so contrast dye can be injected.  Angiogram. Is a procedure that uses x-rays to look at your blood vessels. This procedure is minimally invasive, meaning a small incision (cut) is made in your groin. A small tube (catheter) is then inserted into the artery of your groin. The catheter is guided to the blood vessel or artery your caregiver wants to examine. Contrast dye is injected into the catheter. X-rays are then taken of the blood vessel or artery. After the images are obtained, the  catheter is taken out. TREATMENT  Treatment of PVD involves many interventions which may include:  Lifestyle changes:  Quitting smoking.  Exercise.  Following a low fat, low cholesterol diet.  Control of diabetes.  Foot care is very important to the PVD patient. Good foot care can help prevent infection.  Medication:  Cholesterol-lowering medicine.  Blood pressure medicine.  Anti-platelet drugs.  Certain medicines may reduce symptoms of Intermittent Claudication.  Interventional/Surgical options:  Angioplasty. An Angioplasty is a procedure that inflates a balloon in the blocked artery. This opens the blocked artery to improve blood flow.  Stent Implant. A wire mesh tube (stent) is placed in the artery. The stent expands and stays in place, allowing the artery to remain open.  Peripheral Bypass Surgery. This is a surgical procedure that reroutes the blood around a blocked artery to help improve blood flow. This type of procedure may be performed if Angioplasty or stent implants are not an option. SEEK IMMEDIATE MEDICAL CARE IF:   You develop pain or numbness in your arms or legs.  Your arm or leg turns cold, becomes blue in color.  You develop redness, warmth, swelling and pain in your arms or legs. MAKE SURE YOU:   Understand these instructions.  Will watch your condition.  Will get help right away if you are not doing well or get worse. Document Released: 07/04/2004 Document Revised: 08/19/2011 Document Reviewed: 05/31/2008 ExitCare Patient Information 2015 [employer]. This information is not intended to replace advice given to you by your health care provider. Make sure you discuss any questions you have with your health care provider.    Abdominal Aortic Aneurysm An aneurysm is a weakened or damaged part of an artery wall that bulges from the normal force of blood pumping through the body. An abdominal aortic aneurysm is an aneurysm that occurs in the  lower part of the aorta, the main artery of the body.  The major concern with an abdominal aortic aneurysm is that it can enlarge and burst (rupture) or blood can flow between the layers of the wall of the aorta through a tear (aorticdissection). Both of these conditions can cause bleeding inside the body and can be life threatening unless diagnosed and treated promptly. CAUSES  The exact cause of an abdominal aortic aneurysm is unknown. Some contributing factors are:   A hardening of the arteries caused by the buildup of fat and other substances in the lining of a blood vessel (arteriosclerosis).  Inflammation of the walls of an artery (arteritis).   Connective tissue diseases, such as Marfan syndrome.   Abdominal trauma.   An infection, such as syphilis or staphylococcus, in the wall of the aorta (infectious aortitis) caused by bacteria.   RISK FACTORS  Risk factors that contribute to an abdominal aortic aneurysm may include:  Age older than 60 years.   High blood pressure (hypertension).  Male gender.  Ethnicity (white race).  Obesity.  Family history of aneurysm (first degree relatives only).  Tobacco use. PREVENTION  The following healthy lifestyle habits may help decrease your risk of abdominal aortic aneurysm:  Quitting smoking. Smoking can raise your blood pressure and cause arteriosclerosis.  Limiting or avoiding alcohol.  Keeping your blood pressure, blood sugar level, and cholesterol levels within normal limits.  Decreasing your salt intake. In somepeople, too much salt can raise blood pressure and increase your risk of abdominal aortic aneurysm.  Eating a diet low in saturated fats and cholesterol.  Increasing your fiber intake by including whole grains, vegetables, and fruits in your diet. Eating these foods may help lower blood pressure.  Maintaining a healthy weight.  Staying physically active and exercising regularly. SYMPTOMS  The symptoms of  abdominal aortic aneurysm may vary depending on the size and rate of growth of the aneurysm.Most grow slowly and do not have any symptoms. When symptoms do occur, they may include:  Pain (abdomen, side, lower back, or groin). The pain may vary in intensity. A sudden onset of severe pain may indicate that the aneurysm has ruptured.  Feeling full after eating only small amounts of food.  Nausea or vomiting or both.  Feeling a pulsating lump in the abdomen.  Feeling faint or passing out. DIAGNOSIS  Since most unruptured abdominal aortic aneurysms have no symptoms, they are often discovered during diagnostic exams for other conditions. An aneurysm may be found during the following procedures:  Ultrasonography (A one-time screening for abdominal aortic aneurysm by ultrasonography is also recommended for all men aged 65-75 years who have ever smoked).  X-ray exams.  A computed tomography (CT).  Magnetic resonance imaging (MRI).  Angiography or arteriography. TREATMENT  Treatment of an abdominal aortic aneurysm depends on the size of your aneurysm, your age, and risk factors for rupture. Medication to control blood pressure and pain may be used to manage aneurysms smaller than 6 cm. Regular monitoring for enlargement may be recommended by your caregiver if:  The aneurysm is 3-4 cm in size (an annual ultrasonography may be recommended).  The aneurysm is 4-4.5 cm in size (an ultrasonography every 6 months may be recommended).  The aneurysm is larger than 4.5 cm in size (your caregiver may ask that you be examined by a vascular surgeon). If your aneurysm is larger than 6 cm, surgical repair may be recommended. There are two main methods for repair of an aneurysm:   Endovascular repair (a minimally invasive surgery). This is done most often.  Open repair. This method is used if an endovascular repair is not possible. Document Released: 03/06/2005 Document Revised: 09/21/2012 Document  Reviewed: 06/26/2012 ExitCare Patient Information 2015 [employer]. This information is not intended to replace advice given to you by your health care provider. Make sure you discuss any questions you have with your health care provider.  

## 2014-11-13 ENCOUNTER — Ambulatory Visit (INDEPENDENT_AMBULATORY_CARE_PROVIDER_SITE_OTHER): Payer: Medicare Other | Admitting: Emergency Medicine

## 2014-11-13 VITALS — BP 152/72 | HR 74 | Temp 97.6°F | Resp 19 | Ht 68.5 in | Wt 192.0 lb

## 2014-11-13 DIAGNOSIS — L237 Allergic contact dermatitis due to plants, except food: Secondary | ICD-10-CM

## 2014-11-13 MED ORDER — TRIAMCINOLONE ACETONIDE 0.1 % EX CREA: 1.0000 "application " | TOPICAL_CREAM | Freq: Two times a day (BID) | CUTANEOUS | Status: DC

## 2014-11-13 NOTE — Progress Notes (Signed)
Subjective:  Patient ID: Samuel [name], male    DOB: 02/16/1946  Age: 69 y.o. MRN: [id number]  CC: Poison Ivy   HPI DIAGO HAIK presents  With a rash on both forearms. He said that he was working in the yard and developed a rash. Rather pruritic. He was out of town  The last several days and finally got some  Calamine lotion and Benadryl. He's had some animal symptomatic relief but he is concerned that he has pulses patient take involving food he has one half of the rash only does this.  History Dangelo has a past medical history of Hyperlipidemia; Hypertension; History of carotid stenosis; Genital warts; PAD (peripheral artery disease); Carotid artery occlusion; and AAA (abdominal aortic aneurysm).   He has past surgical history that includes Knee surgery (1962); Appendectomy (1960); Carotid endarterectomy (05/2005); and Left Leg stents (2012).   His  family history includes Peripheral vascular disease in his brother and father; Stroke in his mother.  He   reports that he has quit smoking. He has never used smokeless tobacco. He reports that he drinks alcohol. He reports that he does not use illicit drugs.  Outpatient Prescriptions Prior to Visit  Medication Sig Dispense Refill  . Cholecalciferol (VITAMIN D) 2000 UNITS CAPS Take one to two caps daily 30 capsule   . lisinopril-hydrochlorothiazide (PRINZIDE,ZESTORETIC) 10-12.5 MG per tablet Take 1 tablet by mouth daily. 90 tablet 1  . Multiple Vitamin (MULTIVITAMIN) tablet Take 1 tablet by mouth daily.    . tadalafil (CIALIS) 20 MG tablet Use 1/2 to 1 tab once daily as needed. 3 tablet 0  . predniSONE (DELTASONE) 20 MG tablet Take 2 tablets (40 mg total) by mouth daily with breakfast. (Patient not taking: Reported on 11/13/2014) 10 tablet 0   No facility-administered medications prior to visit.    History   Social History  . Marital Status: Married    Spouse Name: N/A  . Number of Children: N/A  . Years of Education: N/A    Occupational History  .      self employed - [employer]   Social History Main Topics  . Smoking status: Former Research scientist (life sciences)  . Smokeless tobacco: Never Used     Comment: quit- for porlonged periods he has smoked 2-3 packs a day  . Alcohol Use: Yes  . Drug Use: No  . Sexual Activity: Not on file   Other Topics Concern  . None   Social History Narrative   Married -to his second wife Idamae Schuller).  Has a daughter from his first marriage who is in her early 1s.     He drinks approximately 4 alcoholic beverages, beer, a day.  He has done so since age 14.  Denies any binge drinking or heavy alcohol use.  Quit tobacco 2 year ago.  He has smoked on and off x20-30 years.  For prolonged periods he has smoked 2-3 packs a day.     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  She lives in [place], [place]     Review of Systems  Constitutional: Negative for fever, chills and appetite change.  HENT: Negative for congestion, ear pain, postnasal drip, sinus pressure and sore throat.   Eyes: Negative for pain and redness.  Respiratory: Negative for cough, shortness of breath and wheezing.   Cardiovascular: Negative for leg swelling.  Gastrointestinal: Negative for nausea, vomiting, abdominal pain, diarrhea, constipation and blood in stool.  Endocrine: Negative for polyuria.  Genitourinary: Negative for dysuria, urgency, frequency and flank pain.  Musculoskeletal: Negative for gait problem.  Skin: Positive for rash.  Neurological: Negative for weakness and headaches.  Psychiatric/Behavioral: Negative for confusion and decreased concentration. The patient is not nervous/anxious.     Objective:  BP 152/72 mmHg  Pulse 74  Temp(Src) 97.6 F (36.4 C) (Oral)  Resp 19  Ht 5' 8.5" (1.74 m)  Wt 192 lb (87.091 kg)  BMI 28.77 kg/m2  SpO2 98%  Physical Exam  Constitutional: He is oriented to person, place, and time. He appears well-developed and  well-nourished. No distress.  HENT:  Head: Normocephalic and atraumatic.  Right Ear: External ear normal.  Left Ear: External ear normal.  Nose: Nose normal.  Eyes: Conjunctivae and EOM are normal. Pupils are equal, round, and reactive to light. No scleral icterus.  Neck: Normal range of motion. Neck supple. No tracheal deviation present.  Cardiovascular: Normal rate, regular rhythm and normal heart sounds.   Pulmonary/Chest: Effort normal. No respiratory distress. He has no wheezes. He has no rales.  Abdominal: He exhibits no mass. There is no tenderness. There is no rebound and no guarding.  Musculoskeletal: He exhibits no edema.  Lymphadenopathy:    He has no cervical adenopathy.  Neurological: He is alert and oriented to person, place, and time. Coordination normal.  Skin: Skin is warm and dry. Rash noted.  Psychiatric: He has a normal mood and affect. His behavior is normal.      Assessment & Plan:   Timmy was seen today for poison ivy.  Diagnoses and all orders for this visit:  Poison ivy  Other orders -     triamcinolone cream (KENALOG) 0.1 %; Apply 1 application topically 2 (two) times daily.   I am having Mr. Dery start on triamcinolone cream. I am also having him maintain his multivitamin, Vitamin D, predniSONE, lisinopril-hydrochlorothiazide, and tadalafil.  Meds ordered this encounter  Medications  . triamcinolone cream (KENALOG) 0.1 %    Sig: Apply 1 application topically 2 (two) times daily.    Dispense:  30 g    Refill:  0    Appropriate red flag conditions were discussed with the patient as well as actions that should be taken.  Patient expressed his understanding.  Follow-up: Return if symptoms worsen or fail to improve.  Roselee Culver, MD

## 2014-11-13 NOTE — Patient Instructions (Signed)

## 2014-12-02 ENCOUNTER — Other Ambulatory Visit: Payer: Self-pay | Admitting: Internal Medicine

## 2014-12-16 ENCOUNTER — Ambulatory Visit (INDEPENDENT_AMBULATORY_CARE_PROVIDER_SITE_OTHER): Payer: Medicare Other | Admitting: Internal Medicine

## 2014-12-16 ENCOUNTER — Encounter: Payer: Self-pay | Admitting: Internal Medicine

## 2014-12-16 VITALS — BP 112/62 | HR 71 | Temp 98.5°F | Ht 68.5 in | Wt 187.0 lb

## 2014-12-16 DIAGNOSIS — E785 Hyperlipidemia, unspecified: Secondary | ICD-10-CM

## 2014-12-16 DIAGNOSIS — I10 Essential (primary) hypertension: Secondary | ICD-10-CM | POA: Diagnosis not present

## 2014-12-16 DIAGNOSIS — I739 Peripheral vascular disease, unspecified: Secondary | ICD-10-CM | POA: Diagnosis not present

## 2014-12-16 DIAGNOSIS — I6529 Occlusion and stenosis of unspecified carotid artery: Secondary | ICD-10-CM

## 2014-12-16 MED ORDER — LISINOPRIL-HYDROCHLOROTHIAZIDE 10-12.5 MG PO TABS: 1.0000 | ORAL_TABLET | Freq: Every day | ORAL | Status: DC

## 2014-12-16 NOTE — Assessment & Plan Note (Signed)
Followed by vascular specialist 

## 2014-12-16 NOTE — Assessment & Plan Note (Signed)
BP well controlled.  Continue Lisinopril - Hctz.

## 2014-12-16 NOTE — Progress Notes (Signed)
Subjective:    Patient ID: Samuel [name], male    DOB: 07-15-1945, 69 y.o.   MRN: [id number]  HPI  69 year old white male with history of peripheral vascular disease, carotid stenosis and abdominal aortic aneurysm for routine follow-up. Unfortunately patient is statin intolerant. He has documented severe muscle pain after taking Lipitor. This was noted on 04/21/2013. He was then switched to Livalo and experienced similar reaction on 09/01/2013.  Patient also recalls similar reaction to taking Crestor in the past.  He is adamant about not trying another statin.  Interval medical history-he had episode of poison ivy that was treated with prednisone in June 2016 - resolved.  Hypertension-stable   Review of Systems Negative for chest pain, negative for shortness of breath    Past Medical History  Diagnosis Date  . Hyperlipidemia   . Hypertension   . History of carotid stenosis     s/p right carotid endarterectomy 12/06  . Genital warts     history of  . PAD (peripheral artery disease)     Left leg  . Carotid artery occlusion   . AAA (abdominal aortic aneurysm)     History   Social History  . Marital Status: Married    Spouse Name: N/A  . Number of Children: N/A  . Years of Education: N/A   Occupational History  .      self employed - [employer]   Social History Main Topics  . Smoking status: Former Research scientist (life sciences)  . Smokeless tobacco: Never Used     Comment: quit- for porlonged periods he has smoked 2-3 packs a day  . Alcohol Use: Yes  . Drug Use: No  . Sexual Activity: Not on file   Other Topics Concern  . Not on file   Social History Narrative   Married -to his second wife Idamae Schuller).  Has a daughter from his first marriage who is in her early 56s.     He drinks approximately 4 alcoholic beverages, beer, a day.  He has done so since age 71.  Denies any binge drinking or heavy alcohol use.  Quit tobacco 2 year ago.  He has smoked on and off x20-30 years.  For prolonged  periods he has smoked 2-3 packs a day.     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  She lives in [place], [place]    Past Surgical History  Procedure Laterality Date  . Knee surgery  1962  . Appendectomy  1960  . Carotid endarterectomy  05/2005    right  . Left leg stents  2012    Family History  Problem Relation Age of Onset  . Stroke Mother     deceased age 58 had her firsrt CVA age  73  . Peripheral vascular disease Brother   . Peripheral vascular disease Father     status post leg amputation  secondary to PVD    Allergies  Allergen Reactions  . Lipitor [Atorvastatin] Other (See Comments)    Muscle pain  . Livalo [Pitavastatin] Other (See Comments)    Muscle aches  . Statins     Current Outpatient Prescriptions on File Prior to Visit  Medication Sig Dispense Refill  . Cholecalciferol (VITAMIN D) 2000 UNITS CAPS Take one to two caps daily 30 capsule   . lisinopril-hydrochlorothiazide (PRINZIDE,ZESTORETIC) 10-12.5 MG per tablet TAKE 1 TABLET BY MOUTH DAILY. 30 tablet 0  . Multiple Vitamin (MULTIVITAMIN) tablet Take  1 tablet by mouth daily.    . predniSONE (DELTASONE) 20 MG tablet Take 2 tablets (40 mg total) by mouth daily with breakfast. 10 tablet 0   No current facility-administered medications on file prior to visit.    BP 112/62 mmHg  Pulse 71  Temp(Src) 98.5 F (36.9 C) (Oral)  Ht 5' 8.5" (1.74 m)  Wt 187 lb (84.823 kg)  BMI 28.02 kg/m2    Objective:   Physical Exam  Constitutional: He is oriented to person, place, and time. He appears well-developed and well-nourished. No distress.  HENT:  Head: Normocephalic and atraumatic.  Neck:  No carotid bruit  Cardiovascular: Normal rate, regular rhythm and normal heart sounds.   No murmur heard. Pulmonary/Chest: Effort normal and breath sounds normal. He has no wheezes.  Neurological: He is alert and oriented to person, place, and time. No cranial nerve  deficit.  Psychiatric: He has a normal mood and affect. His behavior is normal.          Assessment & Plan:

## 2014-12-16 NOTE — Assessment & Plan Note (Signed)
Patient tried and failed at least 2 statins.  He experiences severe myalgias on statin therapy.  Refer to lipid clinic.  He may be candidate for PCSK9 therapy especially considering history of significant PVD.

## 2014-12-16 NOTE — Patient Instructions (Signed)
Please schedule next office visit at medicare wellness exam

## 2014-12-16 NOTE — Progress Notes (Signed)
Pre visit review using our clinic review tool, if applicable. No additional management support is needed unless otherwise documented below in the visit note. 

## 2014-12-22 ENCOUNTER — Ambulatory Visit (INDEPENDENT_AMBULATORY_CARE_PROVIDER_SITE_OTHER): Payer: Medicare Other | Admitting: Pharmacist

## 2014-12-22 DIAGNOSIS — E785 Hyperlipidemia, unspecified: Secondary | ICD-10-CM

## 2014-12-22 MED ORDER — ROSUVASTATIN CALCIUM 5 MG PO TABS: 5.0000 mg | ORAL_TABLET | Freq: Every day | ORAL | Status: DC

## 2014-12-22 NOTE — Progress Notes (Signed)
HPI  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  He has a PMH significant for PVD s/p left superficial femoral artery stent in 2012, carotid artery stenosis s/p right CEA in 2006, AAA, and HTN.  He states he has tried several statins and experienced myalgias- to the point he could not pick up a gallon of milk.  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    Family history: Mother: CVA in 23s, father: PVD   Diet: "eat what I want to".  Breakfast: Cereal, egg, bacon, milk.  Lunch and Dinner: wide variety.  He travels a lot with his work so never knows where he may be at mealtime.  No snacks.  He drinks mostly water during the day and beer at night (pt would not admit to the amount he drinks)  Exercise- No routine exercise outside of work  RF: PVD, carotid stenosis, AAA, HTN, family history Goals: LDL <70 mg/dL, non-HDL < 100 mg/dL Current Therapy: none Intolerances: Lipitor 80mg  daily, Livalo 2mg  and 4mg , Crestor- in 2006- unknown dose  Labs:  08/2013: TC 217, TG 220, HDL 42, LDL 131  Current Outpatient Prescriptions  Medication Sig Dispense Refill  . Cholecalciferol (VITAMIN D) 2000 UNITS CAPS Take one to two caps daily 30 capsule   . CRESTOR 5 MG tablet TAKE 1 TABLET (5 MG TOTAL) BY MOUTH DAILY. OR AS DIRECTED 30 tablet 3  . lisinopril-hydrochlorothiazide (PRINZIDE,ZESTORETIC) 10-12.5 MG per tablet Take 1 tablet by mouth daily. 90 tablet 1  . lisinopril-hydrochlorothiazide (PRINZIDE,ZESTORETIC) 10-12.5 MG per tablet TAKE 1 TABLET BY MOUTH DAILY. 30 tablet 5  . Multiple Vitamin (MULTIVITAMIN) tablet Take 1 tablet by mouth daily.    . sildenafil (REVATIO) 20 MG tablet Take 10 mg by mouth daily as needed.     No current facility-administered medications for this visit.   Allergies  Allergen Reactions  . Lipitor [Atorvastatin] Other (See Comments)    Muscle pain  . Livalo [Pitavastatin] Other (See Comments)    Muscle aches  . Statins     Assessment and Plan  1.  Hyperlipidemia- Pt's failed high dose Lipitor and unfortunately cannot remember the dose of Crestor.  ? If myalgias are related to dose versus statin therapy itself.  Pt is willing to retry Crestor at a low dose.  Will start with 5mg  two days of the week.  If he is able to tolerate and still above LDL goal of < 70, will need to try to add Zetia to Crestor before pursing PCSK-9.  Plan to recheck labs with his yearly physical in November if he is able to tolerate Crestor.

## 2014-12-22 NOTE — Patient Instructions (Signed)
Start taking Crestor 5mg  on Monday and Friday each week.  If you have any problems, please call Gay Filler at [phone].   If you are able to tolerate this, we will recheck your labs in 3 months.

## 2015-01-03 ENCOUNTER — Other Ambulatory Visit: Payer: Self-pay | Admitting: Internal Medicine

## 2015-01-16 ENCOUNTER — Other Ambulatory Visit: Payer: Self-pay | Admitting: Internal Medicine

## 2015-04-19 ENCOUNTER — Encounter: Payer: Medicare Other | Admitting: Internal Medicine

## 2015-05-02 ENCOUNTER — Encounter (HOSPITAL_COMMUNITY): Payer: Medicare Other

## 2015-05-02 ENCOUNTER — Other Ambulatory Visit (HOSPITAL_COMMUNITY): Payer: Self-pay

## 2015-05-02 ENCOUNTER — Encounter: Payer: Self-pay | Admitting: Family Medicine

## 2015-05-02 ENCOUNTER — Ambulatory Visit: Payer: Medicare Other | Admitting: Family

## 2015-05-02 ENCOUNTER — Ambulatory Visit (INDEPENDENT_AMBULATORY_CARE_PROVIDER_SITE_OTHER): Payer: Medicare Other | Admitting: Family Medicine

## 2015-05-02 VITALS — BP 110/80 | HR 78 | Temp 98.2°F | Resp 16 | Ht 68.0 in | Wt 190.5 lb

## 2015-05-02 DIAGNOSIS — Z0000 Encounter for general adult medical examination without abnormal findings: Secondary | ICD-10-CM

## 2015-05-02 NOTE — Progress Notes (Signed)
Pre visit review using our clinic review tool, if applicable. No additional management support is needed unless otherwise documented below in the visit note. 

## 2015-05-02 NOTE — Progress Notes (Signed)
Subjective:    Patient ID: Samuel [name], male    DOB: 19-Nov-1945, 69 y.o.   MRN: [id number]  HPI  Patient here for physical exam. Chronic problems include history of peripheral vascular disease, dyslipidemia, hypertension, reported borderline type 2 diabetes. He is followed by [facility]. He is due for repeat colonoscopy this year. He declines several recommended vaccinations including flu, shingles, and pneumonia vaccinations. His tetanus is up-to-date. He is followed regularly by vascular surgery's had previous carotid endarterectomy as well as peripheral arterial stents.  Also has the abdominal aortic aneurysm followed by vascular surgery Former smoker.  No hx of CAD.  No recent chest pain.  Past Medical History  Diagnosis Date  . Hyperlipidemia   . Hypertension   . History of carotid stenosis     s/p right carotid endarterectomy 12/06  . Genital warts     history of  . PAD (peripheral artery disease) (HCC)     Left leg  . Carotid artery occlusion   . AAA (abdominal aortic aneurysm) [facility])    Past Surgical History  Procedure Laterality Date  . Knee surgery  1962  . Appendectomy  1960  . Carotid endarterectomy  05/2005    right  . Left leg stents  2012    reports that he has quit smoking. He has never used smokeless tobacco. He reports that he drinks alcohol. He reports that he does not use illicit drugs. family history includes Peripheral vascular disease in his brother and father; Stroke in his mother. Allergies  Allergen Reactions  . Lipitor [Atorvastatin] Other (See Comments)    Muscle pain  . Livalo [Pitavastatin] Other (See Comments)    Muscle aches  . Statins       Review of Systems  Constitutional: Negative for fever, activity change, appetite change, fatigue and unexpected weight change.  HENT: Negative for congestion, ear pain and trouble swallowing.   Eyes: Negative for pain and visual disturbance.  Respiratory: Negative for cough, shortness of  breath and wheezing.   Cardiovascular: Negative for chest pain and palpitations.  Gastrointestinal: Negative for nausea, vomiting, abdominal pain, diarrhea, constipation, blood in stool, abdominal distention and rectal pain.  Endocrine: Negative for polydipsia and polyuria.  Genitourinary: Negative for dysuria, hematuria and testicular pain.  Musculoskeletal: Negative for joint swelling and arthralgias.  Skin: Negative for rash.  Neurological: Negative for dizziness, syncope and headaches.  Hematological: Negative for adenopathy.  Psychiatric/Behavioral: Negative for confusion and dysphoric mood.       Objective:   Physical Exam  Constitutional: He is oriented to person, place, and time. He appears well-developed and well-nourished. No distress.  HENT:  Head: Normocephalic and atraumatic.  Right Ear: External ear normal.  Left Ear: External ear normal.  Mouth/Throat: Oropharynx is clear and moist.  Eyes: Conjunctivae and EOM are normal. Pupils are equal, round, and reactive to light.  Neck: Normal range of motion. Neck supple. No thyromegaly present.  Cardiovascular: Normal rate, regular rhythm and normal heart sounds.   No murmur heard. Pulmonary/Chest: No respiratory distress. He has no wheezes. He has no rales.  Abdominal: Soft. Bowel sounds are normal. He exhibits no distension and no mass. There is no tenderness. There is no rebound and no guarding.  Genitourinary: Rectum normal and prostate normal.  Musculoskeletal: He exhibits no edema.  Lymphadenopathy:    He has no cervical adenopathy.  Neurological: He is alert and oriented to person, place, and time. He displays normal reflexes. No cranial nerve deficit.  Skin: No rash noted.  Psychiatric: He has a normal mood and affect.          Assessment & Plan:  Physical exam. Obtain screening labs. Schedule repeat colonoscopy. Continue close follow-up with vein and vascular surgery group.  Consider more aggressive management  of lipids-depending on lab results (though he has apparently been intolerant of multiple statins in the past)  The natural history of prostate cancer and ongoing controversy regarding screening and potential treatment outcomes of prostate cancer has been discussed with the patient. The meaning of a false positive PSA and a false negative PSA has been discussed. He indicates understanding of the limitations of this screening test and wishes  to proceed with screening PSA testing.

## 2015-05-02 NOTE — Patient Instructions (Signed)
We will set up repeat colonoscopy.   Check on coverage for shingles vaccine

## 2015-05-16 ENCOUNTER — Encounter: Payer: Self-pay | Admitting: Gastroenterology

## 2015-06-16 ENCOUNTER — Telehealth: Payer: Self-pay | Admitting: Family Medicine

## 2015-06-16 ENCOUNTER — Encounter: Payer: Self-pay | Admitting: Family

## 2015-06-16 ENCOUNTER — Ambulatory Visit (INDEPENDENT_AMBULATORY_CARE_PROVIDER_SITE_OTHER): Payer: Medicare Other | Admitting: Family Medicine

## 2015-06-16 ENCOUNTER — Encounter: Payer: Self-pay | Admitting: Family Medicine

## 2015-06-16 VITALS — BP 140/80 | HR 77 | Temp 97.6°F | Ht 68.0 in | Wt 196.0 lb

## 2015-06-16 DIAGNOSIS — M2550 Pain in unspecified joint: Secondary | ICD-10-CM | POA: Diagnosis not present

## 2015-06-16 LAB — CBC WITH DIFFERENTIAL/PLATELET
Basophils Absolute: 0 10*3/uL (ref 0.0–0.1)
Basophils Relative: 0.3 % (ref 0.0–3.0)
Eosinophils Absolute: 0.4 10*3/uL (ref 0.0–0.7)
Eosinophils Relative: 5.8 % — ABNORMAL HIGH (ref 0.0–5.0)
HCT: 40.5 % (ref 39.0–52.0)
Hemoglobin: 13.7 g/dL (ref 13.0–17.0)
LYMPHS ABS: 2.2 10*3/uL (ref 0.7–4.0)
Lymphocytes Relative: 28.4 % (ref 12.0–46.0)
MCHC: 33.9 g/dL (ref 30.0–36.0)
MCV: 96.4 fl (ref 78.0–100.0)
MONO ABS: 0.9 10*3/uL (ref 0.1–1.0)
Monocytes Relative: 11 % (ref 3.0–12.0)
Neutro Abs: 4.2 10*3/uL (ref 1.4–7.7)
Neutrophils Relative %: 54.5 % (ref 43.0–77.0)
PLATELETS: 209 10*3/uL (ref 150.0–400.0)
RBC: 4.2 Mil/uL — AB (ref 4.22–5.81)
RDW: 13 % (ref 11.5–15.5)
WBC: 7.8 10*3/uL (ref 4.0–10.5)

## 2015-06-16 LAB — SEDIMENTATION RATE: SED RATE: 32 mm/h — AB (ref 0–22)

## 2015-06-16 LAB — RHEUMATOID FACTOR: Rhuematoid fact SerPl-aCnc: <10 IU/mL (ref <=14)

## 2015-06-16 NOTE — Progress Notes (Signed)
   Subjective:    Patient ID: Samuel [name], male    DOB: 04-04-1946, 70 y.o.   MRN: [id number]  HPI Patient seen as a work in with polyarthralgias Progressive over the past couple months -at least. He has joint pain in several joints including hands-mostly thumbs, shoulders bilaterally, and hips bilaterally.  He's never seen any obvious signs of inflammation such as redness or warmth or visible swelling. He does have history of gout. This pain is different. He denies any tick bites. No rashes. No fevers or chills. No appetite or weight changes. Denies myalgias.  He took some type of over-the-counter medication with some relief. Also had leftover indomethacin from previous gout and has taken at a couple times which helped. No family history of rheumatoid arthritis.  Patient has chronic problems include peripheral vascular disease, hypertension, hyperlipidemia, borderline type 2 diabetes, gout Currently not taking a statin   Past Medical History  Diagnosis Date  . Hyperlipidemia   . Hypertension   . History of carotid stenosis     s/p right carotid endarterectomy 12/06  . Genital warts     history of  . PAD (peripheral artery disease) (HCC)     Left leg  . Carotid artery occlusion   . AAA (abdominal aortic aneurysm) [facility])    Past Surgical History  Procedure Laterality Date  . Knee surgery  1962  . Appendectomy  1960  . Carotid endarterectomy  05/2005    right  . Left leg stents  2012    reports that he has quit smoking. He has never used smokeless tobacco. He reports that he drinks alcohol. He reports that he does not use illicit drugs. family history includes Peripheral vascular disease in his brother and father; Stroke in his mother. Allergies  Allergen Reactions  . Lipitor [Atorvastatin] Other (See Comments)    Muscle pain  . Livalo [Pitavastatin] Other (See Comments)    Muscle aches  . Statins         Review of Systems  Constitutional: Negative for fatigue.    Eyes: Negative for visual disturbance.  Respiratory: Negative for cough, chest tightness and shortness of breath.   Cardiovascular: Negative for chest pain, palpitations and leg swelling.  Genitourinary: Negative for dysuria.  Musculoskeletal: Positive for arthralgias. Negative for myalgias and back pain.  Skin: Negative for rash.  Neurological: Negative for dizziness, syncope, weakness, light-headedness and headaches.       Objective:   Physical Exam  Constitutional: He appears well-developed and well-nourished.  Cardiovascular: Normal rate and regular rhythm.   Pulmonary/Chest: Effort normal and breath sounds normal. No respiratory distress. He has no wheezes. He has no rales.  Musculoskeletal:  No signs of joint inflammation such as erythema, warmth, or visible swelling.  Skin: No rash noted.          Assessment & Plan:   Polyarthralgias. He is describing relatively symmetric arthralgias involving multiple joints. No exam findings of inflammation.  Obtain screening lab work-rheumatoid factor, sedimentation rate, CBC, ANA, Lyme antibodies he'll try Tylenol as needed for any recurrent symptoms.

## 2015-06-16 NOTE — Progress Notes (Signed)
Pre visit review using our clinic review tool, if applicable. No additional management support is needed unless otherwise documented below in the visit note. 

## 2015-06-16 NOTE — Telephone Encounter (Signed)
Pt would like to change preferred pharmacy to [facility] on [address] in [place]

## 2015-06-16 NOTE — Patient Instructions (Signed)

## 2015-06-16 NOTE — Telephone Encounter (Signed)
Pharmacy updated.

## 2015-06-19 LAB — LYME AB/WESTERN BLOT REFLEX: B BURGDORFERI AB IGG+ IGM: 0.38 {ISR}

## 2015-06-19 LAB — ANA: Anti Nuclear Antibody(ANA): NEGATIVE

## 2015-06-23 ENCOUNTER — Ambulatory Visit (INDEPENDENT_AMBULATORY_CARE_PROVIDER_SITE_OTHER)
Admission: RE | Admit: 2015-06-23 | Discharge: 2015-06-23 | Disposition: A | Discharge status: Home / Self Care | Payer: Medicare Other | Source: Ambulatory Visit | Attending: Family | Admitting: Family

## 2015-06-23 ENCOUNTER — Ambulatory Visit (INDEPENDENT_AMBULATORY_CARE_PROVIDER_SITE_OTHER): Payer: Medicare Other | Admitting: Family

## 2015-06-23 ENCOUNTER — Ambulatory Visit (HOSPITAL_COMMUNITY)
Admission: RE | Admit: 2015-06-23 | Discharge: 2015-06-23 | Disposition: A | Discharge status: Home / Self Care | Payer: Medicare Other | Source: Ambulatory Visit | Attending: Family | Admitting: Family

## 2015-06-23 ENCOUNTER — Other Ambulatory Visit: Payer: Self-pay | Admitting: Family

## 2015-06-23 ENCOUNTER — Encounter: Payer: Self-pay | Admitting: Family

## 2015-06-23 VITALS — BP 164/82 | HR 75 | Ht 68.0 in | Wt 195.5 lb

## 2015-06-23 DIAGNOSIS — I739 Peripheral vascular disease, unspecified: Secondary | ICD-10-CM | POA: Insufficient documentation

## 2015-06-23 DIAGNOSIS — I714 Abdominal aortic aneurysm, without rupture, unspecified: Secondary | ICD-10-CM

## 2015-06-23 DIAGNOSIS — Z87891 Personal history of nicotine dependence: Secondary | ICD-10-CM | POA: Insufficient documentation

## 2015-06-23 DIAGNOSIS — I6529 Occlusion and stenosis of unspecified carotid artery: Secondary | ICD-10-CM

## 2015-06-23 DIAGNOSIS — E785 Hyperlipidemia, unspecified: Secondary | ICD-10-CM | POA: Diagnosis not present

## 2015-06-23 DIAGNOSIS — I6523 Occlusion and stenosis of bilateral carotid arteries: Secondary | ICD-10-CM | POA: Insufficient documentation

## 2015-06-23 DIAGNOSIS — Z95820 Peripheral vascular angioplasty status with implants and grafts: Secondary | ICD-10-CM | POA: Diagnosis not present

## 2015-06-23 DIAGNOSIS — I10 Essential (primary) hypertension: Secondary | ICD-10-CM | POA: Insufficient documentation

## 2015-06-23 DIAGNOSIS — I779 Disorder of arteries and arterioles, unspecified: Secondary | ICD-10-CM | POA: Diagnosis not present

## 2015-06-23 NOTE — Patient Instructions (Signed)
Stroke Prevention Some medical conditions and behaviors are associated with an increased chance of having a stroke. You may prevent a stroke by making healthy choices and managing medical conditions. HOW CAN I REDUCE MY RISK OF HAVING A STROKE?   Stay physically active. Get at least 30 minutes of activity on most or all days.  Do not smoke. It may also be helpful to avoid exposure to secondhand smoke.  Limit alcohol use. Moderate alcohol use is considered to be:  No more than 2 drinks per day for men.  No more than 1 drink per day for nonpregnant women.  Eat healthy foods. This involves:  Eating 5 or more servings of fruits and vegetables a day.  Making dietary changes that address high blood pressure (hypertension), high cholesterol, diabetes, or obesity.  Manage your cholesterol levels.  Making food choices that are high in fiber and low in saturated fat, trans fat, and cholesterol may control cholesterol levels.  Take any prescribed medicines to control cholesterol as directed by your health care provider.  Manage your diabetes.  Controlling your carbohydrate and sugar intake is recommended to manage diabetes.  Take any prescribed medicines to control diabetes as directed by your health care provider.  Control your hypertension.  Making food choices that are low in salt (sodium), saturated fat, trans fat, and cholesterol is recommended to manage hypertension.  Ask your health care provider if you need treatment to lower your blood pressure. Take any prescribed medicines to control hypertension as directed by your health care provider.  If you are 18-39 years of age, have your blood pressure checked every 3-5 years. If you are 40 years of age or older, have your blood pressure checked every year.  Maintain a healthy weight.  Reducing calorie intake and making food choices that are low in sodium, saturated fat, trans fat, and cholesterol are recommended to manage  weight.  Stop drug abuse.  Avoid taking birth control pills.  Talk to your health care provider about the risks of taking birth control pills if you are over 35 years old, smoke, get migraines, or have ever had a blood clot.  Get evaluated for sleep disorders (sleep apnea).  Talk to your health care provider about getting a sleep evaluation if you snore a lot or have excessive sleepiness.  Take medicines only as directed by your health care provider.  For some people, aspirin or blood thinners (anticoagulants) are helpful in reducing the risk of forming abnormal blood clots that can lead to stroke. If you have the irregular heart rhythm of atrial fibrillation, you should be on a blood thinner unless there is a good reason you cannot take them.  Understand all your medicine instructions.  Make sure that other conditions (such as anemia or atherosclerosis) are addressed. SEEK IMMEDIATE MEDICAL CARE IF:   You have sudden weakness or numbness of the face, arm, or leg, especially on one side of the body.  Your face or eyelid droops to one side.  You have sudden confusion.  You have trouble speaking (aphasia) or understanding.  You have sudden trouble seeing in one or both eyes.  You have sudden trouble walking.  You have dizziness.  You have a loss of balance or coordination.  You have a sudden, severe headache with no known cause.  You have new chest pain or an irregular heartbeat. Any of these symptoms may represent a serious problem that is an emergency. Do not wait to see if the symptoms will   go away. Get medical help at once. Call your local emergency services (911 in [place] Do not drive yourself to the hospital.   This information is not intended to replace advice given to you by your health care provider. Make sure you discuss any questions you have with your health care provider.   Document Released: 07/04/2004 Document Revised: 06/17/2014 Document Reviewed:  11/27/2012 Elsevier Interactive Patient Education 2016 Elsevier Inc[employer]    Abdominal Aortic Aneurysm An aneurysm is a weakened or damaged part of an artery wall that bulges from the normal force of blood pumping through the body. An abdominal aortic aneurysm is an aneurysm that occurs in the lower part of the aorta, the main artery of the body.  The major concern with an abdominal aortic aneurysm is that it can enlarge and burst (rupture) or blood can flow between the layers of the wall of the aorta through a tear (aorticdissection). Both of these conditions can cause bleeding inside the body and can be life threatening unless diagnosed and treated promptly. CAUSES  The exact cause of an abdominal aortic aneurysm is unknown. Some contributing factors are:   A hardening of the arteries caused by the buildup of fat and other substances in the lining of a blood vessel (arteriosclerosis).  Inflammation of the walls of an artery (arteritis).   Connective tissue diseases, such as Marfan syndrome.   Abdominal trauma.   An infection, such as syphilis or staphylococcus, in the wall of the aorta (infectious aortitis) caused by bacteria. RISK FACTORS  Risk factors that contribute to an abdominal aortic aneurysm may include:  Age older than 60 years.   High blood pressure (hypertension).  Male gender.  Ethnicity (white race).  Obesity.  Family history of aneurysm (first degree relatives only).  Tobacco use. PREVENTION  The following healthy lifestyle habits may help decrease your risk of abdominal aortic aneurysm:  Quitting smoking. Smoking can raise your blood pressure and cause arteriosclerosis.  Limiting or avoiding alcohol.  Keeping your blood pressure, blood sugar level, and cholesterol levels within normal limits.  Decreasing your salt intake. In somepeople, too much salt can raise blood pressure and increase your risk of abdominal aortic aneurysm.  Eating a diet low in  saturated fats and cholesterol.  Increasing your fiber intake by including whole grains, vegetables, and fruits in your diet. Eating these foods may help lower blood pressure.  Maintaining a healthy weight.  Staying physically active and exercising regularly. SYMPTOMS  The symptoms of abdominal aortic aneurysm may vary depending on the size and rate of growth of the aneurysm.Most grow slowly and do not have any symptoms. When symptoms do occur, they may include:  Pain (abdomen, side, lower back, or groin). The pain may vary in intensity. A sudden onset of severe pain may indicate that the aneurysm has ruptured.  Feeling full after eating only small amounts of food.  Nausea or vomiting or both.  Feeling a pulsating lump in the abdomen.  Feeling faint or passing out. DIAGNOSIS  Since most unruptured abdominal aortic aneurysms have no symptoms, they are often discovered during diagnostic exams for other conditions. An aneurysm may be found during the following procedures:  Ultrasonography (A one-time screening for abdominal aortic aneurysm by ultrasonography is also recommended for all men aged 65-75 years who have ever smoked).  X-ray exams.  A computed tomography (CT).  Magnetic resonance imaging (MRI).  Angiography or arteriography. TREATMENT  Treatment of an abdominal aortic aneurysm depends on the size of your   aneurysm, your age, and risk factors for rupture. Medication to control blood pressure and pain may be used to manage aneurysms smaller than 6 cm. Regular monitoring for enlargement may be recommended by your caregiver if:  The aneurysm is 3-4 cm in size (an annual ultrasonography may be recommended).  The aneurysm is 4-4.5 cm in size (an ultrasonography every 6 months may be recommended).  The aneurysm is larger than 4.5 cm in size (your caregiver may ask that you be examined by a vascular surgeon). If your aneurysm is larger than 6 cm, surgical repair may be  recommended. There are two main methods for repair of an aneurysm:   Endovascular repair (a minimally invasive surgery). This is done most often.  Open repair. This method is used if an endovascular repair is not possible.   This information is not intended to replace advice given to you by your health care provider. Make sure you discuss any questions you have with your health care provider.   Document Released: 03/06/2005 Document Revised: 09/21/2012 Document Reviewed: 06/26/2012 Elsevier Interactive Patient Education 2016 Elsevier Inc.    Peripheral Vascular Disease Peripheral vascular disease (PVD) is a disease of the blood vessels that are not part of your heart and brain. A simple term for PVD is poor circulation. In most cases, PVD narrows the blood vessels that carry blood from your heart to the rest of your body. This can result in a decreased supply of blood to your arms, legs, and internal organs, like your stomach or kidneys. However, it most often affects a person's lower legs and feet. There are two types of PVD.  Organic PVD. This is the more common type. It is caused by damage to the structure of blood vessels.  Functional PVD. This is caused by conditions that make blood vessels contract and tighten (spasm). Without treatment, PVD tends to get worse over time. PVD can also lead to acute ischemic limb. This is when an arm or limb suddenly has trouble getting enough blood. This is a medical emergency. CAUSES Each type of PVD has many different causes. The most common cause of PVD is buildup of a fatty material (plaque) inside of your arteries (atherosclerosis). Small amounts of plaque can break off from the walls of the blood vessels and become lodged in a smaller artery. This blocks blood flow and can cause acute ischemic limb. Other common causes of PVD include:  Blood clots that form inside of blood vessels.  Injuries to blood vessels.  Diseases that cause  inflammation of blood vessels or cause blood vessel spasms.  Health behaviors and health history that increase your risk of developing PVD. RISK FACTORS  You may have a greater risk of PVD if you:  Have a family history of PVD.  Have certain medical conditions, including:  High cholesterol.  Diabetes.  High blood pressure (hypertension).  Coronary heart disease.  Past problems with blood clots.  Past injury, such as burns or a broken bone. These may have damaged blood vessels in your limbs.  Buerger disease. This is caused by inflamed blood vessels in your hands and feet.  Some forms of arthritis.  Rare birth defects that affect the arteries in your legs.  Use tobacco.  Do not get enough exercise.  Are obese.  Are age 50 or older. SIGNS AND SYMPTOMS  PVD may cause many different symptoms. Your symptoms depend on what part of your body is not getting enough blood. Some common signs and symptoms include:    Cramps in your lower legs. This may be a symptom of poor leg circulation (claudication).  Pain and weakness in your legs while you are physically active that goes away when you rest (intermittent claudication).  Leg pain when at rest.  Leg numbness, tingling, or weakness.  Coldness in a leg or foot, especially when compared with the other leg.  Skin or hair changes. These can include:  Hair loss.  Shiny skin.  Pale or bluish skin.  Thick toenails.  Inability to get or maintain an erection (erectile dysfunction). People with PVD are more prone to developing ulcers and sores on their toes, feet, or legs. These may take longer than normal to heal. DIAGNOSIS Your health care provider may diagnose PVD from your signs and symptoms. The health care provider will also do a physical exam. You may have tests to find out what is causing your PVD and determine its severity. Tests may include:  Blood pressure recordings from your arms and legs and measurements of the  strength of your pulses (pulse volume recordings).  Imaging studies using sound waves to take pictures of the blood flow through your blood vessels (Doppler ultrasound).  Injecting a dye into your blood vessels before having imaging studies using:  X-rays (angiogram or arteriogram).  Computer-generated X-rays (CT angiogram).  A powerful electromagnetic field and a computer (magnetic resonance angiogram or MRA). TREATMENT Treatment for PVD depends on the cause of your condition and the severity of your symptoms. It also depends on your age. Underlying causes need to be treated and controlled. These include long-lasting (chronic) conditions, such as diabetes, high cholesterol, and high blood pressure. You may need to first try making lifestyle changes and taking medicines. Surgery may be needed if these do not work. Lifestyle changes may include:  Quitting smoking.  Exercising regularly.  Following a low-fat, low-cholesterol diet. Medicines may include:  Blood thinners to prevent blood clots.  Medicines to improve blood flow.  Medicines to improve your blood cholesterol levels. Surgical procedures may include:  A procedure that uses an inflated balloon to open a blocked artery and improve blood flow (angioplasty).  A procedure to put in a tube (stent) to keep a blocked artery open (stent implant).  Surgery to reroute blood flow around a blocked artery (peripheral bypass surgery).  Surgery to remove dead tissue from an infected wound on the affected limb.  Amputation. This is surgical removal of the affected limb. This may be necessary in cases of acute ischemic limb that are not improved through medical or surgical treatments. HOME CARE INSTRUCTIONS  Take medicines only as directed by your health care provider.  Do not use any tobacco products, including cigarettes, chewing tobacco, or electronic cigarettes. If you need help quitting, ask your health care provider.  Lose  weight if you are overweight, and maintain a healthy weight as directed by your health care provider.  Eat a diet that is low in fat and cholesterol. If you need help, ask your health care provider.  Exercise regularly. Ask your health care provider to suggest some good activities for you.  Use compression stockings or other mechanical devices as directed by your health care provider.  Take good care of your feet.  Wear comfortable shoes that fit well.  Check your feet often for any cuts or sores. SEEK MEDICAL CARE IF:  You have cramps in your legs while walking.  You have leg pain when you are at rest.  You have coldness in a leg or   foot.  Your skin changes.  You have erectile dysfunction.  You have cuts or sores on your feet that are not healing. SEEK IMMEDIATE MEDICAL CARE IF:  Your arm or leg turns cold and blue.  Your arms or legs become red, warm, swollen, painful, or numb.  You have chest pain or trouble breathing.  You suddenly have weakness in your face, arm, or leg.  You become very confused or lose the ability to speak.  You suddenly have a very bad headache or lose your vision.   This information is not intended to replace advice given to you by your health care provider. Make sure you discuss any questions you have with your health care provider.   Document Released: 07/04/2004 Document Revised: 06/17/2014 Document Reviewed: 11/04/2013 Elsevier Interactive Patient Education 2016 Elsevier Inc.  

## 2015-06-23 NOTE — Progress Notes (Signed)
[facility] [place] HISTORY AND PHYSICAL   MRN : [id number]  History of Present Illness:   Samuel [name] is a 70 y.o. male patient of Dr. Kellie Simmering who is status post left superficial femoral artery stent was placed by Dr. Trula Slade in April 2012, the patient is also under regular surveillance for a small known AAA as well as carotid artery stenosis and is status post a right CEA in December of 2006 by Dr. Kellie Simmering. He returns today for yearly follow up.   Bilateral shoulder and hip pain resolved this has been determined to be from Lipitor use and pain has since resolved since Lipitor stopped.  The ptatient denies claudication in legs with walking, denies non healing wounds.   The patient denies any history of TIA or stroke symptoms.Specifically he denies a history of amaurosis fugax or monocular blindness, unilateral facial drooping, hemiparesis, or receptive or expressive aphasia.   The patient has no back or abdominal pain referable to a AAA; he has resolving pain from the lisinopril reaction.  He stopped taking his lisinopril as it caused generalized pain, pt states he told his PCP, will follow up with his PCP soon re replacement medication for htn.   Pt Diabetic: "borderline" Pt smoker: former smoker , quit in 2004  Pt meds include: Statin :no, caused myalgias Betablocker: No ASA: Yes, 81 mg daily Other anticoagulants/antiplatelets: none    Current Outpatient Prescriptions  Medication Sig Dispense Refill  . aspirin 81 MG tablet Take 81 mg by mouth daily.    . Cholecalciferol (VITAMIN D) 2000 UNITS CAPS Take one to two caps daily 30 capsule   . Multiple Vitamin (MULTIVITAMIN) tablet Take 1 tablet by mouth daily.    . sildenafil (REVATIO) 20 MG tablet Take 10 mg by mouth daily as needed.    Marland Kitchen lisinopril-hydrochlorothiazide (PRINZIDE,ZESTORETIC) 10-12.5 MG per tablet TAKE 1 TABLET BY MOUTH DAILY. (Patient not taking: Reported on 06/16/2015) 30 tablet 5    No current facility-administered medications for this visit.    Past Medical History  Diagnosis Date  . Hyperlipidemia   . Hypertension   . History of carotid stenosis     s/p right carotid endarterectomy 12/06  . Genital warts     history of  . PAD (peripheral artery disease) (HCC)     Left leg  . Carotid artery occlusion   . AAA (abdominal aortic aneurysm) [facility])     Social History Social History  Substance Use Topics  . Smoking status: Former Research scientist (life sciences)  . Smokeless tobacco: Never Used     Comment: quit- for porlonged periods he has smoked 2-3 packs a day  . Alcohol Use: Yes    Family History Family History  Problem Relation Age of Onset  . Stroke Mother     deceased age 66 had her firsrt CVA age  24  . Peripheral vascular disease Brother   . Peripheral vascular disease Father     status post leg amputation  secondary to PVD    Surgical History Past Surgical History  Procedure Laterality Date  . Knee surgery  1962  . Appendectomy  1960  . Carotid endarterectomy  05/2005    right  . Left leg stents  2012    Allergies  Allergen Reactions  . Lipitor [Atorvastatin] Other (See Comments)    Muscle pain  . Livalo [Pitavastatin] Other (See Comments)    Muscle aches  . Statins     Current Outpatient Prescriptions  Medication Sig Dispense  Refill  . aspirin 81 MG tablet Take 81 mg by mouth daily.    . Cholecalciferol (VITAMIN D) 2000 UNITS CAPS Take one to two caps daily 30 capsule   . Multiple Vitamin (MULTIVITAMIN) tablet Take 1 tablet by mouth daily.    . sildenafil (REVATIO) 20 MG tablet Take 10 mg by mouth daily as needed.    Marland Kitchen lisinopril-hydrochlorothiazide (PRINZIDE,ZESTORETIC) 10-12.5 MG per tablet TAKE 1 TABLET BY MOUTH DAILY. (Patient not taking: Reported on 06/16/2015) 30 tablet 5   No current facility-administered medications for this visit.     REVIEW OF SYSTEMS: See HPI for pertinent positives and negatives.  Physical Examination Filed Vitals:    06/23/15 1114 06/23/15 1116  BP: 157/83 164/82  Pulse: 75   Height: 5\' 8"  (1.727 m)   Weight: 195 lb 8 oz (88.678 kg)   SpO2: 100%    Body mass index is 29.73 kg/(m^2).  General: WDWN in NAD Gait: Normal HENT: WNL Eyes: Pupils equal Pulmonary: normal non-labored breathing , without Rales, rhonchi, wheezing Cardiac: RRR, no detected murmurs  Abdomen: soft, NT, no palpated masses Skin: no rashes, ulcers noted; no Gangrene , no cellulitis; no open wounds   VASCULAR EXAM  Carotid Bruits Left Right   Negative Negative  Aorta is not palpable Bilateral radial pulses are palpable at 2+   VASCULAR EXAM: Extremities without ischemic changes  without Gangrene ; without open wounds.     LE Pulses LEFT RIGHT   FEMORAL  2+palpable 2+ palpable    POPLITEAL not palpable  not palpable   POSTERIOR TIBIAL  2+palpable   2+palpable    DORSALIS PEDIS  ANTERIOR TIBIAL p2+alpable  not palpable      Musculoskeletal: no muscle wasting or atrophy; no peripheral edema Neurologic: A&O X 3; Appropriate Affect ;  SENSATION: normal; MOTOR FUNCTION: 5/5 Symmetric, CN 2-12 intact Speech is fluent/normal          Non-Invasive Vascular Imaging (06/23/2015):  Left LE arterial Duplex : Patent left LE stents with an area of increased velocity in the proximal SFA likely due to stent overlap; however, this cannot be confirmed and may represent >50% in-stent stenosis. Mild (1-49%) disease throughout the mid-distal stent. No significant change compared to prior exam.  ABI (Date: 06/23/2015)  R: 0.98 (0.96, 04/15/14), DP: triphasic, PT: triphasic, TBI: 0.68  L: 0.96 (0.89), DP: triphasic, PT: triphasic, TBI: 0.71  AAA duplex: Today: 4.07cm x 4.02  cm. Bilateral iliac disease with velocities suggestive of >50% stenosis 04/15/14: 4.06 cm x 4.06 cm No significant change compared to prior exam.  Carotid Duplex: Widely patent right CEA site with <40% hyperplasia near the proximal patch. <40% stenosis of the left ICA No significant change compared to prior exam.   ASSESSMENT:  Samuel [name] is a 70 y.o. male who is status post left superficial femoral artery stent was placed by Dr. Trula Slade in April 2012, the patient is also under regular surveillance for a small known AAA as well as carotid artery stenosis and is status post a right CEA in December of 2006. He has no history of stroke or TIA. He has no abdominal pain and no back pain referable to his AAA.  He has no claudication sx's with walking, no signs of ischemia in his feet/legs.   Today's left LE arterial duplex suggests patent left LE stents with an area of increased velocity in the proximal SFA likely due to stent overlap; however, this cannot be confirmed and may represent >50% in-stent  stenosis. Mild (1-49%) disease throughout the mid-distal stent. No significant change compared to prior exam. ABI's remain normal with all triphasic waveforms.  AAA size remains stable with largest diameter measuring 4.07cm.  Today's carotid duplex suggests a widely patent right CEA site with <40% hyperplasia near the proximal patch. <40% stenosis of the left ICA No significant change compared to prior exam.  Face to face time with patient was 25 minutes. Over 50% of this time was spent on counseling and coordination of care.   PLAN:   Continue active lifestyle which includes lots of walking.  Based on today's exam and non-invasive vascular lab results, the patient will follow up in 1 year with the following tests: carotid duplex, AAA duplex, left LE arterial duplex, and ABI's. I discussed in depth with the patient the nature of atherosclerosis, and emphasized the importance of maximal  medical management including strict control of blood pressure, blood glucose, and lipid levels, obtaining regular exercise, and cessation of smoking.  The patient is aware that without maximal medical management the underlying atherosclerotic disease process will progress, limiting the benefit of any interventions. Consideration for repair of AAA would be made when the size approaches 4.8 or 5.0 cm, growth > 1 cm/yr, and symptomatic status. The patient was given information about stroke prevention and what symptoms should prompt the patient to seek immediate medical care. The patient was given information about AAA including signs, symptoms, treatment,  what symptoms should prompt the patient to seek immediate medical care, and how to minimize the risk of enlargement and rupture of aneurysms. The patient was given information about PAD including signs, symptoms, treatment, what symptoms should prompt the patient to seek immediate medical care, and risk reduction measures to take. Thank you for allowing [place] to participate in this patient's care.  Clemon Chambers, RN, MSN, FNP-C [facility] Office: [phone]  Clinic MD: Scot Dock  06/23/2015 11:30 AM

## 2015-07-10 ENCOUNTER — Ambulatory Visit (AMBULATORY_SURGERY_CENTER): Payer: Self-pay | Admitting: *Deleted

## 2015-07-10 VITALS — Ht 70.0 in | Wt 195.0 lb

## 2015-07-10 DIAGNOSIS — Z1211 Encounter for screening for malignant neoplasm of colon: Secondary | ICD-10-CM

## 2015-07-10 NOTE — Progress Notes (Signed)
Patient denies any allergies to eggs or soy. Patient denies any problems with anesthesia/sedation. Patient denies any oxygen use at home and does not take any diet/weight loss medications. EMMI education assisgned to patient on colonoscopy, this was explained and instructions given to patient. Patient states he just had ultrasound last week on his aneurysm and carotids and it was good, No changes per pt.

## 2015-07-24 ENCOUNTER — Ambulatory Visit (AMBULATORY_SURGERY_CENTER): Payer: Medicare Other | Admitting: Gastroenterology

## 2015-07-24 ENCOUNTER — Encounter: Payer: Self-pay | Admitting: Gastroenterology

## 2015-07-24 VITALS — BP 116/78 | HR 60 | Temp 95.9°F | Resp 12 | Ht 70.0 in | Wt 195.0 lb

## 2015-07-24 DIAGNOSIS — D129 Benign neoplasm of anus and anal canal: Secondary | ICD-10-CM

## 2015-07-24 DIAGNOSIS — E119 Type 2 diabetes mellitus without complications: Secondary | ICD-10-CM | POA: Diagnosis not present

## 2015-07-24 DIAGNOSIS — D123 Benign neoplasm of transverse colon: Secondary | ICD-10-CM

## 2015-07-24 DIAGNOSIS — D120 Benign neoplasm of cecum: Secondary | ICD-10-CM

## 2015-07-24 DIAGNOSIS — D125 Benign neoplasm of sigmoid colon: Secondary | ICD-10-CM

## 2015-07-24 DIAGNOSIS — I10 Essential (primary) hypertension: Secondary | ICD-10-CM | POA: Diagnosis not present

## 2015-07-24 DIAGNOSIS — Z1211 Encounter for screening for malignant neoplasm of colon: Secondary | ICD-10-CM

## 2015-07-24 DIAGNOSIS — K635 Polyp of colon: Secondary | ICD-10-CM | POA: Diagnosis not present

## 2015-07-24 DIAGNOSIS — K621 Rectal polyp: Secondary | ICD-10-CM

## 2015-07-24 DIAGNOSIS — D128 Benign neoplasm of rectum: Secondary | ICD-10-CM

## 2015-07-24 MED ORDER — SODIUM CHLORIDE 0.9 % IV SOLN: 500.0000 mL | INTRAVENOUS | Status: DC

## 2015-07-24 NOTE — Progress Notes (Signed)
  [facility] Post-op Note  Patient: Samuel [name]  Procedure(s) Performed: colonoscopy  Patient Location: [facility] - Recovery Area  Anesthesia Type: Deep Sedation/Propofol  Level of Consciousness: awake, oriented and patient cooperative  Airway and Oxygen Therapy: Patient Spontanous Breathing  Post-op Pain: none  Post-op Assessment:  Post-op Vital signs reviewed, Patient's Cardiovascular Status Stable, Respiratory Function Stable, Patent Airway, No signs of Nausea or vomiting and Pain level controlled  Post-op Vital Signs: Reviewed and stable  Complications: No apparent anesthesia complications  Elbie Statzer E 10:27 AM

## 2015-07-24 NOTE — Patient Instructions (Signed)
YOU HAD AN ENDOSCOPIC PROCEDURE TODAY AT [facility]:   Refer to the procedure report that was given to you for any specific questions about what was found during the examination.  If the procedure report does not answer your questions, please call your gastroenterologist to clarify.  If you requested that your care partner not be given the details of your procedure findings, then the procedure report has been included in a sealed envelope for you to review at your convenience later.  YOU SHOULD EXPECT: Some feelings of bloating in the abdomen. Passage of more gas than usual.  Walking can help get rid of the air that was put into your GI tract during the procedure and reduce the bloating. If you had a lower endoscopy (such as a colonoscopy or flexible sigmoidoscopy) you may notice spotting of blood in your stool or on the toilet paper. If you underwent a bowel prep for your procedure, you may not have a normal bowel movement for a few days.  Please Note:  You might notice some irritation and congestion in your nose or some drainage.  This is from the oxygen used during your procedure.  There is no need for concern and it should clear up in a day or so.  SYMPTOMS TO REPORT IMMEDIATELY:   Following lower endoscopy (colonoscopy or flexible sigmoidoscopy):  Excessive amounts of blood in the stool  Significant tenderness or worsening of abdominal pains  Swelling of the abdomen that is new, acute  Fever of 100F or higher    For urgent or emergent issues, a gastroenterologist can be reached at any hour by calling [phone].   DIET: Your first meal following the procedure should be a small meal and then it is ok to progress to your normal diet. Heavy or fried foods are harder to digest and may make you feel nauseous or bloated.  Likewise, meals heavy in dairy and vegetables can increase bloating.  Drink plenty of fluids but you should avoid alcoholic beverages for 24  hours.  ACTIVITY:  You should plan to take it easy for the rest of today and you should NOT DRIVE or use heavy machinery until tomorrow (because of the sedation medicines used during the test).    FOLLOW UP: Our staff will call the number listed on your records the next business day following your procedure to check on you and address any questions or concerns that you may have regarding the information given to you following your procedure. If we do not reach you, we will leave a message.  However, if you are feeling well and you are not experiencing any problems, there is no need to return our call.  We will assume that you have returned to your regular daily activities without incident.  If any biopsies were taken you will be contacted by phone or by letter within the next 1-3 weeks.  Please call us at [phone] if you have not heard about the biopsies in 3 weeks.    SIGNATURES/CONFIDENTIALITY: You and/or your care partner have signed paperwork which will be entered into your electronic medical record.  These signatures attest to the fact that that the information above on your After Visit Summary has been reviewed and is understood.  Full responsibility of the confidentiality of this discharge information lies with you and/or your care-partner.  Polyps-handout given  Repeat colonoscopy will be determined by pathology.  Hold NSAIDs (ibuprofen, motrin, advil, aleve, motrin, etc.) for 2 weeks, baby  aspirin is ok

## 2015-07-24 NOTE — Op Note (Signed)
[facility]  [address] [place], [address]   COLONOSCOPY PROCEDURE REPORT  PATIENT: Samuel, [name]  MR#: [id number] BIRTHDATE: September 20, 1945 , 22  yrs. old GENDER: male ENDOSCOPIST: Yetta Flock, MD REFERRED BY: Drema Pry DO PROCEDURE DATE:  07/24/2015 PROCEDURE:   Colonoscopy, screening, Colonoscopy with snare polypectomy, and Colonoscopy with biopsy First Screening Colonoscopy - Avg.  risk and is 50 yrs.  old or older - No.  Prior Negative Screening - Now for repeat screening. 10 or more years since last screening  History of Adenoma - Now for follow-up colonoscopy & has been > or = to 3 yrs.  N/A  Polyps removed today? Yes ASA CLASS:   Class II INDICATIONS:Screening for colonic neoplasia and Colorectal Neoplasm Risk Assessment for this procedure is average risk. MEDICATIONS: Propofol 400 mg IV  DESCRIPTION OF PROCEDURE:   After the risks benefits and alternatives of the procedure were thoroughly explained, informed consent was obtained.  The digital rectal exam revealed no abnormalities of the rectum.   The LB [id number]  endoscope was introduced through the anus and advanced to the cecum, which was identified by both the appendix and ileocecal valve. No adverse events experienced.   The quality of the prep was adequate  The instrument was then slowly withdrawn as the colon was fully examined. Estimated blood loss is zero unless otherwise noted in this procedure report.      COLON FINDINGS: Two sessile polyps, 7 and 32mm each were noted in the recum and removed with cold snare.  A diminutive sessile polyp was noted in the cecum and removed with cold forceps.  A 79mm sessile polyp was noted in the transverse colon and removed with cold snare.  A 6mm sessile polyp was noted in the transverse colon and removed with cold forceps.  Two sessile polyps in the splenic flexure, 7 and 71mm in size, were removed with cold snare.  Three sigmoid  polyps, sessile, all 64mm in size, were removed with cold snare.  Multiple sessile polyps were noted in the rectosigmoid colon and rectum grossly consistent with hyperplastic polyps.  One representative sample was removed with cold snare to ensure non adenomatous.  Retroflexed views revealed no abnormalities. The time to cecum = 4.4 Withdrawal time = 27.9   The scope was withdrawn and the procedure completed. COMPLICATIONS: There were no immediate complications.  ENDOSCOPIC IMPRESSION: Multiple polyps removed as outlined above I suspect rectal and rectosigmoid polpys are hyperplastic, one representative sample obtained and placed in separate container  RECOMMENDATIONS: Await pathology results NO NSAIDS for 2 weeks but okay to continue baby aspirin Resume diet Resume medications  eSigned:  Yetta Flock, MD 07/24/2015 10:27 AM   cc: Drema Pry DO, the patient   PATIENT NAME:  Samuel, [name] MR#: [id number]

## 2015-07-24 NOTE — Progress Notes (Signed)
Called to room to assist during endoscopic procedure.  Patient ID and intended procedure confirmed with present staff. Received instructions for my participation in the procedure from the performing physician.  

## 2015-07-25 ENCOUNTER — Telehealth: Payer: Self-pay | Admitting: *Deleted

## 2015-07-25 NOTE — Telephone Encounter (Signed)
  Follow up Call-  Call back number 07/24/2015  Post procedure Call Back phone  # [phone] cell  Permission to leave phone message Yes     Patient questions:  Do you have a fever, pain , or abdominal swelling? No. Pain Score  0 *  Have you tolerated food without any problems? Yes.    Have you been able to return to your normal activities? Yes.    Do you have any questions about your discharge instructions: Diet   No. Medications  No. Follow up visit  No.  Do you have questions or concerns about your Care? No.  Actions: * If pain score is 4 or above: No action needed, pain <4.

## 2015-08-05 ENCOUNTER — Encounter: Payer: Self-pay | Admitting: Gastroenterology

## 2015-08-17 ENCOUNTER — Other Ambulatory Visit: Payer: Self-pay | Admitting: Internal Medicine

## 2016-06-20 ENCOUNTER — Encounter: Payer: Self-pay | Admitting: Family

## 2016-06-25 ENCOUNTER — Encounter: Payer: Self-pay | Admitting: Family

## 2016-06-25 ENCOUNTER — Ambulatory Visit (INDEPENDENT_AMBULATORY_CARE_PROVIDER_SITE_OTHER)
Admission: RE | Admit: 2016-06-25 | Discharge: 2016-06-25 | Disposition: A | Discharge status: Home / Self Care | Payer: Medicare Other | Source: Ambulatory Visit | Attending: Family | Admitting: Family

## 2016-06-25 ENCOUNTER — Ambulatory Visit (HOSPITAL_COMMUNITY)
Admission: RE | Admit: 2016-06-25 | Discharge: 2016-06-25 | Disposition: A | Discharge status: Home / Self Care | Payer: Medicare Other | Source: Ambulatory Visit | Attending: Family | Admitting: Family

## 2016-06-25 ENCOUNTER — Ambulatory Visit (INDEPENDENT_AMBULATORY_CARE_PROVIDER_SITE_OTHER): Payer: Medicare Other | Admitting: Family

## 2016-06-25 VITALS — BP 157/84 | HR 70 | Temp 97.2°F | Resp 16 | Ht 70.0 in | Wt 204.0 lb

## 2016-06-25 DIAGNOSIS — I779 Disorder of arteries and arterioles, unspecified: Secondary | ICD-10-CM

## 2016-06-25 DIAGNOSIS — I714 Abdominal aortic aneurysm, without rupture, unspecified: Secondary | ICD-10-CM

## 2016-06-25 DIAGNOSIS — I6521 Occlusion and stenosis of right carotid artery: Secondary | ICD-10-CM | POA: Diagnosis not present

## 2016-06-25 DIAGNOSIS — I70202 Unspecified atherosclerosis of native arteries of extremities, left leg: Secondary | ICD-10-CM | POA: Diagnosis not present

## 2016-06-25 DIAGNOSIS — Z9889 Other specified postprocedural states: Secondary | ICD-10-CM

## 2016-06-25 DIAGNOSIS — I6523 Occlusion and stenosis of bilateral carotid arteries: Secondary | ICD-10-CM

## 2016-06-25 NOTE — Patient Instructions (Signed)
Stroke Prevention Some medical conditions and behaviors are associated with an increased chance of having a stroke. You may prevent a stroke by making healthy choices and managing medical conditions. How can I reduce my risk of having a stroke?  Stay physically active. Get at least 30 minutes of activity on most or all days.  Do not smoke. It may also be helpful to avoid exposure to secondhand smoke.  Limit alcohol use. Moderate alcohol use is considered to be:  No more than 2 drinks per day for men.  No more than 1 drink per day for nonpregnant women.  Eat healthy foods. This involves:  Eating 5 or more servings of fruits and vegetables a day.  Making dietary changes that address high blood pressure (hypertension), high cholesterol, diabetes, or obesity.  Manage your cholesterol levels.  Making food choices that are high in fiber and low in saturated fat, trans fat, and cholesterol may control cholesterol levels.  Take any prescribed medicines to control cholesterol as directed by your health care provider.  Manage your diabetes.  Controlling your carbohydrate and sugar intake is recommended to manage diabetes.  Take any prescribed medicines to control diabetes as directed by your health care provider.  Control your hypertension.  Making food choices that are low in salt (sodium), saturated fat, trans fat, and cholesterol is recommended to manage hypertension.  Ask your health care provider if you need treatment to lower your blood pressure. Take any prescribed medicines to control hypertension as directed by your health care provider.  If you are 18-39 years of age, have your blood pressure checked every 3-5 years. If you are 40 years of age or older, have your blood pressure checked every year.  Maintain a healthy weight.  Reducing calorie intake and making food choices that are low in sodium, saturated fat, trans fat, and cholesterol are recommended to manage  weight.  Stop drug abuse.  Avoid taking birth control pills.  Talk to your health care provider about the risks of taking birth control pills if you are over 35 years old, smoke, get migraines, or have ever had a blood clot.  Get evaluated for sleep disorders (sleep apnea).  Talk to your health care provider about getting a sleep evaluation if you snore a lot or have excessive sleepiness.  Take medicines only as directed by your health care provider.  For some people, aspirin or blood thinners (anticoagulants) are helpful in reducing the risk of forming abnormal blood clots that can lead to stroke. If you have the irregular heart rhythm of atrial fibrillation, you should be on a blood thinner unless there is a good reason you cannot take them.  Understand all your medicine instructions.  Make sure that other conditions (such as anemia or atherosclerosis) are addressed. Get help right away if:  You have sudden weakness or numbness of the face, arm, or leg, especially on one side of the body.  Your face or eyelid droops to one side.  You have sudden confusion.  You have trouble speaking (aphasia) or understanding.  You have sudden trouble seeing in one or both eyes.  You have sudden trouble walking.  You have dizziness.  You have a loss of balance or coordination.  You have a sudden, severe headache with no known cause.  You have new chest pain or an irregular heartbeat. Any of these symptoms may represent a serious problem that is an emergency. Do not wait to see if the symptoms will go away.   Get medical help at once. Call your local emergency services (911 in [place] Do not drive yourself to the hospital.  This information is not intended to replace advice given to you by your health care provider. Make sure you discuss any questions you have with your health care provider. Document Released: 07/04/2004 Document Revised: 11/02/2015 Document Reviewed: 11/27/2012 Elsevier  Interactive Patient Education  2017 [place].     Abdominal Aortic Aneurysm Blood pumps away from the heart through tubes (blood vessels) called arteries. Aneurysms are weak or damaged places in the wall of an artery. It bulges out like a balloon. An abdominal aortic aneurysm happens in the main artery of the body (aorta). It can burst or tear, causing bleeding inside the body. This is an emergency. It needs treatment right away. What are the causes? The exact cause is unknown. Things that could cause this problem include:  Fat and other substances building up in the lining of a tube.  Swelling of the walls of a blood vessel.  Certain tissue diseases.  Belly (abdominal) trauma.  An infection in the main artery of the body. What increases the risk? There are things that make it more likely for you to have an aneurysm. These include:  Being over the age of 73 years old.  Having high blood pressure (hypertension).  Being a male.  Being white.  Being very overweight (obese).  Having a family history of aneurysm.  Using tobacco products. What are the signs or symptoms? Symptoms depend on the size of the aneurysm and how fast it grows. There may not be symptoms. If symptoms occur, they can include:  Pain (belly, side, lower back, or groin).  Feeling full after eating a small amount of food.  Feeling sick to your stomach (nauseous), throwing up (vomiting), or both.  Feeling a lump in your belly that feels like it is beating (pulsating).  Feeling like you will pass out (faint). How is this treated?  Medicine to control blood pressure and pain.  Imaging tests to see if the aneurysm gets bigger.  Surgery. How is this prevented? To lessen your chance of getting this condition:  Stop smoking. Stop chewing tobacco.  Limit or avoid alcohol.  Keep your blood pressure, blood sugar, and cholesterol within normal limits.  Eat less salt.  Eat foods low in saturated  fats and cholesterol. These are found in animal and whole dairy products.  Eat more fiber. Fiber is found in whole grains, vegetables, and fruits.  Keep a healthy weight.  Stay active and exercise often. This information is not intended to replace advice given to you by your health care provider. Make sure you discuss any questions you have with your health care provider. Document Released: 09/21/2012 Document Revised: 11/02/2015 Document Reviewed: 06/26/2012 Elsevier Interactive Patient Education  2017 [place].      Peripheral Vascular Disease Peripheral vascular disease (PVD) is a disease of the blood vessels that are not part of your heart and brain. A simple term for PVD is poor circulation. In most cases, PVD narrows the blood vessels that carry blood from your heart to the rest of your body. This can result in a decreased supply of blood to your arms, legs, and internal organs, like your stomach or kidneys. However, it most often affects a person's lower legs and feet. There are two types of PVD.  Organic PVD. This is the more common type. It is caused by damage to the structure of blood vessels.  Functional PVD. This is caused by conditions that make blood vessels contract and tighten (spasm). Without treatment, PVD tends to get worse over time. PVD can also lead to acute ischemic limb. This is when an arm or limb suddenly has trouble getting enough blood. This is a medical emergency. Follow these instructions at home:  Take medicines only as told by your doctor.  Do not use any tobacco products, including cigarettes, chewing tobacco, or electronic cigarettes. If you need help quitting, ask your doctor.  Lose weight if you are overweight, and maintain a healthy weight as told by your doctor.  Eat a diet that is low in fat and cholesterol. If you need help, ask your doctor.  Exercise regularly. Ask your doctor for some good activities for you.  Take good care of your  feet.  Wear comfortable shoes that fit well.  Check your feet often for any cuts or sores. Contact a doctor if:  You have cramps in your legs while walking.  You have leg pain when you are at rest.  You have coldness in a leg or foot.  Your skin changes.  You are unable to get or have an erection (erectile dysfunction).  You have cuts or sores on your feet that are not healing. Get help right away if:  Your arm or leg turns cold and blue.  Your arms or legs become red, warm, swollen, painful, or numb.  You have chest pain or trouble breathing.  You suddenly have weakness in your face, arm, or leg.  You become very confused or you cannot speak.  You suddenly have a very bad headache.  You suddenly cannot see. This information is not intended to replace advice given to you by your health care provider. Make sure you discuss any questions you have with your health care provider. Document Released: 08/21/2009 Document Revised: 11/02/2015 Document Reviewed: 11/04/2013 Elsevier Interactive Patient Education  2017 [employer].

## 2016-06-25 NOTE — Progress Notes (Signed)
[facility] [place] [place] HISTORY AND PHYSICAL   MRN : [id number]  History of Present Illness:   Samuel [name] is a 71 y.o. male patient of Dr. Kellie Simmering who is status post left superficial femoral artery stent was placed by Dr. Trula Slade in April 2012, the patient is also under regular surveillance for a small known AAA as well as carotid artery stenosis and is status post a right CEA in December of 2006 by Dr. Kellie Simmering. He returns today for yearly follow up.   The ptatient denies claudication in legs with walking, denies non healing wounds.   The patient denies any history of TIA or stroke symptoms.Specifically he denies a history of amaurosis fugax or monocular blindness, unilateral facial drooping, hemiparesis, or receptive or expressive aphasia.   The patient has no back or abdominal pain referable to a AAA.  Pt Diabetic: "borderline" Pt smoker: former smoker , quit in 2004  Pt meds include: Statin :no, caused myalgias or arthralgias Betablocker: No ASA: Yes, 81 mg daily Other anticoagulants/antiplatelets: none    Current Outpatient Prescriptions  Medication Sig Dispense Refill  . aspirin 81 MG tablet Take 81 mg by mouth daily.    . Cholecalciferol (VITAMIN D) 2000 UNITS CAPS Take one to two caps daily 30 capsule   . lisinopril-hydrochlorothiazide (PRINZIDE,ZESTORETIC) 10-12.5 MG tablet TAKE 1 TABLET BY MOUTH DAILY. 30 tablet 5  . Multiple Vitamin (MULTIVITAMIN) tablet Take 1 tablet by mouth daily.     No current facility-administered medications for this visit.     Past Medical History:  Diagnosis Date  . AAA (abdominal aortic aneurysm) ([place])   . Carotid artery occlusion   . Genital warts    history of  . History of carotid stenosis    s/p right carotid endarterectomy 12/06  . Hyperlipidemia   . Hypertension   . PAD (peripheral artery disease) ([place])    Left leg    Social History Social History  Substance Use Topics  . Smoking status:  Former Research scientist (life sciences)  . Smokeless tobacco: Never Used     Comment: quit- for porlonged periods he has smoked 2-3 packs a day  . Alcohol use 14.4 oz/week    24 Cans of beer per week    Family History Family History  Problem Relation Age of Onset  . Stroke Mother     deceased age 3 had her firsrt CVA age  54  . Peripheral vascular disease Brother   . Peripheral vascular disease Father     status post leg amputation  secondary to PVD  . Colon cancer Neg Hx   . Esophageal cancer Neg Hx   . Prostate cancer Neg Hx   . Rectal cancer Neg Hx   . Stomach cancer Neg Hx     Surgical History Past Surgical History:  Procedure Laterality Date  . APPENDECTOMY  1960  . CAROTID ENDARTERECTOMY  05/2005   right  . COLONOSCOPY    . [place]  . Left Leg stents  2012    Allergies  Allergen Reactions  . Lipitor [Atorvastatin] Other (See Comments)    Muscle pain  . Livalo [Pitavastatin] Other (See Comments)    Muscle aches  . Statins     Muscle aches    Current Outpatient Prescriptions  Medication Sig Dispense Refill  . aspirin 81 MG tablet Take 81 mg by mouth daily.    . Cholecalciferol (VITAMIN D) 2000 UNITS CAPS Take one to two caps daily 30 capsule   .  lisinopril-hydrochlorothiazide (PRINZIDE,ZESTORETIC) 10-12.5 MG tablet TAKE 1 TABLET BY MOUTH DAILY. 30 tablet 5  . Multiple Vitamin (MULTIVITAMIN) tablet Take 1 tablet by mouth daily.     No current facility-administered medications for this visit.      REVIEW OF SYSTEMS: See HPI for pertinent positives and negatives.  Physical Examination Vitals:   06/25/16 0911 06/25/16 0916  BP: (!) 160/81 (!) 157/84  Pulse: 70   Resp: 16   Temp: 97.2 F (36.2 C)   TempSrc: Oral   SpO2: 100%   Weight: 204 lb (92.5 kg)   Height: 5\' 10"  (1.778 m)    Body mass index is 29.27 kg/m.  General:  WDWN in NAD Gait: Normal HENT: WNL Eyes: Pupils equal Pulmonary: normal non-labored breathing, adequate air movement, no rales,  rhonchi, wheezing Cardiac: RRR, no detected murmurs  Abdomen: soft, NT, no palpated masses Skin: no rashes, no ulcers, no cellulitis.  VASCULAR EXAM  Carotid Bruits Left Right   Negative Negative  Aorta is not palpable Bilateral radial pulses are palpable at 2+   VASCULAR EXAM: Extremities without ischemic changes  without Gangrene; without open wounds.     LE Pulses LEFT RIGHT   FEMORAL  2+palpable 2+ palpable    POPLITEAL not palpable  not palpable   POSTERIOR TIBIAL  1+palpable   not palpable    DORSALIS PEDIS  ANTERIOR TIBIAL 1+ palpable  not palpable     Musculoskeletal: no muscle wasting or atrophy; no peripheral edema Neurologic: A&O X 3; Appropriate Affect ;  SENSATION: normal; MOTOR FUNCTION: 5/5 Symmetric, CN 2-12 intact Speech is fluent/normal      ASSESSMENT:  Samuel [name] is a 71 y.o. male who is status post left superficial femoral artery stent was placed by Dr. Trula Slade in April 2012, the patient is also under regular surveillance for a small known AAA as well as carotid artery stenosis and is status post a right CEA in December of 2006. He has no history of stroke or TIA. He has no abdominal pain and no back pain referable to his AAA.  He has no claudication sx's with walking, no signs of ischemia in his feet/legs.   Pt states he has not yet taken his medications this morning, this may be why his blood pressure is elevated. He admits to being less active and gained 9 pounds in the last, he denies any barriers to being more active.   DATA  Today's left LE arterial duplex suggests patent left LE stents with an area of increased velocity in the proximal SFA (75 to 234 cm/s) likely due to stent overlap;  however, this cannot be confirmed and may represent >50% in-stent stenosis. Disease progression in the left SFA stent based on increased velocity ratio and decline of the ABI.  ABI's : Right: 0.76 (0.98 on 06-23-15), bi and monophasic waveforms. Left: 0.84 (was 0.96), biphasic waveforms.  Decline of bilateral ABI.  AAA  Duplex today demonstrates 4.22 cm patent AAA, 1.48 cm right CIA, 1.46 cm left CIA. On 06-23-15 AAA size was 4.07 cm, slight increase in a year.  Today's carotid duplex suggests a widely patent right CEA site with <40% hyperplasia near the proximal patch. <40% stenosis of the left ICA. Right ECA stenosis.  Bilateral vertebral artery flow is antegrade.  Bilateral subclavian artery waveforms are normal.  No significant change compared to last exam on 06-23-15.  Face to face time with patient was 25 minutes. Over 50% of this time was spent on counseling  and coordination of care.   PLAN:   Graduated walking program discussed and how to achieve.   Based on today's exam and non-invasive vascular lab results, the patient will follow up in 1 year with the following tests: carotid duplex, AAA duplex, left LE arterial duplex, and ABI's.  I discussed in depth with the patient the nature of atherosclerosis, and emphasized the importance of maximal medical management including strict control of blood pressure, blood glucose, and lipid levels, obtaining regular exercise, and cessation of smoking.  The patient is aware that without maximal medical management the underlying atherosclerotic disease process will progress, limiting the benefit of any interventions. Consideration for repair of AAA would be made when the size approaches 4.8 or 5.0 cm, growth > 1 cm/yr, and symptomatic status. The patient was given information about stroke prevention and what symptoms should prompt the patient to seek immediate medical care. The patient was given information about AAA including signs,  symptoms, treatment,  what symptoms should prompt the patient to seek immediate medical care, and how to minimize the risk of enlargement and rupture of aneurysms. The patient was given information about PAD including signs, symptoms, treatment, what symptoms should prompt the patient to seek immediate medical care, and risk reduction measures to take. Thank you for allowing [place] to participate in this patient's care.  Clemon Chambers, RN, MSN, FNP-C [facility] Office: [phone]  Clinic MD: Early 06/25/2016 9:26 AM

## 2016-06-27 NOTE — Addendum Note (Signed)
Addended by: Lianne Cure A on: 06/27/2016 02:32 PM   Modules accepted: Orders

## 2017-06-30 ENCOUNTER — Ambulatory Visit (INDEPENDENT_AMBULATORY_CARE_PROVIDER_SITE_OTHER)
Admission: RE | Admit: 2017-06-30 | Discharge: 2017-06-30 | Disposition: A | Discharge status: Home / Self Care | Payer: Medicare Other | Source: Ambulatory Visit | Attending: Family | Admitting: Family

## 2017-06-30 ENCOUNTER — Encounter: Payer: Self-pay | Admitting: Family

## 2017-06-30 ENCOUNTER — Ambulatory Visit (HOSPITAL_COMMUNITY)
Admission: RE | Admit: 2017-06-30 | Discharge: 2017-06-30 | Disposition: A | Discharge status: Home / Self Care | Payer: Medicare Other | Source: Ambulatory Visit | Attending: Family | Admitting: Family

## 2017-06-30 ENCOUNTER — Ambulatory Visit (INDEPENDENT_AMBULATORY_CARE_PROVIDER_SITE_OTHER): Payer: Medicare Other | Admitting: Family

## 2017-06-30 VITALS — BP 160/80 | HR 64 | Temp 96.9°F | Resp 18 | Ht 70.0 in | Wt 195.8 lb

## 2017-06-30 DIAGNOSIS — I714 Abdominal aortic aneurysm, without rupture, unspecified: Secondary | ICD-10-CM

## 2017-06-30 DIAGNOSIS — Z87891 Personal history of nicotine dependence: Secondary | ICD-10-CM | POA: Diagnosis not present

## 2017-06-30 DIAGNOSIS — I779 Disorder of arteries and arterioles, unspecified: Secondary | ICD-10-CM | POA: Insufficient documentation

## 2017-06-30 DIAGNOSIS — Z9889 Other specified postprocedural states: Secondary | ICD-10-CM

## 2017-06-30 DIAGNOSIS — I10 Essential (primary) hypertension: Secondary | ICD-10-CM | POA: Insufficient documentation

## 2017-06-30 DIAGNOSIS — I6521 Occlusion and stenosis of right carotid artery: Secondary | ICD-10-CM | POA: Diagnosis not present

## 2017-06-30 DIAGNOSIS — E785 Hyperlipidemia, unspecified: Secondary | ICD-10-CM | POA: Diagnosis not present

## 2017-06-30 DIAGNOSIS — I6522 Occlusion and stenosis of left carotid artery: Secondary | ICD-10-CM | POA: Insufficient documentation

## 2017-06-30 DIAGNOSIS — E119 Type 2 diabetes mellitus without complications: Secondary | ICD-10-CM | POA: Insufficient documentation

## 2017-06-30 LAB — VAS US CAROTID
LCCADSYS: -106 cm/s
LEFT ECA DIAS: -39 cm/s
LEFT VERTEBRAL DIAS: -17 cm/s
LICADSYS: -108 cm/s
LICAPDIAS: -36 cm/s
Left CCA dist dias: -25 cm/s
Left CCA prox dias: 16 cm/s
Left CCA prox sys: 117 cm/s
Left ICA dist dias: -31 cm/s
Left ICA prox sys: -145 cm/s
RCCAPDIAS: 24 cm/s
RIGHT CCA MID DIAS: -19 cm/s
RIGHT ECA DIAS: 43 cm/s
RIGHT VERTEBRAL DIAS: -19 cm/s
Right CCA prox sys: 64 cm/s
Right cca dist sys: -100 cm/s

## 2017-06-30 NOTE — Patient Instructions (Addendum)
Before your next abdominal ultrasound:  Take two Extra-Strength Gas-X capsules at bedtime the night before the test. Take another two Extra-Strength Gas-X capsules 3 hours before the test.  Avoid gas forming foods the day before the test.       Abdominal Aortic Aneurysm Blood pumps away from the heart through tubes (blood vessels) called arteries. Aneurysms are weak or damaged places in the wall of an artery. It bulges out like a balloon. An abdominal aortic aneurysm happens in the main artery of the body (aorta). It can burst or tear, causing bleeding inside the body. This is an emergency. It needs treatment right away. What are the causes? The exact cause is unknown. Things that could cause this problem include:  Fat and other substances building up in the lining of a tube.  Swelling of the walls of a blood vessel.  Certain tissue diseases.  Belly (abdominal) trauma.  An infection in the main artery of the body.  What increases the risk? There are things that make it more likely for you to have an aneurysm. These include:  Being over the age of 74 years old.  Having high blood pressure (hypertension).  Being a male.  Being white.  Being very overweight (obese).  Having a family history of aneurysm.  Using tobacco products.  What are the signs or symptoms? Symptoms depend on the size of the aneurysm and how fast it grows. There may not be symptoms. If symptoms occur, they can include:  Pain (belly, side, lower back, or groin).  Feeling full after eating a small amount of food.  Feeling sick to your stomach (nauseous), throwing up (vomiting), or both.  Feeling a lump in your belly that feels like it is beating (pulsating).  Feeling like you will pass out (faint).  How is this treated?  Medicine to control blood pressure and pain.  Imaging tests to see if the aneurysm gets bigger.  Surgery. How is this prevented? To lessen your chance of getting this  condition:  Stop smoking. Stop chewing tobacco.  Limit or avoid alcohol.  Keep your blood pressure, blood sugar, and cholesterol within normal limits.  Eat less salt.  Eat foods low in saturated fats and cholesterol. These are found in animal and whole dairy products.  Eat more fiber. Fiber is found in whole grains, vegetables, and fruits.  Keep a healthy weight.  Stay active and exercise often.  This information is not intended to replace advice given to you by your health care provider. Make sure you discuss any questions you have with your health care provider. Document Released: 09/21/2012 Document Revised: 11/02/2015 Document Reviewed: 06/26/2012 Elsevier Interactive Patient Education  2017 [employer].     Stroke Prevention Some health problems and behaviors may make it more likely for you to have a stroke. Below are ways to lessen your risk of having a stroke.  Be active for at least 30 minutes on most or all days.  Do not smoke. Try not to be around others who smoke.  Do not drink too much alcohol. ? Do not have more than 2 drinks a day if you are a man. ? Do not have more than 1 drink a day if you are a woman and are not pregnant.  Eat healthy foods, such as fruits and vegetables. If you were put on a specific diet, follow the diet as told.  Keep your cholesterol levels under control through diet and medicines. Look for foods that are low  in saturated fat, trans fat, cholesterol, and are high in fiber.  If you have diabetes, follow all diet plans and take your medicine as told.  Ask your doctor if you need treatment to lower your blood pressure. If you have high blood pressure (hypertension), follow all diet plans and take your medicine as told by your doctor.  If you are 64-79 years old, have your blood pressure checked every 3-5 years. If you are age 39 or older, have your blood pressure checked every year.  Keep a healthy weight. Eat foods that are low in  calories, salt, saturated fat, trans fat, and cholesterol.  Do not take drugs.  Avoid birth control pills, if this applies. Talk to your doctor about the risks of taking birth control pills.  Talk to your doctor if you have sleep problems (sleep apnea).  Take all medicine as told by your doctor. ? You may be told to take aspirin or blood thinner medicine. Take this medicine as told by your doctor. ? Understand your medicine instructions.  Make sure any other conditions you have are being taken care of.  Get help right away if:  You suddenly lose feeling (you feel numb) or have weakness in your face, arm, or leg.  Your face or eyelid hangs down to one side.  You suddenly feel confused.  You have trouble talking (aphasia) or understanding what people are saying.  You suddenly have trouble seeing in one or both eyes.  You suddenly have trouble walking.  You are dizzy.  You lose your balance or your movements are clumsy (uncoordinated).  You suddenly have a very bad headache and you do not know the cause.  You have new chest pain.  Your heart feels like it is fluttering or skipping a beat (irregular heartbeat). Do not wait to see if the symptoms above go away. Get help right away. Call your local emergency services (911 in [place] Do not drive yourself to the hospital. This information is not intended to replace advice given to you by your health care provider. Make sure you discuss any questions you have with your health care provider. Document Released: 11/26/2011 Document Revised: 11/02/2015 Document Reviewed: 11/27/2012 Elsevier Interactive Patient Education  2018 [employer].     Peripheral Vascular Disease Peripheral vascular disease (PVD) is a disease of the blood vessels that are not part of your heart and brain. A simple term for PVD is poor circulation. In most cases, PVD narrows the blood vessels that carry blood from your heart to the rest of your body. This can  result in a decreased supply of blood to your arms, legs, and internal organs, like your stomach or kidneys. However, it most often affects a person's lower legs and feet. There are two types of PVD.  Organic PVD. This is the more common type. It is caused by damage to the structure of blood vessels.  Functional PVD. This is caused by conditions that make blood vessels contract and tighten (spasm).  Without treatment, PVD tends to get worse over time. PVD can also lead to acute ischemic limb. This is when an arm or limb suddenly has trouble getting enough blood. This is a medical emergency. Follow these instructions at home:  Take medicines only as told by your doctor.  Do not use any tobacco products, including cigarettes, chewing tobacco, or electronic cigarettes. If you need help quitting, ask your doctor.  Lose weight if you are overweight, and maintain a healthy weight as  told by your doctor.  Eat a diet that is low in fat and cholesterol. If you need help, ask your doctor.  Exercise regularly. Ask your doctor for some good activities for you.  Take good care of your feet. ? Wear comfortable shoes that fit well. ? Check your feet often for any cuts or sores. Contact a doctor if:  You have cramps in your legs while walking.  You have leg pain when you are at rest.  You have coldness in a leg or foot.  Your skin changes.  You are unable to get or have an erection (erectile dysfunction).  You have cuts or sores on your feet that are not healing. Get help right away if:  Your arm or leg turns cold and blue.  Your arms or legs become red, warm, swollen, painful, or numb.  You have chest pain or trouble breathing.  You suddenly have weakness in your face, arm, or leg.  You become very confused or you cannot speak.  You suddenly have a very bad headache.  You suddenly cannot see. This information is not intended to replace advice given to you by your health care  provider. Make sure you discuss any questions you have with your health care provider. Document Released: 08/21/2009 Document Revised: 11/02/2015 Document Reviewed: 11/04/2013 Elsevier Interactive Patient Education  2017 [employer].

## 2017-06-30 NOTE — Progress Notes (Signed)
[facility] [place] HISTORY AND PHYSICAL   CC: Follow up extracranial carotid artery stenosis, abdominal aortic aneurysm, and peripheral artery occlusive disease   History of Present Illness:   Samuel [name] is a 72 y.o. male patient of Dr. Kellie Simmering who is status post left superficial femoral artery stent was placed by Dr. Trula Slade in April 2012, the patient is also under regular surveillance for a small known AAA as well as carotid artery stenosis and is status post a right CEA in December of 2006 by Dr. Kellie Simmering. He returns today for follow up.   The ptatient denies non healing wounds.  His calves hurt after walking up 2 flights of stairs, which is worse than it had been at his last visit on 06-25-16.  If he walks fast on flat surface his calves hurt at about 100 feet, which is worse than a year ago.   He goes up and down stairs multiple times/day.  He walks 2-4 hours/day.   He reports that 120-130/70 was his blood pressure at the [place] when he was there in November 2018.   The patient denies any history of TIA or stroke symptoms.Specifically he denies a history of amaurosis fugax or monocular blindness, unilateral facial drooping, hemiparesis, or receptive or expressive aphasia.   The patient has no back or abdominal pain referable to a AAA.  Pt Diabetic: "borderline" Pt smoker: formersmoker , quit in 2004  Pt meds include: Statin :no, caused myalgias or arthralgias, pt states his cholesterol is higher than it should be Betablocker: No ASA: Yes, 81 mg daily Other anticoagulants/antiplatelets: none   Current Outpatient Medications  Medication Sig Dispense Refill  . aspirin 81 MG tablet Take 81 mg by mouth daily.    . Cholecalciferol (VITAMIN D) 2000 UNITS CAPS Take one to two caps daily 30 capsule   . lisinopril-hydrochlorothiazide (PRINZIDE,ZESTORETIC) 10-12.5 MG tablet TAKE 1 TABLET BY MOUTH DAILY. 30 tablet 5  . Multiple Vitamin (MULTIVITAMIN)  tablet Take 1 tablet by mouth daily.     No current facility-administered medications for this visit.     Past Medical History:  Diagnosis Date  . AAA (abdominal aortic aneurysm) ([place])   . Carotid artery occlusion   . Genital warts    history of  . History of carotid stenosis    s/p right carotid endarterectomy 12/06  . Hyperlipidemia   . Hypertension   . PAD (peripheral artery disease) ([facility])    Left leg    Social History Social History   Tobacco Use  . Smoking status: Former Research scientist (life sciences)  . Smokeless tobacco: Never Used  . Tobacco comment: quit- for porlonged periods he has smoked 2-3 packs a day  Substance Use Topics  . Alcohol use: Yes    Alcohol/week: 14.4 oz    Types: 24 Cans of beer per week  . Drug use: No    Family History Family History  Problem Relation Age of Onset  . Stroke Mother        deceased age 37 had her firsrt CVA age  17  . Peripheral vascular disease Brother   . Peripheral vascular disease Father        status post leg amputation  secondary to PVD  . Colon cancer Neg Hx   . Esophageal cancer Neg Hx   . Prostate cancer Neg Hx   . Rectal cancer Neg Hx   . Stomach cancer Neg Hx     Surgical History Past Surgical History:  Procedure Laterality  Date  . APPENDECTOMY  1960  . CAROTID ENDARTERECTOMY  05/2005   right  . COLONOSCOPY    . [place]  . Left Leg stents  2012    Allergies  Allergen Reactions  . Lipitor [Atorvastatin] Other (See Comments)    Muscle pain  . Livalo [Pitavastatin] Other (See Comments)    Muscle aches  . Statins     Muscle aches    Current Outpatient Medications  Medication Sig Dispense Refill  . aspirin 81 MG tablet Take 81 mg by mouth daily.    . Cholecalciferol (VITAMIN D) 2000 UNITS CAPS Take one to two caps daily 30 capsule   . lisinopril-hydrochlorothiazide (PRINZIDE,ZESTORETIC) 10-12.5 MG tablet TAKE 1 TABLET BY MOUTH DAILY. 30 tablet 5  . Multiple Vitamin (MULTIVITAMIN) tablet Take 1 tablet by  mouth daily.     No current facility-administered medications for this visit.      REVIEW OF SYSTEMS: See HPI for pertinent positives and negatives.  Physical Examination Vitals:   06/30/17 0943 06/30/17 0946  BP: (!) 145/69 (!) 160/80  Pulse: 64   Resp: 18   Temp: (!) 96.9 F (36.1 C)   TempSrc: Oral   SpO2: 100%   Weight: 195 lb 12.8 oz (88.8 kg)   Height: 5\' 10"  (1.778 m)    Body mass index is 28.09 kg/m.  General:  WDWN male in NAD Gait: Normal HENT: No gross abnormalities  Eyes: PERRLA Pulmonary: normal non-labored breathing, adequate air movement in all fields, no rales, rhonchi, or wheezing Cardiac: Regular rhythm and rate, no detected murmur Abdomen: soft, NT, no palpated masses Skin: no rashes, no ulcers, no cellulitis.  VASCULAR EXAM  Carotid Bruits Left Right   Negative Negative   Abdominal aortic pulse is slightly palpable Bilateral radial pulses are palpable at 2+   VASCULAR EXAM: Extremitieswithout ischemic changes  without Gangrene; without open wounds.     LE Pulses LEFT RIGHT   FEMORAL  2+palpable 2+ palpable    POPLITEAL not palpable  not palpable   POSTERIOR TIBIAL  not palpable   not palpable    DORSALIS PEDIS  ANTERIOR TIBIAL not palpable  not palpable     Musculoskeletal: no muscle wasting or atrophy; no peripheral edema Neurologic:A&O X 3; appropriate affect, sensation is normal; motor function is 5/5 symmetric, CN 2-12 intact, speech is fluent/normal    Psychiatric: Normal thought content, mood appropriate to clinical situation.      ASSESSMENT:  Samuel [name] is a 72 y.o. male who is status post left superficial femoral artery stent placed by Dr. Trula Slade in April 2012, the  patient is also under regular surveillance for a small known AAA as well as carotid artery stenosis and is status post a right CEA in December of 2006. He has no history of stroke or TIA.  He has no abdominal pain and no back pain referable to his AAA.   If he walks fast on flat surface his calves hurt at about 100 feet, which is worse than a year ago.  He goes up and down stairs multiple times/day.  He walks 2-4 hours/day.    DATA   Carotid Duplex (06/30/17): Widely patent right CEA site with no restenosis 1-39% stenosis of the left ICA. Bilateral vertebral artery flow is antegrade.  Bilateral subclavian artery waveforms are normal.  No significant change compared to exams on 06-23-15 and 06-25-16.  AAA Duplex (06/30/17): 4.3 cm largest diameter at mid abdominal aorta, compared to 4.22  cm on 06-25-16 (1.48 cm right CIA, 1.46 cm left CIA.). Right CIA: 1.6 cm; Left CIA: 1.4 cm Stable AAA.  Left LE arterial Duplex (06/30/17): Arterial obstruction involving the left SFA and/or popliteal artery (50-99%) (218 cm/s). No significant change compared to the exam on 06-25-16.   ABI (Date: 06/30/2017):  R:   ABI: 0.68 (was 0.76 on 06-25-16),   PT: mono (was mono)  DP: bi (was bi)  TBI:  0.39   L:   ABI: 0.88 (was 0.84),   PT: tri (was bi)  DP: tri (was bi)  TBI: 0.54 Stable bilateral ABI with moderate disease on the right and mild on the left. Improved waveforms in left LE.    PLAN:   Graduated walking program discussed and how to achieve.   Based on today's exam and non-invasive vascular lab results, the patient will follow up in 1 year with the following tests: carotid duplex, AAA duplex, left LE arterial duplex, and ABI's. I advised him to notify us if he develops worsening pain in his calves with walking, or any concerns re the circulation in his feet or legs; if he does return for this, he will need ABI's and left LE arterial duplex.   I discussed in depth with the  patient the nature of atherosclerosis, and emphasized the importance of maximal medical management including strict control of blood pressure, blood glucose, and lipid levels, obtaining regular exercise, and cessation of smoking.  The patient is aware that without maximal medical management the underlying atherosclerotic disease process will progress, limiting the benefit of any interventions.  Consideration for repair of AAA would be made when the size approaches 5.0 cm, growth > 1 cm/yr, and symptomatic status. The patient was given information about AAA including signs, symptoms, treatment,  what symptoms should prompt the patient to seek immediate medical care, and how to minimize the risk of enlargement and rupture of aneurysms.   The patient was given information about stroke prevention and what symptoms should prompt the patient to seek immediate medical care.  The patient was given information about PAD including signs, symptoms, treatment, what symptoms should prompt the patient to seek immediate medical care, and risk reduction measures to take.  Thank you for allowing [place] to participate in this patient's care.  Clemon Chambers, RN, MSN, FNP-C [facility] Office: [phone]  Clinic MD: Trula Slade 06/30/2017 10:40 AM

## 2017-10-03 ENCOUNTER — Encounter: Payer: Self-pay | Admitting: Emergency Medicine

## 2017-10-03 ENCOUNTER — Ambulatory Visit: Payer: Medicare Other | Admitting: Emergency Medicine

## 2017-10-03 ENCOUNTER — Other Ambulatory Visit: Payer: Self-pay

## 2017-10-03 VITALS — BP 140/80 | HR 84 | Temp 98.8°F | Resp 16 | Ht 69.0 in | Wt 192.4 lb

## 2017-10-03 DIAGNOSIS — M109 Gout, unspecified: Secondary | ICD-10-CM

## 2017-10-03 DIAGNOSIS — M79674 Pain in right toe(s): Secondary | ICD-10-CM | POA: Diagnosis not present

## 2017-10-03 DIAGNOSIS — Z8739 Personal history of other diseases of the musculoskeletal system and connective tissue: Secondary | ICD-10-CM | POA: Diagnosis not present

## 2017-10-03 MED ORDER — DICLOFENAC SODIUM 75 MG PO TBEC: 75.0000 mg | DELAYED_RELEASE_TABLET | Freq: Two times a day (BID) | ORAL | 0 refills | Status: AC

## 2017-10-03 MED ORDER — COLCHICINE 0.6 MG PO TABS
ORAL_TABLET | ORAL | 3 refills | Status: DC
Start: 2017-10-03 — End: 2019-06-18

## 2017-10-03 MED ORDER — HYDROCODONE-ACETAMINOPHEN 5-325 MG PO TABS: 1.0000 | ORAL_TABLET | Freq: Four times a day (QID) | ORAL | 0 refills | Status: DC | PRN

## 2017-10-03 NOTE — Patient Instructions (Addendum)
   IF you received an x-ray today, you will receive an invoice from [facility]. Please contact [facility] at [phone] with questions or concerns regarding your invoice.   IF you received labwork today, you will receive an invoice from [facility]. Please contact [facility] at [phone] with questions or concerns regarding your invoice.   Our billing staff will not be able to assist you with questions regarding bills from these companies.  You will be contacted with the lab results as soon as they are available. The fastest way to get your results is to activate your My Chart account. Instructions are located on the last page of this paperwork. If you have not heard from us regarding the results in 2 weeks, please contact this office.      Gout Gout is painful swelling that can happen in some of your joints. Gout is a type of arthritis. This condition is caused by having too much uric acid in your body. Uric acid is a chemical that is made when your body breaks down substances called purines. If your body has too much uric acid, sharp crystals can form and build up in your joints. This causes pain and swelling. Gout attacks can happen quickly and be very painful (acute gout). Over time, the attacks can affect more joints and happen more often (chronic gout). Follow these instructions at home: During a Gout Attack  If directed, put ice on the painful area: ? Put ice in a plastic bag. ? Place a towel between your skin and the bag. ? Leave the ice on for 20 minutes, 2-3 times a day.  Rest the joint as much as possible. If the joint is in your leg, you may be given crutches to use.  Raise (elevate) the painful joint above the level of your heart as often as you can.  Drink enough fluids to keep your pee (urine) clear or pale yellow.  Take over-the-counter and prescription medicines only as told by your doctor.  Do not drive or use heavy machinery while taking  prescription pain medicine.  Follow instructions from your doctor about what you can or cannot eat and drink.  Return to your normal activities as told by your doctor. Ask your doctor what activities are safe for you. Avoiding Future Gout Attacks  Follow a low-purine diet as told by a specialist (dietitian) or your doctor. Avoid foods and drinks that have a lot of purines, such as: ? Liver. ? Kidney. ? Anchovies. ? Asparagus. ? Herring. ? Mushrooms ? Mussels. ? Beer.  Limit alcohol intake to no more than 1 drink a day for nonpregnant women and 2 drinks a day for men. One drink equals 12 oz of beer, 5 oz of wine, or 1 oz of hard liquor.  Stay at a healthy weight or lose weight if you are overweight. If you want to lose weight, talk with your doctor. It is important that you do not lose weight too fast.  Start or continue an exercise plan as told by your doctor.  Drink enough fluids to keep your pee clear or pale yellow.  Take over-the-counter and prescription medicines only as told by your doctor.  Keep all follow-up visits as told by your doctor. This is important. Contact a doctor if:  You have another gout attack.  You still have symptoms of a gout attack after10 days of treatment.  You have problems (side effects) because of your medicines.  You have chills or a fever.    You have burning pain when you pee (urinate).  You have pain in your lower back or belly. Get help right away if:  You have very bad pain.  Your pain cannot be controlled.  You cannot pee. This information is not intended to replace advice given to you by your health care provider. Make sure you discuss any questions you have with your health care provider. Document Released: 03/05/2008 Document Revised: 11/02/2015 Document Reviewed: 03/09/2015 Elsevier Interactive Patient Education  2018 Elsevier Inc.  

## 2017-10-03 NOTE — Progress Notes (Signed)
Samuel [name] 72 y.o.   Chief Complaint  Patient presents with  . Gout    right great toe x 5 days    HISTORY OF PRESENT ILLNESS: This is a 72 y.o. male complaining of pain to the right big toe.  Has a history of gout.  Flare started 5 days ago.  Denies injury.  No fever or chills.  Took some naproxen with some relief.  No other significant symptomatology.  HPI   Prior to Admission medications   Medication Sig Start Date End Date Taking? Authorizing Provider  aspirin 81 MG tablet Take 81 mg by mouth daily.   Yes [provider]  Cholecalciferol (VITAMIN D) 2000 UNITS CAPS Take one to two caps daily 09/01/13  Yes Yoo, Doe-Hyun R, DO  lisinopril-hydrochlorothiazide (PRINZIDE,ZESTORETIC) 10-12.5 MG tablet TAKE 1 TABLET BY MOUTH DAILY. 08/17/15  Yes Yoo, Doe-Hyun R, DO  Multiple Vitamin (MULTIVITAMIN) tablet Take 1 tablet by mouth daily.   Yes [provider]  sildenafil (VIAGRA) 25 MG tablet Take 25 mg by mouth daily as needed for erectile dysfunction.   Yes [provider]    Allergies  Allergen Reactions  . Lipitor [Atorvastatin] Other (See Comments)    Muscle pain  . Livalo [Pitavastatin] Other (See Comments)    Muscle aches  . Statins     Muscle aches    Patient Active Problem List   Diagnosis Date Noted  . Aftercare following surgery of the circulatory system, [place] 03/02/2013  . Proximal muscle weakness 09/10/2012  . Occlusion and stenosis of carotid artery without mention of cerebral infarction 02/27/2012  . Abdominal aneurysm without mention of rupture 02/27/2012  . Gout 09/27/2010  . DIABETES MELLITUS, TYPE II, BORDERLINE 04/17/2009  . CONDYLOMA ACUMINATUM 10/16/2007  . Hyperlipidemia 10/16/2007  . ALCOHOL USE 10/16/2007  . Essential hypertension 10/16/2007  . Peripheral vascular disease ([place]) 10/16/2007  . CAROTID BRUIT, RIGHT 10/16/2007    Past Medical History:  Diagnosis Date  . AAA (abdominal aortic aneurysm) ([place])   . Carotid  artery occlusion   . Genital warts    history of  . History of carotid stenosis    s/p right carotid endarterectomy 12/06  . Hyperlipidemia   . Hypertension   . PAD (peripheral artery disease) ([facility])    Left leg    Past Surgical History:  Procedure Laterality Date  . APPENDECTOMY  1960  . CAROTID ENDARTERECTOMY  05/2005   right  . COLONOSCOPY    . [place]  . Left Leg stents  2012    Social History   Socioeconomic History  . Marital status: Married    Spouse name: Not on file  . Number of children: Not on file  . Years of education: Not on file  . Highest education level: Not on file  Occupational History    Employer: RETIRED    Comment: self employed - [employer]  Social Needs  . Financial resource strain: Not on file  . Food insecurity:    Worry: Not on file    Inability: Not on file  . Transportation needs:    Medical: Not on file    Non-medical: Not on file  Tobacco Use  . Smoking status: Former Research scientist (life sciences)  . Smokeless tobacco: Never Used  . Tobacco comment: quit- for porlonged periods he has smoked 2-3 packs a day  Substance and Sexual Activity  . Alcohol use: Yes    Alcohol/week: 14.4 oz    Types: 24 Cans  of beer per week  . Drug use: No  . Sexual activity: Not on file  Lifestyle  . Physical activity:    Days per week: Not on file    Minutes per session: Not on file  . Stress: Not on file  Relationships  . Social connections:    Talks on phone: Not on file    Gets together: Not on file    Attends religious service: Not on file    Active member of club or organization: Not on file    Attends meetings of clubs or organizations: Not on file    Relationship status: Not on file  . Intimate partner violence:    Fear of current or ex partner: Not on file    Emotionally abused: Not on file    Physically abused: Not on file    Forced sexual activity: Not on file  Other Topics Concern  . Not on file  Social History Narrative   Married -to his  second wife Idamae Schuller).  Has a daughter from his first marriage who is in her early 77s.     He drinks approximately 4 alcoholic beverages, beer, a day.  He has done so since age 57.  Denies any binge drinking or heavy alcohol use.  Quit tobacco 2 year ago.  He has smoked on and off x20-30 years.  For prolonged periods he has smoked 2-3 packs a day.     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  She lives in [place], [place]    Family History  Problem Relation Age of Onset  . Stroke Mother        deceased age 12 had her firsrt CVA age  55  . Hyperlipidemia Mother   . Peripheral vascular disease Father        status post leg amputation  secondary to PVD  . Hyperlipidemia Father   . Peripheral vascular disease Brother   . Colon cancer Neg Hx   . Esophageal cancer Neg Hx   . Prostate cancer Neg Hx   . Rectal cancer Neg Hx   . Stomach cancer Neg Hx      Review of Systems  Constitutional: Negative.  Negative for chills and fever.  HENT: Negative.  Negative for sore throat.   Eyes: Negative.   Respiratory: Negative.  Negative for cough and shortness of breath.   Cardiovascular: Negative.  Negative for chest pain and palpitations.  Gastrointestinal: Negative.  Negative for abdominal pain, diarrhea, nausea and vomiting.  Genitourinary: Negative.   Musculoskeletal: Positive for joint pain (Right big toe).  Skin: Negative.  Negative for rash.  Neurological: Negative.  Negative for dizziness and headaches.  Endo/Heme/Allergies: Negative.   All other systems reviewed and are negative.   Vitals:   10/03/17 1049  BP: 140/80  Pulse: 84  Resp: 16  Temp: 98.8 F (37.1 C)  SpO2: 99%    Physical Exam  Constitutional: He is oriented to person, place, and time. He appears well-developed and well-nourished.  HENT:  Head: Normocephalic and atraumatic.  Eyes: Pupils are equal, round, and reactive to light. EOM are normal.  Neck: Normal range  of motion. Neck supple.  Cardiovascular: Normal rate.  Pulmonary/Chest: Effort normal.  Musculoskeletal:  Right foot: Positive swelling, erythema, and tenderness of the first metatarsal phalangeal joint.  Compatible with gout  Neurological: He is alert and oriented to person, place, and time.  Skin: Skin is warm and  dry. Capillary refill takes less than 2 seconds.  Psychiatric: He has a normal mood and affect. His behavior is normal.  Vitals reviewed.   A total of 25 minutes was spent in the room with the patient, greater than 50% of which was in counseling/coordination of care regarding gout, treatment, medications, diet, and prevention.  ASSESSMENT & PLAN: Samuel [name] was seen today for gout.  Diagnoses and all orders for this visit:  Right big toe acute gouty arthritis Comments: Right big toe Orders: -     diclofenac (VOLTAREN) 75 MG EC tablet; Take 1 tablet (75 mg total) by mouth 2 (two) times daily for 5 days. -     colchicine 0.6 MG tablet; Take 1.2 mg today then 0.6 mg 1 hour later.  Then take 1 tablet daily for 5 days.  History of gout  Pain of toe of right foot -     HYDROcodone-acetaminophen (NORCO) 5-325 MG tablet; Take 1 tablet by mouth every 6 (six) hours as needed for moderate pain.    Patient Instructions       IF you received an x-ray today, you will receive an invoice from [facility]. Please contact [facility] at [phone] with questions or concerns regarding your invoice.   IF you received labwork today, you will receive an invoice from [facility]. Please contact [facility] at [phone] with questions or concerns regarding your invoice.   Our billing staff will not be able to assist you with questions regarding bills from these companies.  You will be contacted with the lab results as soon as they are available. The fastest way to get your results is to activate your My Chart account. Instructions are located on the last page of this  paperwork. If you have not heard from [place] regarding the results in 2 weeks, please contact this office.     Gout Gout is painful swelling that can happen in some of your joints. Gout is a type of arthritis. This condition is caused by having too much uric acid in your body. Uric acid is a chemical that is made when your body breaks down substances called purines. If your body has too much uric acid, sharp crystals can form and build up in your joints. This causes pain and swelling. Gout attacks can happen quickly and be very painful (acute gout). Over time, the attacks can affect more joints and happen more often (chronic gout). Follow these instructions at home: During a Gout Attack  If directed, put ice on the painful area: ? Put ice in a plastic bag. ? Place a towel between your skin and the bag. ? Leave the ice on for 20 minutes, 2-3 times a day.  Rest the joint as much as possible. If the joint is in your leg, you may be given crutches to use.  Raise (elevate) the painful joint above the level of your heart as often as you can.  Drink enough fluids to keep your pee (urine) clear or pale yellow.  Take over-the-counter and prescription medicines only as told by your doctor.  Do not drive or use heavy machinery while taking prescription pain medicine.  Follow instructions from your doctor about what you can or cannot eat and drink.  Return to your normal activities as told by your doctor. Ask your doctor what activities are safe for you. Avoiding Future Gout Attacks  Follow a low-purine diet as told by a specialist (dietitian) or your doctor. Avoid foods and drinks that have a lot  of purines, such as: ? Liver. ? Kidney. ? Anchovies. ? Asparagus. ? Herring. ? Mushrooms ? Mussels. ? Beer.  Limit alcohol intake to no more than 1 drink a day for nonpregnant women and 2 drinks a day for men. One drink equals 12 oz of beer, 5 oz of wine, or 1 oz of hard liquor.  Stay at a  healthy weight or lose weight if you are overweight. If you want to lose weight, talk with your doctor. It is important that you do not lose weight too fast.  Start or continue an exercise plan as told by your doctor.  Drink enough fluids to keep your pee clear or pale yellow.  Take over-the-counter and prescription medicines only as told by your doctor.  Keep all follow-up visits as told by your doctor. This is important. Contact a doctor if:  You have another gout attack.  You still have symptoms of a gout attack after10 days of treatment.  You have problems (side effects) because of your medicines.  You have chills or a fever.  You have burning pain when you pee (urinate).  You have pain in your lower back or belly. Get help right away if:  You have very bad pain.  Your pain cannot be controlled.  You cannot pee. This information is not intended to replace advice given to you by your health care provider. Make sure you discuss any questions you have with your health care provider. Document Released: 03/05/2008 Document Revised: 11/02/2015 Document Reviewed: 03/09/2015 Elsevier Interactive Patient Education  2018 Elsevier Inc.      Agustina Caroli, MD [facility]

## 2018-08-04 ENCOUNTER — Other Ambulatory Visit: Payer: Self-pay

## 2018-08-04 DIAGNOSIS — I6521 Occlusion and stenosis of right carotid artery: Secondary | ICD-10-CM

## 2018-08-04 DIAGNOSIS — I714 Abdominal aortic aneurysm, without rupture, unspecified: Secondary | ICD-10-CM

## 2018-08-04 DIAGNOSIS — Z9889 Other specified postprocedural states: Secondary | ICD-10-CM

## 2018-08-04 DIAGNOSIS — I779 Disorder of arteries and arterioles, unspecified: Secondary | ICD-10-CM

## 2018-08-07 ENCOUNTER — Ambulatory Visit (INDEPENDENT_AMBULATORY_CARE_PROVIDER_SITE_OTHER)
Admission: RE | Admit: 2018-08-07 | Discharge: 2018-08-07 | Disposition: A | Discharge status: Home / Self Care | Payer: Medicare Other | Source: Ambulatory Visit | Attending: Vascular Surgery | Admitting: Vascular Surgery

## 2018-08-07 ENCOUNTER — Other Ambulatory Visit: Payer: Self-pay

## 2018-08-07 ENCOUNTER — Ambulatory Visit (INDEPENDENT_AMBULATORY_CARE_PROVIDER_SITE_OTHER): Payer: Medicare Other | Admitting: Physician Assistant

## 2018-08-07 ENCOUNTER — Encounter: Payer: Self-pay | Admitting: Family

## 2018-08-07 ENCOUNTER — Ambulatory Visit (HOSPITAL_COMMUNITY)
Admission: RE | Admit: 2018-08-07 | Discharge: 2018-08-07 | Disposition: A | Discharge status: Home / Self Care | Payer: Medicare Other | Source: Ambulatory Visit | Attending: Vascular Surgery | Admitting: Vascular Surgery

## 2018-08-07 ENCOUNTER — Other Ambulatory Visit: Payer: Self-pay | Admitting: Vascular Surgery

## 2018-08-07 VITALS — BP 176/78 | HR 62 | Temp 97.0°F | Resp 18 | Ht 70.0 in | Wt 191.5 lb

## 2018-08-07 DIAGNOSIS — I714 Abdominal aortic aneurysm, without rupture, unspecified: Secondary | ICD-10-CM

## 2018-08-07 DIAGNOSIS — I779 Disorder of arteries and arterioles, unspecified: Secondary | ICD-10-CM

## 2018-08-07 DIAGNOSIS — I6523 Occlusion and stenosis of bilateral carotid arteries: Secondary | ICD-10-CM | POA: Diagnosis not present

## 2018-08-07 DIAGNOSIS — Z9889 Other specified postprocedural states: Secondary | ICD-10-CM | POA: Diagnosis not present

## 2018-08-07 DIAGNOSIS — I6521 Occlusion and stenosis of right carotid artery: Secondary | ICD-10-CM

## 2018-08-07 NOTE — Progress Notes (Signed)
History of Present Illness:  Patient is a 73 y.o. year old male who presents for evaluation of claudication s/p left  SFA,  AAA, and carotid stenosis.  The patient currently describes a cramping sensation in the right lower extremity. There is not rest pain.  There is no history of ulcerations on the feet.  He denies lumbar or abdominal pain.  He denies weakness, amaurosis and aphasia.    Atherosclerotic risk factors and other medical problems include HTN managed with Lisinopril.    Past Medical History:  Diagnosis Date  . AAA (abdominal aortic aneurysm) ([place])   . Carotid artery occlusion   . Genital warts    history of  . History of carotid stenosis    s/p right carotid endarterectomy 12/06  . Hyperlipidemia   . Hypertension   . PAD (peripheral artery disease) ([facility])    Left leg    Past Surgical History:  Procedure Laterality Date  . APPENDECTOMY  1960  . CAROTID ENDARTERECTOMY  05/2005   right  . COLONOSCOPY    . [place]  . Left Leg stents  2012    ROS:   General:  No weight loss, Fever, chills  HEENT: No recent headaches, no nasal bleeding, no visual changes, no sore throat  Neurologic: No dizziness, blackouts, seizures. No recent symptoms of stroke or mini- stroke. No recent episodes of slurred speech, or temporary blindness.  Cardiac: No recent episodes of chest pain/pressure, no shortness of breath at rest.  No shortness of breath with exertion.  Denies history of atrial fibrillation or irregular heartbeat  Vascular: No history of rest pain in feet.  No history of claudication.  No history of non-healing ulcer, No history of DVT   Pulmonary: No home oxygen, no productive cough, no hemoptysis,  No asthma or wheezing  Musculoskeletal:  [ ]  Arthritis, [ ]  Low back pain,  [x ] Joint pain  Hematologic:No history of hypercoagulable state.  No history of easy bleeding.  No history of anemia  Gastrointestinal: No hematochezia or melena,  No  gastroesophageal reflux, no trouble swallowing  Urinary: [ ]  chronic Kidney disease, [ ]  on HD - [ ]  MWF or [ ]  TTHS, [ ]  Burning with urination, [ ]  Frequent urination, [ ]  Difficulty urinating;   Skin: No rashes  Psychological: No history of anxiety,  No history of depression  Social History Social History   Tobacco Use  . Smoking status: Former Research scientist (life sciences)  . Smokeless tobacco: Never Used  . Tobacco comment: quit- for porlonged periods he has smoked 2-3 packs a day  Substance Use Topics  . Alcohol use: Yes    Alcohol/week: 24.0 standard drinks    Types: 24 Cans of beer per week  . Drug use: No    Family History Family History  Problem Relation Age of Onset  . Stroke Mother        deceased age 42 had her firsrt CVA age  45  . Hyperlipidemia Mother   . Peripheral vascular disease Father        status post leg amputation  secondary to PVD  . Hyperlipidemia Father   . Peripheral vascular disease Brother   . Colon cancer Neg Hx   . Esophageal cancer Neg Hx   . Prostate cancer Neg Hx   . Rectal cancer Neg Hx   . Stomach cancer Neg Hx     Allergies  Allergies  Allergen Reactions  . Lipitor [Atorvastatin] Other (  See Comments)    Muscle pain  . Livalo [Pitavastatin] Other (See Comments)    Muscle aches  . Statins     Muscle aches     Current Outpatient Medications  Medication Sig Dispense Refill  . aspirin 81 MG tablet Take 81 mg by mouth daily.    . Cholecalciferol (VITAMIN D) 2000 UNITS CAPS Take one to two caps daily 30 capsule   . colchicine 0.6 MG tablet Take 1.2 mg today then 0.6 mg 1 hour later.  Then take 1 tablet daily for 5 days. 15 tablet 3  . HYDROcodone-acetaminophen (NORCO) 5-325 MG tablet Take 1 tablet by mouth every 6 (six) hours as needed for moderate pain. 12 tablet 0  . lisinopril-hydrochlorothiazide (PRINZIDE,ZESTORETIC) 10-12.5 MG tablet TAKE 1 TABLET BY MOUTH DAILY. 30 tablet 5  . Multiple Vitamin (MULTIVITAMIN) tablet Take 1 tablet by mouth  daily.    . sildenafil (VIAGRA) 25 MG tablet Take 25 mg by mouth daily as needed for erectile dysfunction.     No current facility-administered medications for this visit.     Physical Examination  Vitals:   08/07/18 1002 08/07/18 1004  BP: (!) 175/79 (!) 176/78  Pulse: 62 62  Resp: 18   Temp: (!) 97 F (36.1 C)   TempSrc: Oral   SpO2: 100%   Weight: 191 lb 7.5 oz (86.8 kg)   Height: 5\' 10"  (1.778 m)     Body mass index is 27.47 kg/m.  General:  Alert and oriented, no acute distress HEENT: Normal, normocephalic Neck: No bruit or JVD Pulmonary: Clear to auscultation bilaterally Cardiac: Regular Rate and Rhythm without murmur Abdomen: Soft, non-tender, non-distended, no mass, no scars, palpable aortic pulse Skin: No rash Extremity Pulses:  2+ radial, brachial, femoral, right dorsalis pedis, posterior tibial pulses, non palpable on the left LE.   Musculoskeletal: No deformity or edema  Neurologic: Upper and lower extremity motor 5/5 and symmetric  DATA:  ABI Findings: +---------+------------------+-----+----------+--------+ Right    Rt Pressure (mmHg)IndexWaveform  Comment  +---------+------------------+-----+----------+--------+ Brachial 179                    triphasic          +---------+------------------+-----+----------+--------+ PTA      98                0.55 biphasic           +---------+------------------+-----+----------+--------+ DP       92                0.51 monophasic         +---------+------------------+-----+----------+--------+ Great Toe62                0.35 Abnormal           +---------+------------------+-----+----------+--------+  +---------+------------------+-----+---------+-------+ Left     Lt Pressure (mmHg)IndexWaveform Comment +---------+------------------+-----+---------+-------+ Brachial 174                    triphasic        +---------+------------------+-----+---------+-------+ PTA       144               0.80 triphasic        +---------+------------------+-----+---------+-------+ DP       142               0.79 triphasic        +---------+------------------+-----+---------+-------+ Galvin Proffer  0.68 Normal           +---------+------------------+-----+---------+-------+  +-------+-----------+-----------+------------+------------+ ABI/TBIToday's ABIToday's TBIPrevious ABIPrevious TBI +-------+-----------+-----------+------------+------------+ Right  0.55       0.35       0.68        0.39         +-------+-----------+-----------+------------+------------+ Left   0.80       0.68       0.88        0.54         +-------+-----------+-----------+------------+------------+  Left Stent(s): +-------------------+--------+--------------+---------+------------------------+ Left proximal SFA  PSV cm/sStenosis      Waveform Comments                 to proximal Pop A                                                          +-------------------+--------+--------------+---------+------------------------+ Prox to Stent      47                    triphasicHeterogenous plaque      +-------------------+--------+--------------+---------+------------------------+ Proximal Stent     162                   biphasic Heterogenous plaque      +-------------------+--------+--------------+---------+------------------------+ Mid Stent          233     50-99%        biphasic Post-Stenotic                                       stenosis               Turbulence; Heterogenous                                                   plaque                   +-------------------+--------+--------------+---------+------------------------+ Distal Stent       72                    biphasic Heterogenous plaque      +-------------------+--------+--------------+---------+------------------------+ Distal to Stent    56                     biphasic Heterogenous plaque      +-------------------+--------+--------------+---------+------------------------+ Mobile linear structure noted within mid stent     Summary: Left: Stenosis is noted within the Mid stent stent. Unable to duplicate elevated velocities at proximal stent. Diffuse heterogenous plaque noted throughout length of stent. Summary: Right Carotid: Velocities in the right ICA are consistent with a 1-39% stenosis.                Non-hemodynamically significant plaque <50% noted in the CCA. The                ECA appears >50% stenosed. Patent right carotid endarterectomy                with plaque formation noted.  Although the ECA velocity is decreased, 2D and color imaging                suggests a stenosis of >50%.  Left Carotid: Velocities in the left ICA are consistent with a 40-59% stenosis.               Non-hemodynamically significant plaque noted in the CCA. The ECA               appears <50% stenosed.  Abdominal Aorta Findings: +-----------+-------+----------+----------+----------+--------+--------+ Location   AP (cm)Trans (cm)PSV (cm/s)Waveform  ThrombusComments +-----------+-------+----------+----------+----------+--------+--------+ Supraceliac2.73   2.73      59        triphasic                  +-----------+-------+----------+----------+----------+--------+--------+ Proximal   2.64   2.74      60        triphasic                  +-----------+-------+----------+----------+----------+--------+--------+ Mid        2.73   2.82      90        triphasic                  +-----------+-------+----------+----------+----------+--------+--------+ Distal     5.45   5.40      42        monophasicPresent fusiform +-----------+-------+----------+----------+----------+--------+--------+ RT CIA Prox1.6    1.5       72        monophasic                  +-----------+-------+----------+----------+----------+--------+--------+ RT CIA Mid 1.9    2.1       88        biphasic                   +-----------+-------+----------+----------+----------+--------+--------+ LT CIA Prox1.3    1.5       120       triphasic                  +-----------+-------+----------+----------+----------+--------+--------+     Summary: Abdominal Aorta: There is evidence of abnormal dilitation of the Distal Abdominal aorta. The largest aortic measurement is 5.4 cm. Eccentric outpouching of the medial anterior aneurysm.  The largest aortic diameter has increased compared to prior exam. Previous diameter measurement was 4.3 cm obtained on 07/31/2017.  Ectatic mid right CIA measuring up to 2.1 cm in max diameter.      ASSESSMENT:  PAD s/p right SFA stent with mild stenosis and palpable distal pulses Left LE stable PAD without non healing wounds, but he does have symptoms of claudication.  Carotid stable stenosis without symptoms of stroke or TIA. S/P right CEA without restenosis.    AAA asymptomatic without lumbar or abdominal pain.  He has had an increase in diameter from 4.3 cm obtained on 07/31/2017 to  5.4 cm. Eccentric outpouching of the medial anterior aneurysm.    PLAN:  I have discussed his AAA size change with DR. Donzetta Matters today and we have scheduled him for CTA chest/abdomin and pelvis.  He will f/u with Dr. Donzetta Matters to review the CTA and discuss surgical intervention EVAR verses open AAA repair.  He may need left LE angiogram with possible intervention prior to EVAR if this is the plan after his next visit.   Roxy Horseman PA-C [facility] Office: [phone]  MD in clinic [place]

## 2018-08-08 ENCOUNTER — Encounter: Payer: Self-pay | Admitting: Gastroenterology

## 2018-08-24 ENCOUNTER — Ambulatory Visit
Admission: RE | Admit: 2018-08-24 | Discharge: 2018-08-24 | Disposition: A | Discharge status: Home / Self Care | Payer: Medicare Other | Source: Ambulatory Visit | Attending: Vascular Surgery | Admitting: Vascular Surgery

## 2018-08-24 DIAGNOSIS — I714 Abdominal aortic aneurysm, without rupture, unspecified: Secondary | ICD-10-CM

## 2018-08-24 MED ORDER — IOPAMIDOL (ISOVUE-370) INJECTION 76%
75.0000 mL | Freq: Once | INTRAVENOUS | Status: AC | PRN
  Administered 2018-08-24: 75 mL via INTRAVENOUS

## 2018-08-28 ENCOUNTER — Ambulatory Visit: Payer: Medicare Other | Admitting: Vascular Surgery

## 2018-08-28 ENCOUNTER — Telehealth: Payer: Self-pay

## 2018-08-28 ENCOUNTER — Telehealth: Payer: Medicare Other | Admitting: Vascular Surgery

## 2018-08-28 NOTE — Telephone Encounter (Signed)
Returned pt's call; pt is requesting to speak with Dr. Donzetta Matters regarding his CT results and need for surgery. Sent staff message to Dr. Donzetta Matters.

## 2018-08-31 ENCOUNTER — Other Ambulatory Visit: Payer: Self-pay

## 2018-09-04 ENCOUNTER — Ambulatory Visit: Payer: Medicare Other | Admitting: Vascular Surgery

## 2018-09-04 ENCOUNTER — Telehealth (INDEPENDENT_AMBULATORY_CARE_PROVIDER_SITE_OTHER): Payer: Medicare Other | Admitting: Vascular Surgery

## 2018-09-04 DIAGNOSIS — I714 Abdominal aortic aneurysm, without rupture, unspecified: Secondary | ICD-10-CM

## 2018-09-04 NOTE — Telephone Encounter (Signed)
Virtual Visit via Telephone Note      I connected with Samuel [name] on 09/04/2018 at  by telephone and verified that I am speaking with the correct person using two identifiers.  Patient is at his home and I am in my office.   I discussed the limitations, risks, security and privacy concerns of performing an evaluation and management service by telephone and the availability of in person appointments. I also discussed with the patient that there may be a patient responsible charge related to this service. The patient expressed understanding and agreed to proceed.   History of Present Illness: 73yo male, previous left sfa stent and right CEA. Has some recurrent left leg symptoms. No new back or abdominal complaints. CT performed on 08/24/18.    Observations/Objective: No new abdominal or back complaints  I personally reviewed the CT scan both prior to and while talking with the patient which demonstrates 5.1 cm aneurysm in the infrarenal position.  Assessment and Plan: 73 year old male known abdominal aortic aneurysm was 5.45 cm by recent duplex demonstrated be 5.1 by CT scan.  I discussed with him the risk benefits of having an aneurysm the size.  He has no new abdominal or back complaints.  He does have some recurrent left lower extremity claudication symptoms but these are not life limiting at this time and the stent does have some amount of stenosis.  He also has SMA and celiac artery stenosis with replaced right hepatic artery but no symptoms from this either.  He remains on aspirin.  We will get him follow-up in 6 months with repeat CT scan.  I have counseled him on avoiding straining exercises as well as fluoroquinolone antibiotics and he demonstrates good understanding.  Follow Up Instructions: 6 months with CT angio abdomen and pelvis   I discussed the assessment and treatment plan with the patient. The patient was provided an opportunity to ask questions and all were answered.  The patient agreed with the plan and demonstrated an understanding of the instructions.   The patient was advised to call back or seek an in-person evaluation if the symptoms worsen or if the condition fails to improve as anticipated.  I provided 14 minutes of non-face-to-face time during this encounter.   Samuel Likes C. Donzetta Matters, MD [facility] Office: [phone] Pager: [phone]

## 2018-10-14 ENCOUNTER — Telehealth: Payer: Self-pay

## 2018-10-14 NOTE — Telephone Encounter (Signed)
Pt called and wanted to know the plan for surgery now that everything is starting to open back up with the Covid stuff. Also wanted to know if he could take his ED pill.    Called patient and advised him that per Dr Claretha Cooper last televisit note, he was going to follow up with another CT scan in 6 months and evaluate then what needs to be done in regards to his aneurysm.  Pt asked when 6 months would be and I told him around the end of September. He stated that this would be an issue because he had a [place] reunion that he didn't want to miss. Told him that as long as he was not having any complications from the aneurysm that he could wait till after the reunion to schedule surgery. He said that this was still an issue as there was a lot of things that he wanted to do and he was afraid that some of them would be risky due to the aneurysm and he would just like to go ahead and get it taken care of. Pt declines any new concerns or complications.   Pt was offered a virtual visit with Dr Donzetta Matters to discuss moving forward with having surgery. Appt for Friday at 1145.   York Cerise, CMA

## 2018-10-15 ENCOUNTER — Telehealth: Payer: Self-pay | Admitting: Vascular Surgery

## 2018-10-15 NOTE — Telephone Encounter (Signed)
I attempted contact with [name] at his listed mobil and home numbers on Oct 15, 2018 at 4:44 pm to obtain consent for his virtual visit scheduled with Dr. Donzetta Matters on Oct 16, 2018.  There was no answer at either number. [name]

## 2018-10-16 ENCOUNTER — Ambulatory Visit (INDEPENDENT_AMBULATORY_CARE_PROVIDER_SITE_OTHER): Payer: Medicare Other | Admitting: Vascular Surgery

## 2018-10-16 ENCOUNTER — Other Ambulatory Visit: Payer: Self-pay

## 2018-10-16 ENCOUNTER — Encounter: Payer: Self-pay | Admitting: Vascular Surgery

## 2018-10-16 DIAGNOSIS — I714 Abdominal aortic aneurysm, without rupture, unspecified: Secondary | ICD-10-CM

## 2018-10-16 NOTE — Progress Notes (Signed)
Virtual Visit via Video Note   I connected with Samuel [name] on 10/16/2018 using the [employer] virtual platform and verified that I was speaking with the correct person using two identifiers. Patient was located at at home in his basement and was alone and I was at the vascular vein specialist office.   The limitations of evaluation and management by telemedicine and the availability of in person appointments have been previously discussed with the patient and are documented in the patients chart. The patient expressed understanding and consented to proceed.   PCP: [name], MD  Chief Complaint: Concern of abdominal aortic aneurysm  History of Present Illness: Samuel [name] is a 73 y.o. male with with no abdominal aortic aneurysm.  This did grow greater than 1 cm within the year.  Wound was measured by CT it was less than 5-1/2 cm although with the growth greater than a year by duplex we discussed repair.  Patient is very nervous about traveling which she has scheduled coming up this summer would like to have this repaired.  I reviewed his CT scan while talking with him.  Past Medical History:  Diagnosis Date  . AAA (abdominal aortic aneurysm) ([place])   . Carotid artery occlusion   . Genital warts    history of  . History of carotid stenosis    s/p right carotid endarterectomy 12/06  . Hyperlipidemia   . Hypertension   . PAD (peripheral artery disease) ([facility])    Left leg    Past Surgical History:  Procedure Laterality Date  . APPENDECTOMY  1960  . CAROTID ENDARTERECTOMY  05/2005   right  . COLONOSCOPY    . [facility]  . Left Leg stents  2012    Current Meds  Medication Sig  . aspirin 81 MG tablet Take 81 mg by mouth daily.  . Cholecalciferol (VITAMIN D) 2000 UNITS CAPS Take one to two caps daily  . colchicine 0.6 MG tablet Take 1.2 mg today then 0.6 mg 1 hour later.  Then take 1 tablet daily for 5 days.  Marland Kitchen lisinopril-hydrochlorothiazide  (PRINZIDE,ZESTORETIC) 10-12.5 MG tablet TAKE 1 TABLET BY MOUTH DAILY.  . Multiple Vitamin (MULTIVITAMIN) tablet Take 1 tablet by mouth daily.  . sildenafil (VIAGRA) 25 MG tablet Take 25 mg by mouth daily as needed for erectile dysfunction.    12 system ROS was negative unless otherwise noted in HPI  Observations/Objective: Vitals:   10/16/18 1138  BP: (!) 152/72  Pulse: 68   Awake alert and oriented Nonlabored respirations Moving all extremities without limitation Neurologically intact.  Assessment and Plan: 73 year old male with abdominal aortic aneurysm that grew greater than 1 cm within the year.  We will get him fixed after COVID 19 pandemic.  I discussed with him the risk benefits alternatives.  Appears to be endovascular candidate and we will get him sized for a Gore graft.  I discussed this with him as well.  I discussed the assessment and treatment plan with the patient. The patient was provided an opportunity to ask questions and all were answered. The patient agreed with the plan and demonstrated an understanding of the instructions.   The patient was advised to call back or seek an in-person evaluation if the symptoms worsen or if the condition fails to improve as anticipated.  I spent 15 minutes with the patient via video encounter.   Signed, Servando Snare [facility] Office: [phone]  10/16/2018, 11:49 AM

## 2018-10-16 NOTE — Telephone Encounter (Signed)
Virtual Visit Pre-Appointment Phone Call  Today, I spoke with Samuel [name] and performed the following actions:  1. I explained that we are currently trying to limit exposure to the COVID-19 virus by seeing patients at home rather than in the office.  I explained that the visits are best done by video, but can be done by telephone.  I asked the patient if a virtual visit that the patient would like to try instead of coming into the office. Samuel [name] agreed to proceed with the virtual visit scheduled with Dr. Donzetta Matters on May 8.  2. I confirmed the BEST phone number to call the day of the visit and- I included this in appointment notes.  3. I asked if the patient had access to (through a family member/friend) a smartphone with video capability to be used for his visit?"  The patient said YES.  4. I confirmed consent verbally as listed below (a-f). a. This visit is being performed in the setting of COVID-19 with the patients provider on May 8 at 11:45 am. b. All virtual visits are billed to your insurance company just like a normal visit would be.   c. We'd like you to understand that the technology does not allow for your provider to perform an examination, and thus may limit your provider's ability to fully assess your condition.  d. If your provider identifies any concerns that need to be evaluated in person, we will make arrangements to do so.   e. Finally, though the technology is pretty good, we cannot assure that it will always work on either your or our end, and in the setting of a video visit, we may have to convert it to a phone-only visit.  In either situation, we cannot ensure that we have a secure connection.   f. Are you willing to proceed?" STAFF: Did the patient verbally acknowledge consent to telehealth visit? Document YES/NO here: YES  2. I advised the patient to be prepared - I asked that the patient, on the day of his visit, record any information possible with the  equipment at his home, such as blood pressure, pulse, oxygen saturation, and your weight and write them all down. I asked the patient to have a pen and paper handy nearby the day of the visit as well.  3. I informed the patient that the visit with the doctor would start with a text to the smartphone number given to [place] by the patient.   4. I Informed patient they will receive a phone call 15 minutes prior to their appointment time from a [place] or nurse to review medications, allergies, etc. to prepare for the visit.    TELEPHONE CALL NOTE  BRAXTEN MEMMER has been deemed a candidate for a follow-up tele-health visit to limit community exposure during the Covid-19 pandemic. I spoke with the patient via phone to ensure availability of phone/video source, confirm preferred email & phone number, and discuss instructions and expectations.  I reminded [name] to be prepared with any vital sign and/or heart rhythm information that could potentially be obtained via home monitoring, at the time of his visit. I reminded [name] to expect a phone call prior to his visit.  [name] 10/16/2018 9:19 AM     FULL LENGTH CONSENT FOR TELE-HEALTH VISIT   I hereby voluntarily request, consent and authorize [facility] and its employed or contracted physicians, Engineer, materials, nurse practitioners or other licensed  health care professionals (the Practitioner), to provide me with telemedicine health care services (the Services") as deemed necessary by the treating Practitioner. I acknowledge and consent to receive the Services by the Practitioner via telemedicine. I understand that the telemedicine visit will involve communicating with the Practitioner through live audiovisual communication technology and the disclosure of certain medical information by electronic transmission. I acknowledge that I have been given the opportunity to request an in-person assessment or other available  alternative prior to the telemedicine visit and am voluntarily participating in the telemedicine visit.  I understand that I have the right to withhold or withdraw my consent to the use of telemedicine in the course of my care at any time, without affecting my right to future care or treatment, and that the Practitioner or I may terminate the telemedicine visit at any time. I understand that I have the right to inspect all information obtained and/or recorded in the course of the telemedicine visit and may receive copies of available information for a reasonable fee.  I understand that some of the potential risks of receiving the Services via telemedicine include:   Delay or interruption in medical evaluation due to technological equipment failure or disruption;  Information transmitted may not be sufficient (e.g. poor resolution of images) to allow for appropriate medical decision making by the Practitioner; and/or   In rare instances, security protocols could fail, causing a breach of personal health information.  Furthermore, I acknowledge that it is my responsibility to provide information about my medical history, conditions and care that is complete and accurate to the best of my ability. I acknowledge that Practitioner's advice, recommendations, and/or decision may be based on factors not within their control, such as incomplete or inaccurate data provided by me or distortions of diagnostic images or specimens that may result from electronic transmissions. I understand that the practice of medicine is not an exact science and that Practitioner makes no warranties or guarantees regarding treatment outcomes. I acknowledge that I will receive a copy of this consent concurrently upon execution via email to the email address I last provided but may also request a printed copy by calling the office of [place].    I understand that my insurance will be billed for this visit.   I have read or had  this consent read to me.  I understand the contents of this consent, which adequately explains the benefits and risks of the Services being provided via telemedicine.   I have been provided ample opportunity to ask questions regarding this consent and the Services and have had my questions answered to my satisfaction.  I give my informed consent for the services to be provided through the use of telemedicine in my medical care  By participating in this telemedicine visit I agree to the above.

## 2018-10-27 ENCOUNTER — Encounter: Payer: Self-pay | Admitting: *Deleted

## 2018-10-27 ENCOUNTER — Other Ambulatory Visit: Payer: Self-pay | Admitting: *Deleted

## 2018-11-17 NOTE — Progress Notes (Signed)
[id number] [address], [place] [address] Phone: [phone] Fax: [phone]  [facility] [place], [address] [place] [address] Phone: [phone] Fax: [phone]  [facility] [phone] - [place], [address] [place] [address] Phone: [phone] Fax: [phone]      Your procedure is scheduled on Thursday, November 19, 2018.  Report to [facility] Main Entrance "A" at 0800 A.M., and check in at the Admitting office.  Call this number if you have problems the morning of surgery:  [phone]  Call [phone] if you have any questions prior to your surgery date Monday-Friday 8am-4pm    Remember:  Do not eat or drink after midnight.    Take these medicines the morning of surgery with A SIP OF WATER: NONE   7 days prior to surgery STOP taking any Aspirin (unless otherwise instructed by your surgeon), Aleve, Naproxen, Ibuprofen, Motrin, Advil, Goody's, BC's, all herbal medications, fish oil, and all vitamins.  Follow your surgeon's instructions on when to stop Aspirin.  If no instructions were given by your surgeon then you will need to call the office to get those instructions.      The Morning of Surgery  Do not wear jewelry, make-up or nail polish.  Do not wear lotions, powders, or perfumes/colognes, or deodorant  Do not shave 48 hours prior to surgery.  Men may shave face and neck.  Do not bring valuables to the hospital.  [facility] is not responsible for any belongings or valuables.  If you are a smoker, DO NOT Smoke 24 hours prior to surgery IF you wear a CPAP at night please bring your mask, tubing, and machine the morning of surgery   Remember that you must have someone to transport you home after your surgery, and remain with you for 24 hours if  you are discharged the same day.   Contacts, glasses, hearing aids, dentures or bridgework may not be worn into surgery.    Leave your suitcase in the car.  After surgery it may be brought to your room.  For patients admitted to the hospital, discharge time will be determined by your treatment team.  Patients discharged the day of surgery will not be allowed to drive home.    Special instructions:   [facility]- Preparing For Surgery  Before surgery, you can play an important role. Because skin is not sterile, your skin needs to be as free of germs as possible. You can reduce the number of germs on your skin by washing with CHG (chlorahexidine gluconate) Soap before surgery.  CHG is an antiseptic cleaner which kills germs and bonds with the skin to continue killing germs even after washing.    Oral Hygiene is also important to reduce your risk of infection.  Remember - BRUSH YOUR TEETH THE MORNING OF SURGERY WITH YOUR REGULAR TOOTHPASTE  Please do not use if you have an allergy to CHG or antibacterial soaps. If your skin becomes reddened/irritated stop using the CHG.  Do not shave (including legs and underarms) for at least 48 hours prior to first CHG shower. It is OK to shave your face.  Please follow these instructions carefully.   1. Shower the NIGHT BEFORE SURGERY and the MORNING OF SURGERY with CHG Soap.   2. If you chose  to wash your hair, wash your hair first as usual with your normal shampoo.  3. After you shampoo, rinse your hair and body thoroughly to remove the shampoo.  4. Use CHG as you would any other liquid soap. You can apply CHG directly to the skin and wash gently with a scrungie or a clean washcloth.   5. Apply the CHG Soap to your body ONLY FROM THE NECK DOWN.  Do not use on open wounds or open sores. Avoid contact with your eyes, ears, mouth and genitals (private parts). Wash Face and genitals (private parts)  with your normal soap.   6. Wash thoroughly, paying  special attention to the area where your surgery will be performed.  7. Thoroughly rinse your body with warm water from the neck down.  8. DO NOT shower/wash with your normal soap after using and rinsing off the CHG Soap.  9. Pat yourself dry with a CLEAN TOWEL.  10. Wear CLEAN PAJAMAS to bed the night before surgery, wear comfortable clothes the morning of surgery  11. Place CLEAN SHEETS on your bed the night of your first shower and DO NOT SLEEP WITH PETS.    Day of Surgery: Shower as stated above. Do not apply any deodorants/lotions.  Please wear clean clothes to the hospital/surgery center.   Remember to brush your teeth WITH YOUR REGULAR TOOTHPASTE.   Please read over the following fact sheets that you were given.

## 2018-11-18 ENCOUNTER — Other Ambulatory Visit: Payer: Self-pay

## 2018-11-18 ENCOUNTER — Encounter (HOSPITAL_COMMUNITY)
Admission: RE | Admit: 2018-11-18 | Discharge: 2018-11-18 | Disposition: A | Discharge status: Home / Self Care | Payer: Medicare Other | Source: Ambulatory Visit | Attending: Vascular Surgery | Admitting: Vascular Surgery

## 2018-11-18 ENCOUNTER — Encounter (HOSPITAL_COMMUNITY): Payer: Self-pay

## 2018-11-18 ENCOUNTER — Other Ambulatory Visit (HOSPITAL_COMMUNITY)
Admission: RE | Admit: 2018-11-18 | Discharge: 2018-11-18 | Disposition: A | Discharge status: Home / Self Care | Payer: Medicare Other | Source: Ambulatory Visit | Attending: Vascular Surgery | Admitting: Vascular Surgery

## 2018-11-18 DIAGNOSIS — I10 Essential (primary) hypertension: Secondary | ICD-10-CM | POA: Diagnosis not present

## 2018-11-18 DIAGNOSIS — Z1159 Encounter for screening for other viral diseases: Secondary | ICD-10-CM | POA: Insufficient documentation

## 2018-11-18 DIAGNOSIS — Z79891 Long term (current) use of opiate analgesic: Secondary | ICD-10-CM | POA: Diagnosis not present

## 2018-11-18 DIAGNOSIS — Z01812 Encounter for preprocedural laboratory examination: Secondary | ICD-10-CM | POA: Insufficient documentation

## 2018-11-18 DIAGNOSIS — I714 Abdominal aortic aneurysm, without rupture: Secondary | ICD-10-CM | POA: Diagnosis not present

## 2018-11-18 DIAGNOSIS — Z7982 Long term (current) use of aspirin: Secondary | ICD-10-CM | POA: Diagnosis not present

## 2018-11-18 DIAGNOSIS — I739 Peripheral vascular disease, unspecified: Secondary | ICD-10-CM | POA: Diagnosis not present

## 2018-11-18 DIAGNOSIS — E785 Hyperlipidemia, unspecified: Secondary | ICD-10-CM | POA: Diagnosis not present

## 2018-11-18 DIAGNOSIS — J984 Other disorders of lung: Secondary | ICD-10-CM | POA: Diagnosis not present

## 2018-11-18 LAB — CBC
HCT: 41.3 % (ref 39.0–52.0)
Hemoglobin: 13.9 g/dL (ref 13.0–17.0)
MCH: 32.5 pg (ref 26.0–34.0)
MCHC: 33.7 g/dL (ref 30.0–36.0)
MCV: 96.5 fL (ref 80.0–100.0)
Platelets: 237 10*3/uL (ref 150–400)
RBC: 4.28 MIL/uL (ref 4.22–5.81)
RDW: 12.6 % (ref 11.5–15.5)
WBC: 9.1 10*3/uL (ref 4.0–10.5)
nRBC: 0 % (ref 0.0–0.2)

## 2018-11-18 LAB — COMPREHENSIVE METABOLIC PANEL
ALT: 31 U/L (ref 0–44)
AST: 29 U/L (ref 15–41)
Albumin: 4.2 g/dL (ref 3.5–5.0)
Alkaline Phosphatase: 52 U/L (ref 38–126)
Anion gap: 12 (ref 5–15)
BUN: 17 mg/dL (ref 8–23)
CO2: 27 mmol/L (ref 22–32)
Calcium: 10.2 mg/dL (ref 8.9–10.3)
Chloride: 102 mmol/L (ref 98–111)
Creatinine, Ser: 0.97 mg/dL (ref 0.61–1.24)
GFR calc Af Amer: >60 mL/min (ref >60)
GFR calc non Af Amer: >60 mL/min (ref >60)
Glucose, Bld: 72 mg/dL (ref 70–99)
Potassium: 3.5 mmol/L (ref 3.5–5.1)
Sodium: 141 mmol/L (ref 135–145)
Total Bilirubin: 0.6 mg/dL (ref 0.3–1.2)
Total Protein: 7.9 g/dL (ref 6.5–8.1)

## 2018-11-18 LAB — SURGICAL PCR SCREEN
MRSA, PCR: NEGATIVE
Staphylococcus aureus: NEGATIVE

## 2018-11-18 LAB — URINALYSIS, ROUTINE W REFLEX MICROSCOPIC
Bilirubin Urine: NEGATIVE
Glucose, UA: NEGATIVE mg/dL
Hgb urine dipstick: NEGATIVE
Ketones, ur: NEGATIVE mg/dL
Leukocytes,Ua: NEGATIVE
Nitrite: NEGATIVE
Protein, ur: NEGATIVE mg/dL
Specific Gravity, Urine: 1.015 (ref 1.005–1.030)
pH: 6 (ref 5.0–8.0)

## 2018-11-18 LAB — TYPE AND SCREEN
ABO/RH(D): A POS
Antibody Screen: NEGATIVE

## 2018-11-18 LAB — PROTIME-INR
INR: 1.1 (ref 0.8–1.2)
Prothrombin Time: 14 seconds (ref 11.4–15.2)

## 2018-11-18 LAB — APTT: aPTT: 27 seconds (ref 24–36)

## 2018-11-18 NOTE — Progress Notes (Addendum)
PCP - Dr. Mikeal Hawthorne ([place]) Cardiologist - Patient denies  Chest x-ray - n/a EKG - 11/18/18 Stress Test - patient denies ECHO - patient denies Cardiac Cath - patient deneis  Sleep Study - patient denies CPAP -   Fasting Blood Sugar - n/a Checks Blood Sugar _____ times a day  Blood Thinner Instructions:n/a Aspirin Instructions: continue  Anesthesia review: yes, abnormal EKG  Patient denies shortness of breath, fever, cough and chest pain at PAT appointment   Coronavirus Screening  Have you experienced the following symptoms:  Cough yes/no: No Fever (>100.38F)  yes/no: No Runny nose yes/no: No Sore throat yes/no: No Difficulty breathing/shortness of breath  yes/no: No  Have you or a family member traveled in the last 14 days and where? yes/no: No   If the patient indicates "YES" to the above questions, their PAT will be rescheduled to limit the exposure to others and, the surgeon will be notified. THE PATIENT WILL NEED TO BE ASYMPTOMATIC FOR 14 DAYS.   If the patient is not experiencing any of these symptoms, the PAT nurse will instruct them to NOT bring anyone with them to their appointment since they may have these symptoms or traveled as well.   Please remind your patients and families that hospital visitation restrictions are in effect and the importance of the restrictions.    Patient verbalized understanding of instructions that were given to them at the PAT appointment. Patient was also instructed that they will need to review over the PAT instructions again at home before surgery.

## 2018-11-19 ENCOUNTER — Inpatient Hospital Stay (HOSPITAL_COMMUNITY): Payer: Medicare Other

## 2018-11-19 ENCOUNTER — Inpatient Hospital Stay (HOSPITAL_COMMUNITY)
Admission: RE | Admit: 2018-11-19 | Discharge: 2018-11-20 | DRG: 269 | Disposition: A | Discharge status: Home / Self Care | Payer: Medicare Other | Attending: Vascular Surgery | Admitting: Vascular Surgery

## 2018-11-19 ENCOUNTER — Other Ambulatory Visit: Payer: Self-pay

## 2018-11-19 ENCOUNTER — Encounter (HOSPITAL_COMMUNITY)
Admission: RE | Disposition: A | Discharge status: Home / Self Care | Payer: Self-pay | Source: Home / Self Care | Attending: Vascular Surgery

## 2018-11-19 ENCOUNTER — Encounter (HOSPITAL_COMMUNITY): Payer: Self-pay

## 2018-11-19 ENCOUNTER — Inpatient Hospital Stay (HOSPITAL_COMMUNITY): Payer: Medicare Other | Admitting: Physician Assistant

## 2018-11-19 ENCOUNTER — Other Ambulatory Visit (HOSPITAL_COMMUNITY): Payer: Self-pay | Admitting: *Deleted

## 2018-11-19 ENCOUNTER — Inpatient Hospital Stay (HOSPITAL_COMMUNITY): Payer: Medicare Other | Admitting: Certified Registered"

## 2018-11-19 DIAGNOSIS — I714 Abdominal aortic aneurysm, without rupture, unspecified: Secondary | ICD-10-CM | POA: Diagnosis present

## 2018-11-19 DIAGNOSIS — Z8679 Personal history of other diseases of the circulatory system: Secondary | ICD-10-CM

## 2018-11-19 DIAGNOSIS — I10 Essential (primary) hypertension: Secondary | ICD-10-CM | POA: Diagnosis present

## 2018-11-19 DIAGNOSIS — Z79891 Long term (current) use of opiate analgesic: Secondary | ICD-10-CM

## 2018-11-19 DIAGNOSIS — E785 Hyperlipidemia, unspecified: Secondary | ICD-10-CM | POA: Diagnosis present

## 2018-11-19 DIAGNOSIS — Z1159 Encounter for screening for other viral diseases: Secondary | ICD-10-CM

## 2018-11-19 DIAGNOSIS — Z7982 Long term (current) use of aspirin: Secondary | ICD-10-CM

## 2018-11-19 DIAGNOSIS — I739 Peripheral vascular disease, unspecified: Secondary | ICD-10-CM | POA: Diagnosis present

## 2018-11-19 DIAGNOSIS — Z95828 Presence of other vascular implants and grafts: Secondary | ICD-10-CM

## 2018-11-19 HISTORY — PX: ABDOMINAL AORTIC ENDOVASCULAR STENT GRAFT: SHX5707

## 2018-11-19 LAB — BLOOD GAS, ARTERIAL
Acid-Base Excess: 0.6 mmol/L (ref 0.0–2.0)
Bicarbonate: 24.6 mmol/L (ref 20.0–28.0)
O2 Saturation: 96.5 %
Patient temperature: 98.6
pCO2 arterial: 39.1 mmHg (ref 32.0–48.0)
pH, Arterial: 7.416 (ref 7.350–7.450)
pO2, Arterial: 97.3 mmHg (ref 83.0–108.0)

## 2018-11-19 LAB — CBC
HCT: 38.2 % — ABNORMAL LOW (ref 39.0–52.0)
Hemoglobin: 13.2 g/dL (ref 13.0–17.0)
MCH: 33 pg (ref 26.0–34.0)
MCHC: 34.6 g/dL (ref 30.0–36.0)
MCV: 95.5 fL (ref 80.0–100.0)
Platelets: 175 10*3/uL (ref 150–400)
RBC: 4 MIL/uL — ABNORMAL LOW (ref 4.22–5.81)
RDW: 12.4 % (ref 11.5–15.5)
WBC: 10.8 10*3/uL — ABNORMAL HIGH (ref 4.0–10.5)
nRBC: 0 % (ref 0.0–0.2)

## 2018-11-19 LAB — BASIC METABOLIC PANEL
Anion gap: 8 (ref 5–15)
BUN: 11 mg/dL (ref 8–23)
CO2: 25 mmol/L (ref 22–32)
Calcium: 9.3 mg/dL (ref 8.9–10.3)
Chloride: 105 mmol/L (ref 98–111)
Creatinine, Ser: 0.84 mg/dL (ref 0.61–1.24)
GFR calc Af Amer: >60 mL/min (ref >60)
GFR calc non Af Amer: >60 mL/min (ref >60)
Glucose, Bld: 151 mg/dL — ABNORMAL HIGH (ref 70–99)
Potassium: 3.7 mmol/L (ref 3.5–5.1)
Sodium: 138 mmol/L (ref 135–145)

## 2018-11-19 LAB — MAGNESIUM: Magnesium: 1.8 mg/dL (ref 1.7–2.4)

## 2018-11-19 LAB — POCT ACTIVATED CLOTTING TIME: Activated Clotting Time: 213 seconds

## 2018-11-19 LAB — SARS CORONAVIRUS 2: SARS Coronavirus 2: NOT DETECTED

## 2018-11-19 LAB — NOVEL CORONAVIRUS, NAA (HOSP ORDER, SEND-OUT TO REF LAB; TAT 18-24 HRS): SARS-CoV-2, NAA: NOT DETECTED

## 2018-11-19 LAB — APTT: aPTT: 33 seconds (ref 24–36)

## 2018-11-19 SURGERY — INSERTION, ENDOVASCULAR STENT GRAFT, AORTA, ABDOMINAL
Anesthesia: General | Site: Abdomen

## 2018-11-19 MED ORDER — METOPROLOL TARTRATE 5 MG/5ML IV SOLN: 2.0000 mg | INTRAVENOUS | Status: DC | PRN

## 2018-11-19 MED ORDER — ESMOLOL HCL 100 MG/10ML IV SOLN
INTRAVENOUS | Status: AC
  Filled 2018-11-19: qty 10

## 2018-11-19 MED ORDER — OXYCODONE-ACETAMINOPHEN 5-325 MG PO TABS: 1.0000 | ORAL_TABLET | ORAL | Status: DC | PRN

## 2018-11-19 MED ORDER — ROCURONIUM BROMIDE 100 MG/10ML IV SOLN
INTRAVENOUS | Status: DC | PRN
  Administered 2018-11-19: 40 mg via INTRAVENOUS
  Administered 2018-11-19: 60 mg via INTRAVENOUS

## 2018-11-19 MED ORDER — ONDANSETRON HCL 4 MG/2ML IJ SOLN: 4.0000 mg | Freq: Four times a day (QID) | INTRAMUSCULAR | Status: DC | PRN

## 2018-11-19 MED ORDER — SODIUM CHLORIDE 0.9 % IV SOLN
INTRAVENOUS | Status: DC | PRN
  Administered 2018-11-19: 50 ug/min via INTRAVENOUS

## 2018-11-19 MED ORDER — ROCURONIUM BROMIDE 10 MG/ML (PF) SYRINGE
PREFILLED_SYRINGE | INTRAVENOUS | Status: AC
  Filled 2018-11-19: qty 10

## 2018-11-19 MED ORDER — POTASSIUM CHLORIDE CRYS ER 20 MEQ PO TBCR: 20.0000 meq | EXTENDED_RELEASE_TABLET | Freq: Every day | ORAL | Status: DC | PRN

## 2018-11-19 MED ORDER — ACETAMINOPHEN 325 MG PO TABS: 325.0000 mg | ORAL_TABLET | ORAL | Status: DC | PRN

## 2018-11-19 MED ORDER — HYDRALAZINE HCL 20 MG/ML IJ SOLN
INTRAMUSCULAR | Status: AC
  Filled 2018-11-19: qty 1

## 2018-11-19 MED ORDER — FLUOCINONIDE 0.05 % EX CREA: 1.0000 "application " | TOPICAL_CREAM | Freq: Two times a day (BID) | CUTANEOUS | Status: DC

## 2018-11-19 MED ORDER — DEXAMETHASONE SODIUM PHOSPHATE 10 MG/ML IJ SOLN
INTRAMUSCULAR | Status: DC | PRN
  Administered 2018-11-19: 10 mg via INTRAVENOUS

## 2018-11-19 MED ORDER — LABETALOL HCL 5 MG/ML IV SOLN
10.0000 mg | INTRAVENOUS | Status: DC | PRN
  Administered 2018-11-19 (×3): 10 mg via INTRAVENOUS

## 2018-11-19 MED ORDER — HEPARIN SODIUM (PORCINE) 1000 UNIT/ML IJ SOLN
INTRAMUSCULAR | Status: AC
  Filled 2018-11-19: qty 1

## 2018-11-19 MED ORDER — BISACODYL 10 MG RE SUPP: 10.0000 mg | Freq: Every day | RECTAL | Status: DC | PRN

## 2018-11-19 MED ORDER — SUGAMMADEX SODIUM 200 MG/2ML IV SOLN
INTRAVENOUS | Status: DC | PRN
  Administered 2018-11-19: 180 mg via INTRAVENOUS

## 2018-11-19 MED ORDER — MIDAZOLAM HCL 5 MG/5ML IJ SOLN
INTRAMUSCULAR | Status: DC | PRN
  Administered 2018-11-19: 2 mg via INTRAVENOUS

## 2018-11-19 MED ORDER — SODIUM CHLORIDE 0.9 % IV SOLN: INTRAVENOUS | Status: DC

## 2018-11-19 MED ORDER — FENTANYL CITRATE (PF) 100 MCG/2ML IJ SOLN: 25.0000 ug | INTRAMUSCULAR | Status: DC | PRN

## 2018-11-19 MED ORDER — PROTAMINE SULFATE 10 MG/ML IV SOLN
INTRAVENOUS | Status: AC
  Filled 2018-11-19: qty 5

## 2018-11-19 MED ORDER — ONDANSETRON HCL 4 MG/2ML IJ SOLN: 4.0000 mg | Freq: Once | INTRAMUSCULAR | Status: DC | PRN

## 2018-11-19 MED ORDER — LIDOCAINE 2% (20 MG/ML) 5 ML SYRINGE
INTRAMUSCULAR | Status: AC
  Filled 2018-11-19: qty 5

## 2018-11-19 MED ORDER — HEPARIN SODIUM (PORCINE) 1000 UNIT/ML IJ SOLN
INTRAMUSCULAR | Status: DC | PRN
  Administered 2018-11-19: 3000 [IU] via INTRAVENOUS
  Administered 2018-11-19: 8000 [IU] via INTRAVENOUS

## 2018-11-19 MED ORDER — PROPOFOL 10 MG/ML IV BOLUS
INTRAVENOUS | Status: AC
  Filled 2018-11-19: qty 20

## 2018-11-19 MED ORDER — MORPHINE SULFATE (PF) 2 MG/ML IV SOLN: 2.0000 mg | INTRAVENOUS | Status: DC | PRN

## 2018-11-19 MED ORDER — ALUM & MAG HYDROXIDE-SIMETH 200-200-20 MG/5ML PO SUSP: 15.0000 mL | ORAL | Status: DC | PRN

## 2018-11-19 MED ORDER — SODIUM CHLORIDE 0.9 % IV SOLN
INTRAVENOUS | Status: AC
  Filled 2018-11-19: qty 1.2

## 2018-11-19 MED ORDER — SODIUM CHLORIDE 0.9 % IV SOLN
INTRAVENOUS | Status: DC | PRN
  Administered 2018-11-19: 500 mL

## 2018-11-19 MED ORDER — MEPERIDINE HCL 25 MG/ML IJ SOLN: 6.2500 mg | INTRAMUSCULAR | Status: DC | PRN

## 2018-11-19 MED ORDER — PROPOFOL 10 MG/ML IV BOLUS
INTRAVENOUS | Status: DC | PRN
  Administered 2018-11-19: 150 mg via INTRAVENOUS
  Administered 2018-11-19: 50 mg via INTRAVENOUS

## 2018-11-19 MED ORDER — LACTATED RINGERS IV SOLN
INTRAVENOUS | Status: DC
  Administered 2018-11-19: 09:00:00 via INTRAVENOUS

## 2018-11-19 MED ORDER — ONDANSETRON HCL 4 MG/2ML IJ SOLN
INTRAMUSCULAR | Status: DC | PRN
  Administered 2018-11-19: 4 mg via INTRAVENOUS

## 2018-11-19 MED ORDER — LABETALOL HCL 5 MG/ML IV SOLN
INTRAVENOUS | Status: AC
  Filled 2018-11-19: qty 4

## 2018-11-19 MED ORDER — ASPIRIN EC 81 MG PO TBEC
81.0000 mg | DELAYED_RELEASE_TABLET | Freq: Every day | ORAL | Status: DC
  Administered 2018-11-20: 81 mg via ORAL
  Filled 2018-11-19: qty 1

## 2018-11-19 MED ORDER — CEFAZOLIN SODIUM-DEXTROSE 2-4 GM/100ML-% IV SOLN
2.0000 g | Freq: Three times a day (TID) | INTRAVENOUS | Status: AC
  Administered 2018-11-20 (×2): 2 g via INTRAVENOUS
  Filled 2018-11-19 (×3): qty 100

## 2018-11-19 MED ORDER — EPHEDRINE SULFATE-NACL 50-0.9 MG/10ML-% IV SOSY
PREFILLED_SYRINGE | INTRAVENOUS | Status: DC | PRN
  Administered 2018-11-19: 10 mg via INTRAVENOUS
  Administered 2018-11-19: 5 mg via INTRAVENOUS

## 2018-11-19 MED ORDER — ADULT MULTIVITAMIN W/MINERALS CH
1.0000 | ORAL_TABLET | Freq: Every day | ORAL | Status: DC
  Administered 2018-11-20: 1 via ORAL
  Filled 2018-11-19 (×2): qty 1

## 2018-11-19 MED ORDER — HYDRALAZINE HCL 20 MG/ML IJ SOLN
10.0000 mg | Freq: Once | INTRAMUSCULAR | Status: AC
  Administered 2018-11-19: 10 mg via INTRAVENOUS

## 2018-11-19 MED ORDER — PHENOL 1.4 % MT LIQD: 1.0000 | OROMUCOSAL | Status: DC | PRN

## 2018-11-19 MED ORDER — SODIUM CHLORIDE 0.9 % IV SOLN: 500.0000 mL | Freq: Once | INTRAVENOUS | Status: DC | PRN

## 2018-11-19 MED ORDER — DOCUSATE SODIUM 100 MG PO CAPS
100.0000 mg | ORAL_CAPSULE | Freq: Every day | ORAL | Status: DC
  Filled 2018-11-19: qty 1

## 2018-11-19 MED ORDER — MAGNESIUM SULFATE 2 GM/50ML IV SOLN: 2.0000 g | Freq: Every day | INTRAVENOUS | Status: DC | PRN

## 2018-11-19 MED ORDER — 0.9 % SODIUM CHLORIDE (POUR BTL) OPTIME
TOPICAL | Status: DC | PRN
  Administered 2018-11-19: 1000 mL

## 2018-11-19 MED ORDER — HYDRALAZINE HCL 20 MG/ML IJ SOLN: 5.0000 mg | INTRAMUSCULAR | Status: DC | PRN

## 2018-11-19 MED ORDER — CHLORHEXIDINE GLUCONATE CLOTH 2 % EX PADS: 6.0000 | MEDICATED_PAD | Freq: Once | CUTANEOUS | Status: DC

## 2018-11-19 MED ORDER — CEFAZOLIN SODIUM-DEXTROSE 2-4 GM/100ML-% IV SOLN
2.0000 g | INTRAVENOUS | Status: AC
  Administered 2018-11-19: 2 g via INTRAVENOUS
  Filled 2018-11-19: qty 100

## 2018-11-19 MED ORDER — PANTOPRAZOLE SODIUM 40 MG PO TBEC
40.0000 mg | DELAYED_RELEASE_TABLET | Freq: Every day | ORAL | Status: DC
  Administered 2018-11-20: 40 mg via ORAL
  Filled 2018-11-19: qty 1

## 2018-11-19 MED ORDER — FENTANYL CITRATE (PF) 250 MCG/5ML IJ SOLN
INTRAMUSCULAR | Status: AC
  Filled 2018-11-19: qty 5

## 2018-11-19 MED ORDER — ACETAMINOPHEN 325 MG RE SUPP: 325.0000 mg | RECTAL | Status: DC | PRN

## 2018-11-19 MED ORDER — IODIXANOL 320 MG/ML IV SOLN
INTRAVENOUS | Status: DC | PRN
  Administered 2018-11-19: 55.4 mL

## 2018-11-19 MED ORDER — LIDOCAINE HCL (CARDIAC) PF 100 MG/5ML IV SOSY
PREFILLED_SYRINGE | INTRAVENOUS | Status: DC | PRN
  Administered 2018-11-19: 100 mg via INTRATRACHEAL

## 2018-11-19 MED ORDER — FENTANYL CITRATE (PF) 250 MCG/5ML IJ SOLN
INTRAMUSCULAR | Status: DC | PRN
  Administered 2018-11-19: 100 ug via INTRAVENOUS
  Administered 2018-11-19: 50 ug via INTRAVENOUS

## 2018-11-19 MED ORDER — GUAIFENESIN-DM 100-10 MG/5ML PO SYRP: 15.0000 mL | ORAL_SOLUTION | ORAL | Status: DC | PRN

## 2018-11-19 MED ORDER — PROTAMINE SULFATE 10 MG/ML IV SOLN
INTRAVENOUS | Status: DC | PRN
  Administered 2018-11-19: 50 mg via INTRAVENOUS

## 2018-11-19 MED ORDER — POLYETHYLENE GLYCOL 3350 17 G PO PACK: 17.0000 g | PACK | Freq: Every day | ORAL | Status: DC | PRN

## 2018-11-19 SURGICAL SUPPLY — 56 items
BLADE CLIPPER SURG (BLADE) ×2 IMPLANT
CANISTER SUCT 3000ML PPV (MISCELLANEOUS) ×2 IMPLANT
CATH BEACON 5.038 65CM KMP-01 (CATHETERS) ×2 IMPLANT
CATH OMNI FLUSH .035X70CM (CATHETERS) ×2 IMPLANT
CLIP VESOCCLUDE MED 6/CT (CLIP) ×2 IMPLANT
CLIP VESOCCLUDE SM WIDE 6/CT (CLIP) ×2 IMPLANT
COVER WAND RF STERILE (DRAPES) ×2 IMPLANT
DERMABOND ADVANCED (GAUZE/BANDAGES/DRESSINGS) ×1
DERMABOND ADVANCED .7 DNX12 (GAUZE/BANDAGES/DRESSINGS) ×1 IMPLANT
DEVICE CLOSURE PERCLS PRGLD 6F (VASCULAR PRODUCTS) ×4 IMPLANT
DEVICE TORQUE KENDALL .025-038 (MISCELLANEOUS) IMPLANT
DRSG TEGADERM 2-3/8X2-3/4 SM (GAUZE/BANDAGES/DRESSINGS) ×4 IMPLANT
DRYSEAL FLEXSHEATH 12FR 33CM (SHEATH) ×1
DRYSEAL FLEXSHEATH 18FR 33CM (SHEATH) ×1
ELECT REM PT RETURN 9FT ADLT (ELECTROSURGICAL) ×4
ELECTRODE REM PT RTRN 9FT ADLT (ELECTROSURGICAL) ×2 IMPLANT
EXCLUDER TNK LEG 31MX14X17 (Endovascular Graft) ×1 IMPLANT
EXCLUDER TRUNK LEG 31MX14X17 (Endovascular Graft) ×2 IMPLANT
GAUZE SPONGE 2X2 8PLY STRL LF (GAUZE/BANDAGES/DRESSINGS) ×2 IMPLANT
GLOVE BIO SURGEON STRL SZ7.5 (GLOVE) ×2 IMPLANT
GOWN STRL REUS W/ TWL LRG LVL3 (GOWN DISPOSABLE) ×2 IMPLANT
GOWN STRL REUS W/ TWL XL LVL3 (GOWN DISPOSABLE) ×2 IMPLANT
GOWN STRL REUS W/TWL LRG LVL3 (GOWN DISPOSABLE) ×2
GOWN STRL REUS W/TWL XL LVL3 (GOWN DISPOSABLE) ×2
GRAFT BALLN CATH 65CM (STENTS) ×1 IMPLANT
GUIDEWIRE ANGLED .035X150CM (WIRE) ×2 IMPLANT
KIT BASIN OR (CUSTOM PROCEDURE TRAY) ×2 IMPLANT
KIT TURNOVER KIT B (KITS) ×2 IMPLANT
LEG CONTRALATERAL 18X11.5 (Endovascular Graft) ×2 IMPLANT
NEEDLE PERC 18GX7CM (NEEDLE) IMPLANT
NS IRRIG 1000ML POUR BTL (IV SOLUTION) ×2 IMPLANT
PACK ENDOVASCULAR (PACKS) ×2 IMPLANT
PAD ARMBOARD 7.5X6 YLW CONV (MISCELLANEOUS) ×4 IMPLANT
PERCLOSE PROGLIDE 6F (VASCULAR PRODUCTS) ×8
SET MICROPUNCTURE 5F STIFF (MISCELLANEOUS) ×2 IMPLANT
SHEATH BRITE TIP 8FR 23CM (SHEATH) ×2 IMPLANT
SHEATH DRYSEAL FLEX 12FR 33CM (SHEATH) ×1 IMPLANT
SHEATH DRYSEAL FLEX 18FR 33CM (SHEATH) ×1 IMPLANT
SHEATH PINNACLE 8F 10CM (SHEATH) ×2 IMPLANT
SPONGE GAUZE 2X2 STER 10/PKG (GAUZE/BANDAGES/DRESSINGS) ×2
STENT GRAFT BALLN CATH 65CM (STENTS) ×1
STOPCOCK MORSE 400PSI 3WAY (MISCELLANEOUS) ×2 IMPLANT
SUT MNCRL AB 4-0 PS2 18 (SUTURE) ×2 IMPLANT
SUT PROLENE 5 0 C 1 24 (SUTURE) ×2 IMPLANT
SUT VIC AB 2-0 CT1 27 (SUTURE)
SUT VIC AB 2-0 CT1 TAPERPNT 27 (SUTURE) IMPLANT
SUT VIC AB 3-0 SH 27 (SUTURE)
SUT VIC AB 3-0 SH 27X BRD (SUTURE) IMPLANT
SYR 20CC LL (SYRINGE) ×2 IMPLANT
SYRINGE 20CC LL (MISCELLANEOUS) ×2 IMPLANT
TOWEL GREEN STERILE (TOWEL DISPOSABLE) ×2 IMPLANT
TRAY FOLEY MTR SLVR 16FR STAT (SET/KITS/TRAYS/PACK) ×2 IMPLANT
TUBING HIGH PRESSURE 120CM (CONNECTOR) ×2 IMPLANT
WIRE AMPLATZ SS-J .035X180CM (WIRE) ×4 IMPLANT
WIRE BENTSON .035X145CM (WIRE) ×4 IMPLANT
WIRE TORQFLEX AUST .018X40CM (WIRE) ×2 IMPLANT

## 2018-11-19 NOTE — Anesthesia Procedure Notes (Signed)
Arterial Line Insertion Start/End6/04/2019 9:30 AM, 11/19/2018 9:40 AM Performed by: Kyung Rudd, CRNA  Patient location: Pre-op. Preanesthetic checklist: patient identified, IV checked, site marked, risks and benefits discussed, surgical consent, monitors and equipment checked, pre-op evaluation and timeout performed Lidocaine 1% used for infiltration Right, radial was placed Catheter size: 20 G Hand hygiene performed , maximum sterile barriers used  and Seldinger technique used Allen's test indicative of satisfactory collateral circulation Attempts: 1 Procedure performed without using ultrasound guided technique. Following insertion, Biopatch and dressing applied. Post procedure assessment: normal  Patient tolerated the procedure well with no immediate complications.

## 2018-11-19 NOTE — Progress Notes (Signed)
Pt hypertensive . BP=179/8mmHg. Did not take his BP meds this morning. MD aware.Will continue to monitor.

## 2018-11-19 NOTE — Progress Notes (Addendum)
Pt arrived from PACU to 4E14. CHG bath done, tele applied and verified x2. VSS w/ SBPs high 150s-160s, was given labetalol in PACU shortly before arrival. Aline pressures reading better, SBP 140s. No c/o pain. Neuro intact. Groin sites CDI, pulses dopplerable. Aware of keeping legs straight until 2100. Oriented to room and call light. Will continue to monitor.  Jaymes Graff, RN

## 2018-11-19 NOTE — Anesthesia Preprocedure Evaluation (Signed)
Anesthesia Evaluation  Patient identified by MRN, date of birth, ID band Patient awake    Reviewed: Allergy & Precautions, NPO status , Patient's Chart, lab work & pertinent test results  Airway Mallampati: II  TM Distance: >3 FB Neck ROM: Full    Dental  (+) Dental Advisory Given, Teeth Intact   Pulmonary neg pulmonary ROS, former smoker,    Pulmonary exam normal breath sounds clear to auscultation       Cardiovascular hypertension, + Peripheral Vascular Disease  Normal cardiovascular exam Rhythm:Regular Rate:Normal     Neuro/Psych PSYCHIATRIC DISORDERS negative neurological ROS     GI/Hepatic negative GI ROS, Neg liver ROS,   Endo/Other  negative endocrine ROS  Renal/GU negative Renal ROS     Musculoskeletal  (+) Arthritis ,   Abdominal   Peds  Hematology negative hematology ROS (+)   Anesthesia Other Findings   Reproductive/Obstetrics negative OB ROS                             Anesthesia Physical Anesthesia Plan  ASA: III  Anesthesia Plan: General   Post-op Pain Management:    Induction: Intravenous  PONV Risk Score and Plan: 3 and Ondansetron, Dexamethasone and Treatment may vary due to age or medical condition  Airway Management Planned: Oral ETT  Additional Equipment: Arterial line  Intra-op Plan:   Post-operative Plan: Extubation in OR  Informed Consent: I have reviewed the patients History and Physical, chart, labs and discussed the procedure including the risks, benefits and alternatives for the proposed anesthesia with the patient or authorized representative who has indicated his/her understanding and acceptance.     Dental advisory given  Plan Discussed with: CRNA  Anesthesia Plan Comments:         Anesthesia Quick Evaluation

## 2018-11-19 NOTE — H&P (Signed)
   History and Physical Update  The patient was interviewed and re-examined.  The patient's previous History and Physical has been reviewed and is unchanged from previous office visit. Plan for EVAR today.  Zyere Jiminez C. Donzetta Matters, MD [facility] Office: [phone] Pager: [phone]   11/19/2018, 9:37 AM

## 2018-11-19 NOTE — Transfer of Care (Signed)
Immediate Anesthesia Transfer of Care Note  Patient: Samuel [name]  Procedure(s) Performed: ABDOMINAL AORTIC ENDOVASCULAR STENT GRAFT (N/A Abdomen)  Patient Location: PACU  Anesthesia Type:General  Level of Consciousness: awake and patient cooperative  Airway & Oxygen Therapy: Patient Spontanous Breathing  Post-op Assessment: Report given to RN and Post -op Vital signs reviewed and stable  Post vital signs: Reviewed and stable  Last Vitals:  Vitals Value Taken Time  BP 161/61 11/19/18 1803  Temp 36.4 C 11/19/18 1718  Pulse 64 11/19/18 1814  Resp 15 11/19/18 1814  SpO2 97 % 11/19/18 1814  Vitals shown include unvalidated device data.  Last Pain:  Vitals:   11/19/18 1745  TempSrc:   PainSc: 0-No pain      Patients Stated Pain Goal: 0 (73/56/70 1410)  Complications: No apparent anesthesia complications

## 2018-11-19 NOTE — Discharge Instructions (Signed)
  [facility]   Discharge Instructions  Endovascular Aortic Aneurysm Repair  Please refer to the following instructions for your post-procedure care. Your surgeon or Physician Assistant will discuss any changes with you.  Activity  You are encouraged to walk as much as you can. You can slowly return to normal activities but must avoid strenuous activity and heavy lifting until your doctor tells you it's OK. Avoid activities such as vacuuming or swinging a gold club. It is normal to feel tired for several weeks after your surgery. Do not drive until your doctor gives the OK and you are no longer taking prescription pain medications. It is also normal to have difficulty with sleep habits, eating, and bowel movements after surgery. These will go away with time.  Bathing/Showering  Shower daily after you go home.  Do not soak in a bathtub, hot tub, or swim until the incision heals completely.  If you have incisions in your groin, wash the groin wounds with soap and water daily and pat dry. (No tub bath-only shower)  Then put a dry gauze or washcloth there to keep this area dry to help prevent wound infection daily and as needed.  Do not use Vaseline or neosporin on your incisions.  Only use soap and water on your incisions and then protect and keep dry.  Incision Care  Shower every day. Clean your incision with mild soap and water. Pat the area dry with a clean towel. You do not need a bandage unless otherwise instructed. Do not apply any ointments or creams to your incision. If you clothing is irritating, you may cover your incision with a dry gauze pad.  Diet  Resume your normal diet. There are no special food restrictions following this procedure. A low fat/low cholesterol diet is recommended for all patients with vascular disease. In order to heal from your surgery, it is CRITICAL to get adequate nutrition. Your body requires vitamins, minerals, and protein.  Vegetables are the best source of vitamins and minerals. Vegetables also provide the perfect balance of protein. Processed food has little nutritional value, so try to avoid this.  Medications  Resume taking all of your medications unless your doctor or nurse practitioner tells you not to. If your incision is causing pain, you may take over-the-counter pain relievers such as acetaminophen (Tylenol). If you were prescribed a stronger pain medication, please be aware these medications can cause nausea and constipation. Prevent nausea by taking the medication with a snack or meal. Avoid constipation by drinking plenty of fluids and eating foods with a high amount of fiber, such as fruits, vegetables, and grains.  Do not take Tylenol if you are taking prescription pain medications.   Follow up  Our office will schedule a follow-up appointment with a CT scan 3-4 weeks after your surgery.  Please call us immediately for any of the following conditions  Severe or worsening pain in your legs or feet or in your abdomen back or chest. Increased pain, redness, drainage (pus) from your incision site. Increased abdominal pain, bloating, nausea, vomiting or persistent diarrhea. Fever of 101 degrees or higher. Swelling in your leg (s),  Reduce your risk of vascular disease  Stop smoking. If you would like help call [facility] at 1-800-QUIT-NOW (1-800-784-8669) or [place] at 336-586-4000. Manage your cholesterol Maintain a desired weight Control your diabetes Keep your blood pressure down  If you have questions, please call the office at [phone].  

## 2018-11-19 NOTE — Anesthesia Procedure Notes (Signed)
Procedure Name: Intubation Date/Time: 11/19/2018 4:45 PM Performed by: Lance Coon, CRNA Pre-anesthesia Checklist: Patient identified, Emergency Drugs available, Suction available, Patient being monitored and Timeout performed Patient Re-evaluated:Patient Re-evaluated prior to induction Oxygen Delivery Method: Circle system utilized Preoxygenation: Pre-oxygenation with 100% oxygen Induction Type: IV induction Ventilation: Mask ventilation without difficulty Laryngoscope Size: Miller and 3 Grade View: Grade II Tube type: Oral Tube size: 7.5 mm Number of attempts: 1 Airway Equipment and Method: Stylet Placement Confirmation: ETT inserted through vocal cords under direct vision,  positive ETCO2 and breath sounds checked- equal and bilateral Secured at: 22 cm Tube secured with: Tape Dental Injury: Teeth and Oropharynx as per pre-operative assessment

## 2018-11-19 NOTE — Op Note (Signed)
° ° °  Patient name: Samuel [name] MRN: [id number] DOB: 1945-12-22 Sex: male  11/19/2018 Pre-operative Diagnosis: Abdominal aortic aneurysm Post-operative diagnosis:  Same Surgeon:  Erlene Quan C. Donzetta Matters, MD Assistant: Marty Heck, MD Procedure Performed: 1.  Bilateral percutaneous access and closure common femoral arteries 2.  Aortobiiliac endograft doing with main body left 31 x 14 x 17 cm with right limb 18 x 11.5 cm  Indications: 73 year old male with expanding abdominal aortic aneurysm now greater than 5 cm.  Appears to be a candidate for percutaneous endo-grafting.  We have discussed the risk benefits alternatives and he agrees to proceed.  Findings: There was a long neck bilateral renal arteries were patent.  Right hypogastric artery is dominant left is quite diminutive   Procedure:  The patient was identified in the holding area and taken to the operating room where is placed supine operative table.  General anesthesia was induced and he was given antibiotics.  He sterilely prepped and draped in the abdomen bilateral lower extremities in the usual fashion timeout was called.  We began using ultrasound guidance to cannulate common femoral artery first on the right we placed micropuncture needle followed the wire and sheath.  Tract was dilated with 8 French sheath and 2 pro-glide devices were deployed.  We placed an 8 [place] sheath.  Similarly on the left side we's ultrasound to cannulate soft part of the common femoral artery placed a micropuncture sheath followed by Bentson wire deployed to pro-glide devices and a [place] sheath was placed.  We exchanged on both sides for Amplatz wires placed a 18 French sheath on the left patient was fully heparinized we then placed a 12 French sheath on the right.  The main body was brought in through the left side.  Omni catheter from the right and aortogram performed.  We identified the bilateral renal arteries deployed our graft to the gate.  We  retracted our 12 French sheath on the right cannulated our gate.  Omni catheter was placed we were able to twirl this within the graft confirmed access.  We replaced Amplatz wire shot a retrograde angiogram from the right sheath.  We then deployed 18 x 11-1/2 cm limb in the right side.  The left side we performed an RAO retrograde angiogram to identify our diminutive left hypogastric artery.  We then completed our endograft to this level.  We balloon the entire graft from both sides.  Completion angiogram demonstrated no 1 or 3 endoleak's with a possible late type II.  We then first removed our small sheath on the right deployed our pro-glide devices which deployed well.  Similarly did the same on the left.  Wires were all removed.  50 mg of protamine were given.  We then removed our sutures from the groin closed both with 4 Monocryl Dermabond placed to level skin.  He was then awakened anesthesia having tolerated procedure without immediate complication.  EBL: 50 cc  Contrast: 55 cc   Shanece Cochrane C. Donzetta Matters, MD [facility] [place] Office: [phone] Pager: [phone]

## 2018-11-20 ENCOUNTER — Encounter (HOSPITAL_COMMUNITY): Payer: Self-pay | Admitting: Vascular Surgery

## 2018-11-20 LAB — BASIC METABOLIC PANEL
Anion gap: 10 (ref 5–15)
BUN: 10 mg/dL (ref 8–23)
CO2: 22 mmol/L (ref 22–32)
Calcium: 9.3 mg/dL (ref 8.9–10.3)
Chloride: 104 mmol/L (ref 98–111)
Creatinine, Ser: 0.95 mg/dL (ref 0.61–1.24)
GFR calc Af Amer: >60 mL/min (ref >60)
GFR calc non Af Amer: >60 mL/min (ref >60)
Glucose, Bld: 181 mg/dL — ABNORMAL HIGH (ref 70–99)
Potassium: 3.9 mmol/L (ref 3.5–5.1)
Sodium: 136 mmol/L (ref 135–145)

## 2018-11-20 LAB — CBC
HCT: 36.9 % — ABNORMAL LOW (ref 39.0–52.0)
Hemoglobin: 12.7 g/dL — ABNORMAL LOW (ref 13.0–17.0)
MCH: 33.1 pg (ref 26.0–34.0)
MCHC: 34.4 g/dL (ref 30.0–36.0)
MCV: 96.1 fL (ref 80.0–100.0)
Platelets: 192 10*3/uL (ref 150–400)
RBC: 3.84 MIL/uL — ABNORMAL LOW (ref 4.22–5.81)
RDW: 12.4 % (ref 11.5–15.5)
WBC: 11.2 10*3/uL — ABNORMAL HIGH (ref 4.0–10.5)
nRBC: 0 % (ref 0.0–0.2)

## 2018-11-20 LAB — PROTIME-INR
INR: 1.1 (ref 0.8–1.2)
Prothrombin Time: 14.2 seconds (ref 11.4–15.2)

## 2018-11-20 MED ORDER — OXYCODONE-ACETAMINOPHEN 5-325 MG PO TABS: 1.0000 | ORAL_TABLET | Freq: Four times a day (QID) | ORAL | 0 refills | Status: DC | PRN

## 2018-11-20 NOTE — Anesthesia Postprocedure Evaluation (Signed)
Anesthesia Post Note  Patient: Samuel [name]  Procedure(s) Performed: ABDOMINAL AORTIC ENDOVASCULAR STENT GRAFT (N/A Abdomen)     Patient location during evaluation: PACU Anesthesia Type: General Level of consciousness: sedated and patient cooperative Pain management: pain level controlled Vital Signs Assessment: post-procedure vital signs reviewed and stable Respiratory status: spontaneous breathing Cardiovascular status: stable Anesthetic complications: no    Last Vitals:  Vitals:   11/20/18 0011 11/20/18 0315  BP: 139/64 (!) 145/60  Pulse: 73 72  Resp: 15 15  Temp: 36.6 C 36.6 C  SpO2: 100% 96%    Last Pain:  Vitals:   11/20/18 0315  TempSrc: Oral  PainSc:                  Nolon Nations

## 2018-11-20 NOTE — Progress Notes (Addendum)
Patient discharged.  AVS given with education provided to patient.  All questions addressed with medication and appointments explained.  Wife updated this am and notified of patient discharge.  Peripheral IV removed, vital signs obtained and were stable.  Foley catheter removed early this am by night shift Colletta Maryland, Therapist, sports). Pt has voided post Foley removal, walked, and eaten breakfast.  Pt discharged by wheelchair to the [facility] to be received by wife.

## 2018-11-20 NOTE — Discharge Summary (Signed)
[facility] Discharge Summary   Samuel [name] 03/08/1946 73 y.o. male  MRN: [id number]  Admission Date: 11/19/2018  Discharge Date: 11/20/2018  Physician: Thomes Lolling*  Admission Diagnosis: ABDOMINAL AORTIC ANEURYSM   HPI:   This is a 73 y.o. male with with no abdominal aortic aneurysm.  This did grow greater than 1 cm within the year.  Wound was measured by CT it was less than 5-1/2 cm although with the growth greater than a year by duplex we discussed repair.  Patient is very nervous about traveling which she has scheduled coming up this summer would like to have this repaired.  I reviewed his CT scan while talking with him.  Hospital Course:  The patient was admitted to the hospital and taken to the operating room on 11/19/2018 and underwent: 1.  Bilateral percutaneous access and closure common femoral arteries 2.  Aortobiiliac endograft doing with main body left 31 x 14 x 17 cm with right limb 18 x 11.5 cm    Findings: Findings: There was a long neck bilateral renal arteries were patent.  Right hypogastric artery is dominant left is quite diminutive  The pt tolerated the procedure well and was transported to the PACU in good condition.   Pt is doing well post operatively.  He is able to void.  He has doppler signals right foot and palpable left DP pulse. Pain is controlled and ambulating without difficulty.    The remainder of the hospital course consisted of increasing mobilization and increasing intake of solids without difficulty.  CBC    Component Value Date/Time   WBC 11.2 (H) 11/20/2018 0414   RBC 3.84 (L) 11/20/2018 0414   HGB 12.7 (L) 11/20/2018 0414   HCT 36.9 (L) 11/20/2018 0414   PLT 192 11/20/2018 0414   MCV 96.1 11/20/2018 0414   MCH 33.1 11/20/2018 0414   MCHC 34.4 11/20/2018 0414   RDW 12.4 11/20/2018 0414   LYMPHSABS 2.2 06/16/2015 1004   MONOABS 0.9 06/16/2015 1004   EOSABS 0.4 06/16/2015 1004   BASOSABS 0.0 06/16/2015 1004    BMET    Component Value Date/Time   NA 136 11/20/2018 0414   K 3.9 11/20/2018 0414   CL 104 11/20/2018 0414   CO2 22 11/20/2018 0414   GLUCOSE 181 (H) 11/20/2018 0414   BUN 10 11/20/2018 0414   CREATININE 0.95 11/20/2018 0414   CALCIUM 9.3 11/20/2018 0414   GFRNONAA >60 11/20/2018 0414   GFRAA >60 11/20/2018 0414         Discharge Diagnosis:  ABDOMINAL AORTIC ANEURYSM  Secondary Diagnosis: Patient Active Problem List   Diagnosis Date Noted  . AAA (abdominal aortic aneurysm) ([place]) 11/19/2018  . History of gout 10/03/2017  . Pain of toe of right foot 10/03/2017  . Aftercare following surgery of the circulatory system, [place] 03/02/2013  . Proximal muscle weakness 09/10/2012  . Occlusion and stenosis of carotid artery without mention of cerebral infarction 02/27/2012  . Abdominal aneurysm without mention of rupture 02/27/2012  . Acute gouty arthritis 09/27/2010  . DIABETES MELLITUS, TYPE II, BORDERLINE 04/17/2009  . CONDYLOMA ACUMINATUM 10/16/2007  . Hyperlipidemia 10/16/2007  . ALCOHOL USE 10/16/2007  . Essential hypertension 10/16/2007  . Peripheral vascular disease ([place]) 10/16/2007  . CAROTID BRUIT, RIGHT 10/16/2007   Past Medical History:  Diagnosis Date  . AAA (abdominal aortic aneurysm) ([place])   . Carotid artery occlusion   . Genital warts    history of  . History of carotid stenosis  s/p right carotid endarterectomy 12/06  . Hyperlipidemia   . Hypertension   . PAD (peripheral artery disease) ([facility])    Left leg     Allergies as of 11/20/2018      Reactions   Lipitor [atorvastatin] Other (See Comments)   Muscle pain   Livalo [pitavastatin] Other (See Comments)   Muscle aches   Statins    Muscle aches      Medication List    TAKE these medications   aspirin 81 MG tablet Take 81 mg by mouth daily.   colchicine 0.6 MG tablet Take 1.2 mg today then 0.6 mg 1 hour later.  Then take 1 tablet daily for 5 days.   fluocinonide cream 0.05 % Commonly known as:  LIDEX Apply 1 application topically 2 (two) times daily.   lisinopril-hydrochlorothiazide 10-12.5 MG tablet Commonly known as: ZESTORETIC TAKE 1 TABLET BY MOUTH DAILY.   multivitamin tablet Take 1 tablet by mouth daily.   oxyCODONE-acetaminophen 5-325 MG tablet Commonly known as: PERCOCET/ROXICET Take 1 tablet by mouth every 6 (six) hours as needed for moderate pain.   sildenafil 25 MG tablet Commonly known as: VIAGRA Take 25 mg by mouth daily as needed for erectile dysfunction.   Vitamin D 50 MCG (2000 UT) Caps Take one to two caps daily       Discharge Instructions:  [facility]  Discharge Instructions Endovascular Aortic Aneurysm Repair  Please refer to the following instructions for your post-procedure care. Your surgeon or Physician Assistant will discuss any changes with you.  Activity  You are encouraged to walk as much as you can. You can slowly return to normal activities but must avoid strenuous activity and heavy lifting until your doctor tells you it's OK. Avoid activities such as vacuuming or swinging a gold club. It is normal to feel tired for several weeks after your surgery. Do not drive until your doctor gives the OK and you are no longer taking prescription pain medications. It is also normal to have difficulty with sleep habits, eating, and bowel movements after surgery. These will go away with time.  Bathing/Showering  You may shower after you go home. If you have an incision, do not soak in a bathtub, hot tub, or swim until the incision heals completely.  Incision Care  Shower every day. Clean your incision with mild soap and water. Pat the area dry with a clean towel. You do not need a bandage unless otherwise instructed. Do not apply any ointments or creams to your incision. If you clothing is irritating, you may cover your incision with a dry gauze pad.  Diet  Resume your normal diet. There are no special food  restrictions following this procedure. A low fat/low cholesterol diet is recommended for all patients with vascular disease. In order to heal from your surgery, it is CRITICAL to get adequate nutrition. Your body requires vitamins, minerals, and protein. Vegetables are the best source of vitamins and minerals. Vegetables also provide the perfect balance of protein. Processed food has little nutritional value, so try to avoid this.  Medications  Resume taking all of your medications unless your doctor or Physician Assistnat tells you not to. If your incision is causing pain, you may take over-the-counter pain relievers such as acetaminophen (Tylenol). If you were prescribed a stronger pain medication, please be aware these medications can cause nausea and constipation. Prevent nausea by taking the medication with a snack or meal. Avoid constipation by drinking plenty  of fluids and eating foods with a high amount of fiber, such as fruits, vegetables, and grains.  Do not take Tylenol if you are taking prescription pain medications.   Follow up  [place] office will schedule a follow-up appointment with a C.T. scan 3-4 weeks after your surgery.  Please call us immediately for any of the following conditions  . Severe or worsening pain in your legs or feet or in your abdomen back or chest. . Increased pain, redness, drainage (pus) from your incision sit. . Increased abdominal pain, bloating, nausea, vomiting or persistent diarrhea. . Fever of 101 degrees or higher. . Swelling in your leg (s), .  Reduce your risk of vascular disease  .Stop smoking. If you would like help call [facility] at 1-800-QUIT-NOW 7401725941) or [place] at ([phone]. .Manage your cholesterol .Maintain a desired weight .Control your diabetes .Keep your blood pressure down  If you have questions, please call the office at [phone].    Prescriptions given: 1.  Roxicet #10 No Refill  Disposition: home   Patient's condition: is Good  Follow up: 1. Dr. Donzetta Matters in 4 weeks with CTA protocol   Leontine Locket, PA-C [facility] [phone] 11/20/2018  7:26 AM   - For VQI Registry use - Post-op:  Time to Extubation: [x]  In OR, [ ]  < 12 hrs, [ ]  12-24 hrs, [ ]  >=24 hrs Vasopressors Req. Post-op: No MI: No., [ ]  Troponin only, [ ]  EKG or Clinical New Arrhythmia: No CHF: No ICU Stay: 1 day in stepdown Transfusion: No     If yes, n/a units given  Complications: Resp failure: No., [ ]  Pneumonia, [ ]  Ventilator Chg in renal function: No., [ ]  Inc. Cr > 0.5, [ ]  Temp. Dialysis,  [ ]  Permanent dialysis Leg ischemia: No., no Surgery needed, [ ]  Yes, Surgery needed,  [ ]  Amputation Bowel ischemia: No., [ ]  Medical Rx, [ ]  Surgical Rx Wound complication: No., [ ]  Superficial separation/infection, [ ]  Return to OR Return to OR: No  Return to OR for bleeding: No Stroke: No., [ ]  Minor, [ ]  Major  Discharge medications: Statin use:  No-allergy ASA use:  Yes  Plavix use:  No  Beta blocker use:  No  ARB use:  No ACEI use:  Yes CCB use:  No

## 2018-11-20 NOTE — Progress Notes (Addendum)
  Progress Note    11/20/2018 7:22 AM 1 Day Post-Op  Subjective:  "I feel terrific"  Afebrile HR 60's-70's NSR 559'R-416'L systolic 845% RA  Vitals:   11/20/18 0011 11/20/18 0315  BP: 139/64 (!) 145/60  Pulse: 73 72  Resp: 15 15  Temp: 97.9 F (36.6 C) 97.9 F (36.6 C)  SpO2: 100% 96%    Physical Exam: Cardiac:  regular Lungs:  Non labored Incisions:  Bilateral groins are soft without hematoma Extremities:  Palpable left DP and doppler signals right foot. Abdomen:  Soft, NT/ND  CBC    Component Value Date/Time   WBC 11.2 (H) 11/20/2018 0414   RBC 3.84 (L) 11/20/2018 0414   HGB 12.7 (L) 11/20/2018 0414   HCT 36.9 (L) 11/20/2018 0414   PLT 192 11/20/2018 0414   MCV 96.1 11/20/2018 0414   MCH 33.1 11/20/2018 0414   MCHC 34.4 11/20/2018 0414   RDW 12.4 11/20/2018 0414   LYMPHSABS 2.2 06/16/2015 1004   MONOABS 0.9 06/16/2015 1004   EOSABS 0.4 06/16/2015 1004   BASOSABS 0.0 06/16/2015 1004    BMET    Component Value Date/Time   NA 136 11/20/2018 0414   K 3.9 11/20/2018 0414   CL 104 11/20/2018 0414   CO2 22 11/20/2018 0414   GLUCOSE 181 (H) 11/20/2018 0414   BUN 10 11/20/2018 0414   CREATININE 0.95 11/20/2018 0414   CALCIUM 9.3 11/20/2018 0414   GFRNONAA >60 11/20/2018 0414   GFRAA >60 11/20/2018 0414    INR    Component Value Date/Time   INR 1.1 11/20/2018 0414     Intake/Output Summary (Last 24 hours) at 11/20/2018 0722 Last data filed at 11/20/2018 3646 Gross per 24 hour  Intake 1335 ml  Output 1400 ml  Net -65 ml     Assessment:  73 y.o. male is s/p:  1.  Bilateral percutaneous access and closure common femoral arteries 2.  Aortobiiliac endograft doing with main body left 31 x 14 x 17 cm with right limb 18 x 11.5 cm  1 Day Post-Op  Plan: -pt doing well this am with palpable left DP and doppler signals right foot.   -foley out at 4am-has voided a small amount. -needs to ambulate in hallway -discharge home later this am & f/u with  Dr. Donzetta Matters in 4 weeks with CTA -home on asa.  No statin due to allergy   Leontine Locket, PA-C [facility] [phone] 11/20/2018 7:22 AM  I have independently interviewed and examined patient and agree with PA assessment and plan above.   Brandon C. Donzetta Matters, MD [facility] [place] Office: [phone] Pager: [phone]

## 2018-11-24 ENCOUNTER — Other Ambulatory Visit: Payer: Self-pay

## 2018-11-24 DIAGNOSIS — I714 Abdominal aortic aneurysm, without rupture, unspecified: Secondary | ICD-10-CM

## 2018-11-25 ENCOUNTER — Other Ambulatory Visit: Payer: Self-pay

## 2018-11-25 ENCOUNTER — Encounter (HOSPITAL_COMMUNITY): Payer: Self-pay | Admitting: Emergency Medicine

## 2018-11-25 ENCOUNTER — Ambulatory Visit (HOSPITAL_COMMUNITY)
Admission: EM | Admit: 2018-11-25 | Discharge: 2018-11-25 | Disposition: A | Discharge status: Home / Self Care | Payer: Medicare Other | Attending: Family Medicine | Admitting: Family Medicine

## 2018-11-25 DIAGNOSIS — I10 Essential (primary) hypertension: Secondary | ICD-10-CM | POA: Diagnosis not present

## 2018-11-25 DIAGNOSIS — M109 Gout, unspecified: Secondary | ICD-10-CM

## 2018-11-25 MED ORDER — MICONAZOLE NITRATE 2 % EX POWD: CUTANEOUS | 0 refills | Status: DC | PRN

## 2018-11-25 NOTE — ED Provider Notes (Signed)
[facility]    CSN: [id number] Arrival date & time: 11/25/18  1005     History   Chief Complaint Chief Complaint  Patient presents with  . Foot Pain    HPI Samuel [name] is a 73 y.o. male.   Pt is a 73 year old male with past medical history of AAA, hypertension, PAD,, hyperlipidemia, gout, DM 2, alcohol use.  Recent abdominal aortic endovascular stent graft.  He presents today with right foot pain.  There is swelling and redness.  This is located at the great toe and underneath the plantar aspect of foot and remaining toes.  This started a few days ago.  He also noticed a rash.  The area is mildly itchy.  Denies any injuries to the foot.  Patient does have history of gout.  He took 1 diclofenac which did not seem to help much.  No problems with ambulation.  Denies any numbness or tingling.  ROS per HPI      Past Medical History:  Diagnosis Date  . AAA (abdominal aortic aneurysm) ([place])   . Carotid artery occlusion   . Genital warts    history of  . History of carotid stenosis    s/p right carotid endarterectomy 12/06  . Hyperlipidemia   . Hypertension   . PAD (peripheral artery disease) ([facility])    Left leg    Patient Active Problem List   Diagnosis Date Noted  . AAA (abdominal aortic aneurysm) ([place]) 11/19/2018  . History of gout 10/03/2017  . Pain of toe of right foot 10/03/2017  . Aftercare following surgery of the circulatory system, [place] 03/02/2013  . Proximal muscle weakness 09/10/2012  . Occlusion and stenosis of carotid artery without mention of cerebral infarction 02/27/2012  . Abdominal aneurysm without mention of rupture 02/27/2012  . Acute gouty arthritis 09/27/2010  . DIABETES MELLITUS, TYPE II, BORDERLINE 04/17/2009  . CONDYLOMA ACUMINATUM 10/16/2007  . Hyperlipidemia 10/16/2007  . ALCOHOL USE 10/16/2007  . Essential hypertension 10/16/2007  . Peripheral vascular disease ([place]) 10/16/2007  . CAROTID BRUIT, RIGHT 10/16/2007    Past  Surgical History:  Procedure Laterality Date  . ABDOMINAL AORTIC ENDOVASCULAR STENT GRAFT N/A 11/19/2018   Procedure: ABDOMINAL AORTIC ENDOVASCULAR STENT GRAFT;  Surgeon: Waynetta Sandy, MD;  Location: [facility];  Service: Vascular;  Laterality: N/A;  . [place]  . CAROTID ENDARTERECTOMY  05/2005   right  . COLONOSCOPY    . [place]  . Left Leg stents  2012       Home Medications    Prior to Admission medications   Medication Sig Start Date End Date Taking? Authorizing Provider  aspirin 81 MG tablet Take 81 mg by mouth daily.    [provider]  Cholecalciferol (VITAMIN D) 2000 UNITS CAPS Take one to two caps daily Patient not taking: Reported on 11/16/2018 09/01/13   Shawna Orleans, Doe-Hyun R, DO  colchicine 0.6 MG tablet Take 1.2 mg today then 0.6 mg 1 hour later.  Then take 1 tablet daily for 5 days. Patient not taking: Reported on 11/16/2018 10/03/17   Horald Pollen, MD  fluocinonide cream (LIDEX) 0.92 % Apply 1 application topically 2 (two) times daily.    [provider]  lisinopril-hydrochlorothiazide (PRINZIDE,ZESTORETIC) 10-12.5 MG tablet TAKE 1 TABLET BY MOUTH DAILY. Patient taking differently: Take 1 tablet by mouth daily.  08/17/15   Shawna Orleans, Doe-Hyun R, DO  miconazole (MICOTIN) 2 % powder Apply topically as needed for itching.  11/25/18   Loura Halt A, NP  Multiple Vitamin (MULTIVITAMIN) tablet Take 1 tablet by mouth daily.    [provider]  oxyCODONE-acetaminophen (PERCOCET/ROXICET) 5-325 MG tablet Take 1 tablet by mouth every 6 (six) hours as needed for moderate pain. 11/20/18   Rhyne, Hulen Shouts, PA-C  sildenafil (VIAGRA) 25 MG tablet Take 25 mg by mouth daily as needed for erectile dysfunction.    [provider]    Family History Family History  Problem Relation Age of Onset  . Stroke Mother        deceased age 61 had her firsrt CVA age  69  . Hyperlipidemia Mother   . Peripheral vascular disease Father         status post leg amputation  secondary to PVD  . Hyperlipidemia Father   . Peripheral vascular disease Brother   . Colon cancer Neg Hx   . Esophageal cancer Neg Hx   . Prostate cancer Neg Hx   . Rectal cancer Neg Hx   . Stomach cancer Neg Hx     Social History Social History   Tobacco Use  . Smoking status: Former Research scientist (life sciences)  . Smokeless tobacco: Never Used  . Tobacco comment: quit- for porlonged periods he has smoked 2-3 packs a day  Substance Use Topics  . Alcohol use: Yes    Alcohol/week: 24.0 standard drinks    Types: 24 Cans of beer per week  . Drug use: No     Allergies   Lipitor [atorvastatin], Livalo [pitavastatin], and Statins   Review of Systems Review of Systems   Physical Exam Triage Vital Signs ED Triage Vitals [11/25/18 1025]  Enc Vitals Group     BP (!) 150/75     Pulse Rate 76     Resp 18     Temp 98.6 F (37 C)     Temp Source Oral     SpO2 98 %     Weight      Height      Head Circumference      Peak Flow      Pain Score      Pain Loc      Pain Edu?      Excl. in [place]?    No data found.  Updated Vital Signs BP (!) 150/75 (BP Location: Right Arm)   Pulse 76   Temp 98.6 F (37 C) (Oral)   Resp 18   SpO2 98%   Visual Acuity Right Eye Distance:   Left Eye Distance:   Bilateral Distance:    Right Eye Near:   Left Eye Near:    Bilateral Near:     Physical Exam Vitals signs and nursing note reviewed.  Constitutional:      Appearance: Normal appearance.  HENT:     Head: Normocephalic and atraumatic.     Nose: Nose normal.  Eyes:     Conjunctiva/sclera: Conjunctivae normal.  Neck:     Musculoskeletal: Normal range of motion.  Pulmonary:     Effort: Pulmonary effort is normal.  Musculoskeletal: Normal range of motion.        General: Swelling and tenderness present.  Skin:    General: Skin is warm and dry.     Findings: Rash present.     Comments: See picture for detail   Neurological:     Mental Status: He is alert.   Psychiatric:        Mood and Affect: Mood normal.  UC Treatments / Results  Labs (all labs ordered are listed, but only abnormal results are displayed) Labs Reviewed - No data to display  EKG None  Radiology No results found.  Procedures Procedures (including critical care time)  Medications Ordered in UC Medications - No data to display  Initial Impression / Assessment and Plan / UC Course  I have reviewed the triage vital signs and the nursing notes.  Pertinent labs & imaging results that were available during my care of the patient were reviewed by me and considered in my medical decision making (see chart for details).     Treating patient for gout flare and possible fungal infection We will have him continue his diclofenac and colchicine that he has at home for Gout.  Miconazole powder sent to pharmacy for fungus Recommended if symptoms continue or worsen he will need to follow-up. Final Clinical Impressions(s) / UC Diagnoses   Final diagnoses:  Acute gout of right foot, unspecified cause     Discharge Instructions     Ongoing this is most likely a gout flare.  You can continue the diclofenac and take the colchicine for gout flare I am also going to prescribe some miconazole powder to prevent fungus infection Follow up as needed for continued or worsening symptoms     ED Prescriptions    Medication Sig Dispense Auth. Provider   miconazole (MICOTIN) 2 % powder Apply topically as needed for itching. 70 g Orvan July, NP     Controlled Substance Prescriptions [place] Controlled Substance Registry consulted? no   Orvan July, NP 11/27/18 [phone]

## 2018-11-25 NOTE — ED Triage Notes (Signed)
Pt sts right foot pain with rash under toes per pt; pt sts recent cath with stents but doing well from that

## 2018-11-25 NOTE — Discharge Instructions (Signed)
Ongoing this is most likely a gout flare.  You can continue the diclofenac and take the colchicine for gout flare I am also going to prescribe some miconazole powder to prevent fungus infection Follow up as needed for continued or worsening symptoms

## 2018-11-27 ENCOUNTER — Ambulatory Visit (HOSPITAL_COMMUNITY): Payer: Self-pay

## 2018-11-27 DIAGNOSIS — I714 Abdominal aortic aneurysm, without rupture, unspecified: Secondary | ICD-10-CM

## 2018-12-18 ENCOUNTER — Other Ambulatory Visit: Payer: Medicare Other

## 2018-12-22 ENCOUNTER — Other Ambulatory Visit: Payer: Self-pay

## 2018-12-22 ENCOUNTER — Ambulatory Visit
Admission: RE | Admit: 2018-12-22 | Discharge: 2018-12-22 | Disposition: A | Discharge status: Home / Self Care | Payer: Medicare Other | Source: Ambulatory Visit | Attending: Vascular Surgery | Admitting: Vascular Surgery

## 2018-12-22 DIAGNOSIS — I714 Abdominal aortic aneurysm, without rupture, unspecified: Secondary | ICD-10-CM

## 2018-12-22 MED ORDER — IOPAMIDOL (ISOVUE-370) INJECTION 76%
75.0000 mL | Freq: Once | INTRAVENOUS | Status: AC | PRN
  Administered 2018-12-22: 75 mL via INTRAVENOUS

## 2018-12-24 ENCOUNTER — Telehealth (HOSPITAL_COMMUNITY): Payer: Self-pay | Admitting: Rehabilitation

## 2018-12-24 NOTE — Telephone Encounter (Signed)

## 2018-12-25 ENCOUNTER — Other Ambulatory Visit: Payer: Self-pay

## 2018-12-25 ENCOUNTER — Ambulatory Visit (INDEPENDENT_AMBULATORY_CARE_PROVIDER_SITE_OTHER): Payer: Self-pay | Admitting: Vascular Surgery

## 2018-12-25 ENCOUNTER — Encounter: Payer: Self-pay | Admitting: Vascular Surgery

## 2018-12-25 VITALS — BP 159/75 | HR 70 | Temp 97.8°F | Resp 20 | Ht 70.0 in | Wt 177.9 lb

## 2018-12-25 DIAGNOSIS — I714 Abdominal aortic aneurysm, without rupture, unspecified: Secondary | ICD-10-CM

## 2018-12-25 NOTE — Progress Notes (Signed)
    Subjective:     Patient ID: Samuel [name], male   DOB: [birth year], 73 y.o.   MRN: [id number]  HPI 73 year old male follows up after endovascular aneurysm repair.  Since that time he had gout subsequent upset stomach and is lost significant weight.  This is all resolved he is doing well now.  No new abdominal or back pain.  No lower extremity pain at this time.   Review of Systems   Great toe pain, diarrhea Objective:   Physical Exam Vitals:   12/25/18 1528  BP: (!) 159/75  Pulse: 70  Resp: 20  Temp: 97.8 F (36.6 C)  SpO2: 96%  Awake alert oriented Delayed respirations Abdomen is soft Palpable bilateral common femoral pulses    CT angio IMPRESSION: VASCULAR  1. Interval bifurcated infrarenal aortic stent graft placement without endoleak. Native sac diameter 5.4 cm. 2. Visceral and renal arterial origin stenoses as above.     Assessment:     73 year old male status post endovascular aneurysm repair doing well    Plan:     Follow-up in 1 year with endovascular aneurysm repair duplex.    Kanaan Kagawa C. Donzetta Matters, MD [facility] Office: [phone] Pager: [phone]

## 2019-06-17 ENCOUNTER — Telehealth: Payer: Self-pay

## 2019-06-17 NOTE — Telephone Encounter (Signed)
Returned pt's call requesting that Dr. Donzetta Matters view his cd that he brought to the office the other day. Ensured him that we have it and will give Dr. Donzetta Matters it tomorrow when he is in office. Pt given an appt with PA for tomorrow afternoon. No further questions at this time.

## 2019-06-18 ENCOUNTER — Other Ambulatory Visit: Payer: Self-pay

## 2019-06-18 ENCOUNTER — Other Ambulatory Visit (HOSPITAL_COMMUNITY)
Admission: RE | Admit: 2019-06-18 | Discharge: 2019-06-18 | Disposition: A | Discharge status: Home / Self Care | Payer: Medicare Other | Source: Ambulatory Visit | Attending: Vascular Surgery | Admitting: Vascular Surgery

## 2019-06-18 DIAGNOSIS — Z20822 Contact with and (suspected) exposure to covid-19: Secondary | ICD-10-CM | POA: Diagnosis not present

## 2019-06-18 DIAGNOSIS — Z01812 Encounter for preprocedural laboratory examination: Secondary | ICD-10-CM | POA: Diagnosis not present

## 2019-06-18 LAB — SARS CORONAVIRUS 2 (TAT 6-24 HRS): SARS Coronavirus 2: NEGATIVE

## 2019-06-21 ENCOUNTER — Other Ambulatory Visit: Payer: Self-pay

## 2019-06-21 ENCOUNTER — Encounter (HOSPITAL_COMMUNITY)
Admission: RE | Disposition: A | Discharge status: Home / Self Care | Payer: Self-pay | Source: Home / Self Care | Attending: Vascular Surgery

## 2019-06-21 ENCOUNTER — Ambulatory Visit (HOSPITAL_COMMUNITY)
Admission: RE | Admit: 2019-06-21 | Discharge: 2019-06-21 | Disposition: A | Discharge status: Home / Self Care | Payer: Medicare Other | Attending: Vascular Surgery | Admitting: Vascular Surgery

## 2019-06-21 DIAGNOSIS — Z79899 Other long term (current) drug therapy: Secondary | ICD-10-CM | POA: Diagnosis not present

## 2019-06-21 DIAGNOSIS — I739 Peripheral vascular disease, unspecified: Secondary | ICD-10-CM | POA: Insufficient documentation

## 2019-06-21 DIAGNOSIS — E785 Hyperlipidemia, unspecified: Secondary | ICD-10-CM | POA: Diagnosis not present

## 2019-06-21 DIAGNOSIS — Z87891 Personal history of nicotine dependence: Secondary | ICD-10-CM | POA: Diagnosis not present

## 2019-06-21 DIAGNOSIS — I10 Essential (primary) hypertension: Secondary | ICD-10-CM | POA: Insufficient documentation

## 2019-06-21 DIAGNOSIS — K551 Chronic vascular disorders of intestine: Secondary | ICD-10-CM | POA: Insufficient documentation

## 2019-06-21 DIAGNOSIS — Z7982 Long term (current) use of aspirin: Secondary | ICD-10-CM | POA: Insufficient documentation

## 2019-06-21 HISTORY — PX: VISCERAL ARTERY INTERVENTION: CATH118277

## 2019-06-21 HISTORY — PX: VISCERAL ANGIOGRAPHY: CATH118276

## 2019-06-21 LAB — POCT I-STAT, CHEM 8
BUN: 10 mg/dL (ref 8–23)
Calcium, Ion: 1.28 mmol/L (ref 1.15–1.40)
Chloride: 97 mmol/L — ABNORMAL LOW (ref 98–111)
Creatinine, Ser: 0.6 mg/dL — ABNORMAL LOW (ref 0.61–1.24)
Glucose, Bld: 92 mg/dL (ref 70–99)
HCT: 37 % — ABNORMAL LOW (ref 39.0–52.0)
Hemoglobin: 12.6 g/dL — ABNORMAL LOW (ref 13.0–17.0)
Potassium: 3.8 mmol/L (ref 3.5–5.1)
Sodium: 137 mmol/L (ref 135–145)
TCO2: 29 mmol/L (ref 22–32)

## 2019-06-21 SURGERY — VISCERAL ANGIOGRAPHY
Anesthesia: LOCAL

## 2019-06-21 MED ORDER — MORPHINE SULFATE (PF) 2 MG/ML IV SOLN: 2.0000 mg | INTRAVENOUS | Status: DC | PRN

## 2019-06-21 MED ORDER — MIDAZOLAM HCL 2 MG/2ML IJ SOLN
INTRAMUSCULAR | Status: AC
  Filled 2019-06-21: qty 2

## 2019-06-21 MED ORDER — HEPARIN (PORCINE) IN NACL 1000-0.9 UT/500ML-% IV SOLN
INTRAVENOUS | Status: AC
  Filled 2019-06-21: qty 1000

## 2019-06-21 MED ORDER — HEPARIN SODIUM (PORCINE) 1000 UNIT/ML IJ SOLN
INTRAMUSCULAR | Status: DC | PRN
  Administered 2019-06-21: 5000 [IU] via INTRAVENOUS
  Administered 2019-06-21: 3000 [IU] via INTRAVENOUS

## 2019-06-21 MED ORDER — LIDOCAINE HCL (PF) 1 % IJ SOLN
INTRAMUSCULAR | Status: DC | PRN
  Administered 2019-06-21: 15 mL via INTRADERMAL

## 2019-06-21 MED ORDER — SODIUM CHLORIDE 0.9 % WEIGHT BASED INFUSION: 1.0000 mL/kg/h | INTRAVENOUS | Status: DC

## 2019-06-21 MED ORDER — FENTANYL CITRATE (PF) 100 MCG/2ML IJ SOLN
INTRAMUSCULAR | Status: DC | PRN
  Administered 2019-06-21: 50 ug via INTRAVENOUS

## 2019-06-21 MED ORDER — CLOPIDOGREL BISULFATE 300 MG PO TABS
ORAL_TABLET | ORAL | Status: AC
  Filled 2019-06-21: qty 1

## 2019-06-21 MED ORDER — SODIUM CHLORIDE 0.9% FLUSH: 3.0000 mL | INTRAVENOUS | Status: DC | PRN

## 2019-06-21 MED ORDER — HYDRALAZINE HCL 20 MG/ML IJ SOLN: 5.0000 mg | INTRAMUSCULAR | Status: DC | PRN

## 2019-06-21 MED ORDER — MIDAZOLAM HCL 2 MG/2ML IJ SOLN
INTRAMUSCULAR | Status: DC | PRN
  Administered 2019-06-21: 1 mg via INTRAVENOUS

## 2019-06-21 MED ORDER — ONDANSETRON HCL 4 MG/2ML IJ SOLN: 4.0000 mg | Freq: Four times a day (QID) | INTRAMUSCULAR | Status: DC | PRN

## 2019-06-21 MED ORDER — FENTANYL CITRATE (PF) 100 MCG/2ML IJ SOLN
INTRAMUSCULAR | Status: AC
  Filled 2019-06-21: qty 2

## 2019-06-21 MED ORDER — LIDOCAINE HCL (PF) 1 % IJ SOLN
INTRAMUSCULAR | Status: AC
  Filled 2019-06-21: qty 30

## 2019-06-21 MED ORDER — CLOPIDOGREL BISULFATE 75 MG PO TABS: 75.0000 mg | ORAL_TABLET | Freq: Every day | ORAL | 11 refills | Status: DC

## 2019-06-21 MED ORDER — OXYCODONE HCL 5 MG PO TABS: 5.0000 mg | ORAL_TABLET | ORAL | Status: DC | PRN

## 2019-06-21 MED ORDER — CLOPIDOGREL BISULFATE 75 MG PO TABS: 75.0000 mg | ORAL_TABLET | Freq: Every day | ORAL | Status: DC

## 2019-06-21 MED ORDER — SODIUM CHLORIDE 0.9 % IV SOLN: INTRAVENOUS | Status: DC

## 2019-06-21 MED ORDER — HEPARIN (PORCINE) IN NACL 1000-0.9 UT/500ML-% IV SOLN
INTRAVENOUS | Status: DC | PRN
  Administered 2019-06-21 (×2): 500 mL

## 2019-06-21 MED ORDER — SODIUM CHLORIDE 0.9% FLUSH: 3.0000 mL | Freq: Two times a day (BID) | INTRAVENOUS | Status: DC

## 2019-06-21 MED ORDER — LABETALOL HCL 5 MG/ML IV SOLN: 10.0000 mg | INTRAVENOUS | Status: DC | PRN

## 2019-06-21 MED ORDER — HEPARIN SODIUM (PORCINE) 1000 UNIT/ML IJ SOLN
INTRAMUSCULAR | Status: AC
  Filled 2019-06-21: qty 1

## 2019-06-21 MED ORDER — ACETAMINOPHEN 325 MG PO TABS: 650.0000 mg | ORAL_TABLET | ORAL | Status: DC | PRN

## 2019-06-21 MED ORDER — CLOPIDOGREL BISULFATE 75 MG PO TABS: 300.0000 mg | ORAL_TABLET | Freq: Once | ORAL | Status: DC

## 2019-06-21 MED ORDER — CLOPIDOGREL BISULFATE 300 MG PO TABS
ORAL_TABLET | ORAL | Status: DC | PRN
  Administered 2019-06-21: 300 mg via ORAL

## 2019-06-21 MED ORDER — SODIUM CHLORIDE 0.9 % IV SOLN: 250.0000 mL | INTRAVENOUS | Status: DC | PRN

## 2019-06-21 MED ORDER — IODIXANOL 320 MG/ML IV SOLN
INTRAVENOUS | Status: DC | PRN
  Administered 2019-06-21: 15:00:00 115 mL via INTRA_ARTERIAL

## 2019-06-21 SURGICAL SUPPLY — 28 items
BALLN STERLING OTW 4X20X135 (BALLOONS) ×2
BALLOON STERLING OTW 4X20X135 (BALLOONS) ×1 IMPLANT
CATH CXI 2.6F 65 ST (CATHETERS) ×1
CATH CXI 2.6F 90 ANG (CATHETERS) ×1
CATH NAVICROSS ANG 65CM (CATHETERS) ×1 IMPLANT
CATH OMNI FLUSH 5F 65CM (CATHETERS) ×2 IMPLANT
CATH SPRT ANG 90X2.3FR ACPT (CATHETERS) ×1 IMPLANT
CATH SPRT STRG 65X2.6FR ACPT (CATHETERS) ×1 IMPLANT
CATHETER NAVICROSS ANG 65CM (CATHETERS) ×2
CLOSURE MYNX CONTROL 6F/7F (Vascular Products) ×2 IMPLANT
DEVICE TORQUE .025-.038 (MISCELLANEOUS) ×2 IMPLANT
GUIDE CATH VISTA IMA 6F (CATHETERS) ×2 IMPLANT
GUIDEWIRE ANGLED .035X150CM (WIRE) ×2 IMPLANT
KIT ENCORE 26 ADVANTAGE (KITS) ×2 IMPLANT
KIT ESSENTIALS PG (KITS) ×2 IMPLANT
KIT MICROPUNCTURE NIT STIFF (SHEATH) ×2 IMPLANT
KIT PV (KITS) ×2 IMPLANT
SHEATH PINNACLE 6F 10CM (SHEATH) ×2 IMPLANT
SHEATH PROBE COVER 6X72 (BAG) ×2 IMPLANT
STENT HERCULINK RX 5.0X18X135 (Permanent Stent) ×2 IMPLANT
STOPCOCK MORSE 400PSI 3WAY (MISCELLANEOUS) ×2 IMPLANT
SYR MEDRAD MARK V 150ML (SYRINGE) ×2 IMPLANT
TRANSDUCER W/STOPCOCK (MISCELLANEOUS) ×2 IMPLANT
TRAY PV CATH (CUSTOM PROCEDURE TRAY) ×2 IMPLANT
TUBING CIL FLEX 10 FLL-RA (TUBING) ×2 IMPLANT
WIRE BENTSON .035X145CM (WIRE) ×2 IMPLANT
WIRE G V18X300CM (WIRE) ×2 IMPLANT
WIRE SPARTACORE .014X190CM (WIRE) ×2 IMPLANT

## 2019-06-21 NOTE — H&P (Signed)
HP  History of Present Illness: This is a 74 y.o. male history endovascular aneurysm repair.  Now has abdominal pain with significant weight loss over the past 3 to 4 months.  He was found to have SMA tight stenosis versus occlusion which is chronic by CT scan but also does have it appear of acute component by symptoms.  He is now here for angiography.  Past Medical History:  Diagnosis Date  . AAA (abdominal aortic aneurysm) ([place])   . Carotid artery occlusion   . Genital warts    history of  . History of carotid stenosis    s/p right carotid endarterectomy 12/06  . Hyperlipidemia   . Hypertension   . PAD (peripheral artery disease) ([facility])    Left leg    Past Surgical History:  Procedure Laterality Date  . ABDOMINAL AORTIC ENDOVASCULAR STENT GRAFT N/A 11/19/2018   Procedure: ABDOMINAL AORTIC ENDOVASCULAR STENT GRAFT;  Surgeon: Waynetta Sandy, MD;  Location: [facility];  Service: Vascular;  Laterality: N/A;  . [place]  . CAROTID ENDARTERECTOMY  05/2005   right  . COLONOSCOPY    . [place]  . Left Leg stents  2012    Allergies  Allergen Reactions  . Diclofenac Other (See Comments)    diarrhea  . Lipitor [Atorvastatin] Other (See Comments)    Muscle pain  . Livalo [Pitavastatin] Other (See Comments)    Muscle aches  . Statins     Muscle aches    Prior to Admission medications   Medication Sig Start Date End Date Taking? Authorizing Provider  aspirin EC 81 MG tablet Take 81 mg by mouth daily after breakfast.   Yes [provider]  Cholecalciferol (VITAMIN D3) 125 MCG (5000 UT) CHEW Chew 10,000 Units by mouth daily after breakfast.   Yes [provider]  lisinopril-hydrochlorothiazide (PRINZIDE,ZESTORETIC) 10-12.5 MG tablet TAKE 1 TABLET BY MOUTH DAILY. Patient taking differently: Take 1 tablet by mouth daily after breakfast.  08/17/15  Yes Yoo, Doe-Hyun R, DO  Multiple Vitamin (MULTIVITAMIN WITH MINERALS) TABS tablet Take 1 tablet  by mouth daily after breakfast.   Yes [provider]  sildenafil (VIAGRA) 25 MG tablet Take 25 mg by mouth daily as needed for erectile dysfunction.   Yes [provider]    Social History   Socioeconomic History  . Marital status: Married    Spouse name: Not on file  . Number of children: Not on file  . Years of education: Not on file  . Highest education level: Not on file  Occupational History    Employer: RETIRED    Comment: self employed - [employer]  Tobacco Use  . Smoking status: Former Research scientist (life sciences)  . Smokeless tobacco: Never Used  . Tobacco comment: quit- for porlonged periods he has smoked 2-3 packs a day  Substance and Sexual Activity  . Alcohol use: Yes    Alcohol/week: 24.0 standard drinks    Types: 24 Cans of beer per week  . Drug use: No  . Sexual activity: Not on file  Other Topics Concern  . Not on file  Social History Narrative   Married -to his second wife Idamae Schuller).  Has a daughter from his first marriage who is in her early 60s.     He drinks approximately 4 alcoholic beverages, beer, a day.  He has done so since age 47.  Denies any binge drinking or heavy alcohol use.  Quit tobacco 2 year ago.  He has  smoked on and off x20-30 years.  For prolonged periods he has smoked 2-3 packs a day.     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  She lives in [place], [place]   Social Determinants of Health   Financial Resource Strain:   . Difficulty of Paying Living Expenses: Not on file  Food Insecurity:   . Worried About Charity fundraiser in the Last Year: Not on file  . Ran Out of Food in the Last Year: Not on file  Transportation Needs:   . Lack of Transportation (Medical): Not on file  . Lack of Transportation (Non-Medical): Not on file  Physical Activity:   . Days of Exercise per Week: Not on file  . Minutes of Exercise per Session: Not on file  Stress:   . Feeling of Stress : Not on file  Social  Connections:   . Frequency of Communication with Friends and Family: Not on file  . Frequency of Social Gatherings with Friends and Family: Not on file  . Attends Religious Services: Not on file  . Active Member of Clubs or Organizations: Not on file  . Attends Archivist Meetings: Not on file  . Marital Status: Not on file  Intimate Partner Violence:   . Fear of Current or Ex-Partner: Not on file  . Emotionally Abused: Not on file  . Physically Abused: Not on file  . Sexually Abused: Not on file    Family History  Problem Relation Age of Onset  . Stroke Mother        deceased age 3 had her firsrt CVA age  72  . Hyperlipidemia Mother   . Peripheral vascular disease Father        status post leg amputation  secondary to PVD  . Hyperlipidemia Father   . Peripheral vascular disease Brother   . Colon cancer Neg Hx   . Esophageal cancer Neg Hx   . Prostate cancer Neg Hx   . Rectal cancer Neg Hx   . Stomach cancer Neg Hx     ROS: Abdominal pain with weight loss  Physical Examination  Vitals:   06/21/19 1009  BP: (!) 149/76  Pulse: 71  Resp: 17  Temp: (!) 97.2 F (36.2 C)  SpO2: 100%   Body mass index is 20.81 kg/m.  General: No acute distress, has lost weight since last evaluation HENT: WNL, normocephalic Pulmonary: normal non-labored breathing, without Rales, rhonchi,  wheezing Cardiac: Common femoral pulses are palpable Abdomen: Soft nontender nondistended Extremities: Warm and well-perfused  CBC    Component Value Date/Time   WBC 11.2 (H) 11/20/2018 0414   RBC 3.84 (L) 11/20/2018 0414   HGB 12.6 (L) 06/21/2019 1034   HCT 37.0 (L) 06/21/2019 1034   PLT 192 11/20/2018 0414   MCV 96.1 11/20/2018 0414   MCH 33.1 11/20/2018 0414   MCHC 34.4 11/20/2018 0414   RDW 12.4 11/20/2018 0414   LYMPHSABS 2.2 06/16/2015 1004   MONOABS 0.9 06/16/2015 1004   EOSABS 0.4 06/16/2015 1004   BASOSABS 0.0 06/16/2015 1004    BMET    Component Value Date/Time    NA 137 06/21/2019 1034   K 3.8 06/21/2019 1034   CL 97 (L) 06/21/2019 1034   CO2 22 11/20/2018 0414   GLUCOSE 92 06/21/2019 1034   BUN 10 06/21/2019 1034   CREATININE 0.60 (L) 06/21/2019 1034   CALCIUM 9.3 11/20/2018 0414   GFRNONAA >  60 11/20/2018 0414   GFRAA >60 11/20/2018 0414    COAGS: Lab Results  Component Value Date   INR 1.1 11/20/2018   INR 1.1 11/18/2018     CT scan from [place] reviewed demonstrates subtotal occlusion of SMA with replaced right hepatic artery.  ASSESSMENT/PLAN: This is a 74 y.o. male previous history endovascular aneurysm repair.  Now has SMA subtotal occlusion is indicated for angiography possible intervention.  Ashlin Kreps C. Donzetta Matters, MD [facility] Office: [phone] Pager: [phone]

## 2019-06-21 NOTE — Op Note (Signed)
    Patient name: Samuel [name] MRN: [id number] DOB: January 27, 1946 Sex: male  06/21/2019 Pre-operative Diagnosis: Chronic mesenteric ischemia Post-operative diagnosis:  Same Surgeon:  Erlene Quan C. Donzetta Matters, MD Procedure Performed: 1.  Ultrasound-guided cannulation right common femoral artery 2.  Aortogram 3.  Stent of SMA with 5 x 18 mm Herculink 4.  Moderate sedation with fentanyl Versed for 67 minutes 5.  Mynx device closure right common femoral artery  Indications: 74 year old male previously underwent endovascular aneurysm repair.  He has recent significant weight loss totaling approximately 40 pounds in the past several months.  CT scan performed at the [facility] demonstrate occlusion or subtotal occlusion of the SMA.  He is now indicated for angiogram possible invention of the SMA.  Findings: Endovascular aneurysm repair appears intact.  Celiac artery is has at least 50% stenosis.  SMA is occluded at the ostium with 1 tiny little channel proximally nothing distally a large calcific plaque blocking the ostium.  There is no reconstitution demonstrable initially.  We were able to cross into the reconstituted vessels.  We then performed balloon dilation demonstrated good distal SMA.  Stent was placed.  Completion demonstrated no residual stenosis or dissection.  Patient tolerated procedure without immediate complication   Procedure:  The patient was identified in the holding area and taken to room 8.  The patient was then placed supine on the table and prepped and draped in the usual sterile fashion.  A time out was called.  Ultrasound was used to evaluate the right common femoral artery.  There was some disease distally nothing proximally.  There is anesthetized 1% lidocaine cannulated with direct ultrasound visualization a micropuncture needle followed the wire and sheath.  And images saved the permanent record.  A Bentson wire was placed followed by a 6 [place] sheath.  Omni catheter was placed the level of  T12 and lateral aortogram performed.  We then performed AP aortogram as well.  Again a lateral aortogram was performed we did demonstrate a very small channel proximally in the SMA.  Patient was heparinized.  IM guide catheter was used we initially used Bentson wire followed by a Glidewire followed by the 18 wire.  We were able to cross the vessel.  We used CXI catheter to get across and demonstrated intraluminal access.  We were ultimately able to balloon the proximal SMA with 4 mm balloon.  Angiography after this demonstrated a much larger SMA.  A 5 x 18 mm Herculink stent was then brought in deployed at the ostium.  Completion demonstrated no residual stenosis or dissection.  There is significant calcification at the ostium that does block some visualization.  Given that he tolerated procedure well now had good flow into his SMA I elected no further intervention.  Minx device was deployed in the groin through the 6 [place] sheath.  He tolerated procedure without immediate complication.  Contrast: 115 cc   Lachrisha Ziebarth C. Donzetta Matters, MD [facility] [place] [facility] Office: [phone] Pager: [phone]

## 2019-06-21 NOTE — Discharge Instructions (Signed)
Angiogram, Care After This sheet gives you information about how to care for yourself after your procedure. Your health care provider may also give you more specific instructions. If you have problems or questions, contact your health care provider. What can I expect after the procedure? After the procedure, it is common to have bruising and tenderness at the catheter insertion area. Follow these instructions at home: Insertion site care  Follow instructions from your health care provider about how to take care of your insertion site. Make sure you: ? Wash your hands with soap and water before you change your bandage (dressing). If soap and water are not available, use hand sanitizer. ? Change your dressing as told by your health care provider. ? Leave stitches (sutures), skin glue, or adhesive strips in place. These skin closures may need to stay in place for 2 weeks or longer. If adhesive strip edges start to loosen and curl up, you may trim the loose edges. Do not remove adhesive strips completely unless your health care provider tells you to do that.  Do not take baths, swim, or use a hot tub until your health care provider approves.  You may shower 24-48 hours after the procedure or as told by your health care provider. ? Gently wash the site with plain soap and water. ? Pat the area dry with a clean towel. ? Do not rub the site. This may cause bleeding.  Do not apply powder or lotion to the site. Keep the site clean and dry.  Check your insertion site every day for signs of infection. Check for: ? Redness, swelling, or pain. ? Fluid or blood. ? Warmth. ? Pus or a bad smell. Activity  Rest as told by your health care provider, usually for 1-2 days.  Do not lift anything that is heavier than 10 lbs. (4.5 kg) or as told by your health care provider.  Do not drive for 24 hours if you were given a medicine to help you relax (sedative).  Do not drive or use heavy machinery while  taking prescription pain medicine. General instructions   Return to your normal activities as told by your health care provider, usually in about a week. Ask your health care provider what activities are safe for you.  If the catheter site starts bleeding, lie flat and put pressure on the site. If the bleeding does not stop, get help right away. This is a medical emergency.  Drink enough fluid to keep your urine clear or pale yellow. This helps flush the contrast dye from your body.  Take over-the-counter and prescription medicines only as told by your health care provider.  Keep all follow-up visits as told by your health care provider. This is important. Contact a health care provider if:  You have a fever or chills.  You have redness, swelling, or pain around your insertion site.  You have fluid or blood coming from your insertion site.  The insertion site feels warm to the touch.  You have pus or a bad smell coming from your insertion site.  You have bruising around the insertion site.  You notice blood collecting in the tissue around the catheter site (hematoma). The hematoma may be painful to the touch. Get help right away if:  You have severe pain at the catheter insertion area.  The catheter insertion area swells very fast.  The catheter insertion area is bleeding, and the bleeding does not stop when you hold steady pressure on the area.    The area near or just beyond the catheter insertion site becomes pale, cool, tingly, or numb. These symptoms may represent a serious problem that is an emergency. Do not wait to see if the symptoms will go away. Get medical help right away. Call your local emergency services (911 in the [place] Do not drive yourself to the hospital. Summary  After the procedure, it is common to have bruising and tenderness at the catheter insertion area.  After the procedure, it is important to rest and drink plenty of fluids.  Do not take baths,  swim, or use a hot tub until your health care provider says it is okay to do so. You may shower 24-48 hours after the procedure or as told by your health care provider.  If the catheter site starts bleeding, lie flat and put pressure on the site. If the bleeding does not stop, get help right away. This is a medical emergency. This information is not intended to replace advice given to you by your health care provider. Make sure you discuss any questions you have with your health care provider. Document Revised: 05/09/2017 Document Reviewed: 05/01/2016 Elsevier Patient Education  2020 [employer] Inc[employer]  

## 2019-06-25 ENCOUNTER — Telehealth: Payer: Self-pay

## 2019-06-25 NOTE — Telephone Encounter (Signed)
Pt called and said that he was having some bruising around his incision site and that he had a little swelling in his foot as well  Called pt and told him that bruising was normal as long as it was soft and he states that it is. He said that he feels good and he is eating better also.   He was advised to elevate his leg above his heart level to help with the swelling.   He said that he is feeling so much better.  York Cerise, CMA

## 2019-07-23 ENCOUNTER — Other Ambulatory Visit: Payer: Self-pay

## 2019-07-23 ENCOUNTER — Telehealth (HOSPITAL_COMMUNITY): Payer: Self-pay

## 2019-07-23 DIAGNOSIS — I714 Abdominal aortic aneurysm, without rupture, unspecified: Secondary | ICD-10-CM

## 2019-07-23 NOTE — Telephone Encounter (Signed)

## 2019-07-26 ENCOUNTER — Ambulatory Visit (HOSPITAL_COMMUNITY)
Admission: RE | Admit: 2019-07-26 | Discharge: 2019-07-26 | Disposition: A | Discharge status: Home / Self Care | Payer: Medicare Other | Source: Ambulatory Visit | Attending: Vascular Surgery | Admitting: Vascular Surgery

## 2019-07-26 ENCOUNTER — Other Ambulatory Visit: Payer: Self-pay

## 2019-07-26 DIAGNOSIS — I714 Abdominal aortic aneurysm, without rupture, unspecified: Secondary | ICD-10-CM

## 2019-07-30 ENCOUNTER — Other Ambulatory Visit: Payer: Self-pay

## 2019-07-30 ENCOUNTER — Ambulatory Visit (INDEPENDENT_AMBULATORY_CARE_PROVIDER_SITE_OTHER): Payer: Medicare Other | Admitting: Physician Assistant

## 2019-07-30 ENCOUNTER — Telehealth: Payer: Self-pay | Admitting: *Deleted

## 2019-07-30 VITALS — BP 132/71 | Ht 70.0 in | Wt 155.0 lb

## 2019-07-30 DIAGNOSIS — I713 Abdominal aortic aneurysm, ruptured, unspecified: Secondary | ICD-10-CM

## 2019-07-30 DIAGNOSIS — K551 Chronic vascular disorders of intestine: Secondary | ICD-10-CM

## 2019-07-30 NOTE — Progress Notes (Signed)
Virtual Visit via Telephone Note    I connected with Samuel [name] on 07/30/2019 using the [email or web address] by telephone and verified that I was speaking with the correct person using two identifiers. Patient was located at home. I am located at [facility].   The limitations of evaluation and management by telemedicine and the availability of in person appointments have been previously discussed with the patient and are documented in the patients chart. The patient expressed understanding and consented to proceed.  PCP: [name], MD     History of Present Illness: Samuel [name] is a 74 y.o.  male previously underwent endovascular aneurysm repair.  He has recent significant weight loss totaling approximately 40 pounds in the past several months.  CT scan performed at [facility] demonstrate occlusion or subtotal occlusion of the SMA.  He is now indicated for angiogram possible invention of the SMA.  06/21/19 Angiogram Findings: Endovascular aneurysm repair appears intact.  Celiac artery is has at least 50% stenosis.  SMA is occluded at the ostium with 1 tiny little channel proximally nothing distally a large calcific plaque blocking the ostium.  There is no reconstitution demonstrable initially.  We were able to cross into the reconstituted vessels.  We then performed balloon dilation demonstrated good distal SMA.  Stent was placed.  Completion demonstrated no residual stenosis or dissection.  Patient tolerated procedure without immediate complication.  He denise abdominal pain, nausea, vomiting or post prandial pain.  He denise fever, chills or loss of appetite.   Past Medical History:  Diagnosis Date  . AAA (abdominal aortic aneurysm) ([place])   . Carotid artery occlusion   . Genital warts    history of  . History of carotid stenosis    s/p right carotid endarterectomy 12/06  . Hyperlipidemia   . Hypertension   . PAD (peripheral artery disease) ([facility])    Left leg    Past  Surgical History:  Procedure Laterality Date  . ABDOMINAL AORTIC ENDOVASCULAR STENT GRAFT N/A 11/19/2018   Procedure: ABDOMINAL AORTIC ENDOVASCULAR STENT GRAFT;  Surgeon: Waynetta Sandy, MD;  Location: [facility];  Service: Vascular;  Laterality: N/A;  . [place]  . CAROTID ENDARTERECTOMY  05/2005   right  . COLONOSCOPY    . [place]  . Left Leg stents  2012  . VISCERAL ANGIOGRAPHY N/A 06/21/2019   Procedure: VISCERAL ANGIOGRAPHY;  Surgeon: Waynetta Sandy, MD;  Location: [facility];  Service: Cardiovascular;  Laterality: N/A;  . VISCERAL ARTERY INTERVENTION N/A 06/21/2019   Procedure: VISCERAL ARTERY INTERVENTION;  Surgeon: Waynetta Sandy, MD;  Location: [facility];  Service: Cardiovascular;  Laterality: N/A;  Stent to SMA    No outpatient medications have been marked as taking for the 07/30/19 encounter (Appointment) with [facility] PA.    12 system ROS was negative unless otherwise noted in HPI   Observations/Objective:   Duplex Findings:  +----------------------+--------+--------+------+--------+  Mesenteric      PSV cm/sEDV cm/sPlaqueComments  +----------------------+--------+--------+------+--------+  Aorta Prox        48               +----------------------+--------+--------+------+--------+  Celiac Artery Origin  227               +----------------------+--------+--------+------+--------+  Celiac Artery Proximal 249               +----------------------+--------+--------+------+--------+  SMA Origin       400               +----------------------+--------+--------+------+--------+  SMA Proximal      805          stent   +----------------------+--------+--------+------+--------+  SMA Mid         150                 +----------------------+--------+--------+------+--------+  SMA Distal       147               +----------------------+--------+--------+------+--------+           Summary:  Mesenteric:  70 to 99% stenosis in the superior mesenteric artery stent.     Assessment and Plan: Post operative visit s/p SMA angioplasty and stent  He is asymptomatic without N/V or post prandial pain. The Mesenteric duplex performed  06/21/19 70 to 99% stenosis in the superior mesenteric artery stent.     Follow Up Instructions:   Follow up F/U in 1-2 weeks with repeat CTA abdomin and pelvis   I discussed the assessment and treatment plan with the patient. The patient was provided an opportunity to ask questions and all were answered. The patient agreed with the plan and demonstrated an understanding of the instructions.   The patient was advised to call back or seek an in-person evaluation if the symptoms worsen or if the condition fails to improve as anticipated.  I spent 15 minutes with the patient via telephone encounter.   Signed, Roxy Horseman [facility] Office: [phone]  07/30/2019, 12:38 PM

## 2019-08-10 ENCOUNTER — Ambulatory Visit
Admission: RE | Admit: 2019-08-10 | Discharge: 2019-08-10 | Disposition: A | Discharge status: Home / Self Care | Payer: Medicare Other | Source: Ambulatory Visit | Attending: Vascular Surgery | Admitting: Vascular Surgery

## 2019-08-10 ENCOUNTER — Other Ambulatory Visit: Payer: Self-pay

## 2019-08-10 DIAGNOSIS — I714 Abdominal aortic aneurysm, without rupture: Secondary | ICD-10-CM | POA: Diagnosis not present

## 2019-08-10 DIAGNOSIS — I713 Abdominal aortic aneurysm, ruptured, unspecified: Secondary | ICD-10-CM

## 2019-08-10 MED ORDER — IOPAMIDOL (ISOVUE-370) INJECTION 76%
75.0000 mL | Freq: Once | INTRAVENOUS | Status: AC | PRN
  Administered 2019-08-10: 75 mL via INTRAVENOUS

## 2019-08-13 ENCOUNTER — Ambulatory Visit (INDEPENDENT_AMBULATORY_CARE_PROVIDER_SITE_OTHER): Payer: Medicare Other | Admitting: Vascular Surgery

## 2019-08-13 ENCOUNTER — Other Ambulatory Visit: Payer: Self-pay

## 2019-08-13 ENCOUNTER — Encounter: Payer: Self-pay | Admitting: Vascular Surgery

## 2019-08-13 VITALS — BP 145/67 | HR 73 | Temp 97.4°F | Resp 20 | Ht 70.0 in | Wt 163.0 lb

## 2019-08-13 DIAGNOSIS — K551 Chronic vascular disorders of intestine: Secondary | ICD-10-CM | POA: Diagnosis not present

## 2019-08-13 NOTE — Progress Notes (Signed)
Virtual Visit via Telephone Note   I connected with Samuel [name] on 08/13/2019 using the [email or web address] by telephone and verified that I was speaking with the correct person using two identifiers. Patient was located at home.    PCP: [name], MD  History of Present Illness: Samuel [name] is a 74 y.o. male with history of chronic mesenteric ischemia underwent SMA stenting with a noncovered stent because of the location of the first branch.  Since that time he has gained 14 pounds from 143 up to 157.  He is taking aspirin has severe allergy to statins.  He stopped Plavix for eye surgery after our procedure but is willing to restart.  Does not have any significant pain at this time.  Eating without limitations and having normal bowel function.  Past Medical History:  Diagnosis Date  . AAA (abdominal aortic aneurysm) ([place])   . Carotid artery occlusion   . Genital warts    history of  . History of carotid stenosis    s/p right carotid endarterectomy 12/06  . Hyperlipidemia   . Hypertension   . PAD (peripheral artery disease) ([facility])    Left leg    Past Surgical History:  Procedure Laterality Date  . ABDOMINAL AORTIC ENDOVASCULAR STENT GRAFT N/A 11/19/2018   Procedure: ABDOMINAL AORTIC ENDOVASCULAR STENT GRAFT;  Surgeon: Waynetta Sandy, MD;  Location: [facility];  Service: Vascular;  Laterality: N/A;  . [place]  . CAROTID ENDARTERECTOMY  05/2005   right  . COLONOSCOPY    . [place]  . Left Leg stents  2012  . VISCERAL ANGIOGRAPHY N/A 06/21/2019   Procedure: VISCERAL ANGIOGRAPHY;  Surgeon: Waynetta Sandy, MD;  Location: [facility];  Service: Cardiovascular;  Laterality: N/A;  . VISCERAL ARTERY INTERVENTION N/A 06/21/2019   Procedure: VISCERAL ARTERY INTERVENTION;  Surgeon: Waynetta Sandy, MD;  Location: [facility];  Service: Cardiovascular;  Laterality: N/A;  Stent to SMA    Current Meds  Medication Sig  .  aspirin EC 81 MG tablet Take 81 mg by mouth daily after breakfast.  . Cholecalciferol (VITAMIN D3) 125 MCG (5000 UT) CHEW Chew 10,000 Units by mouth daily after breakfast.  . lisinopril-hydrochlorothiazide (PRINZIDE,ZESTORETIC) 10-12.5 MG tablet TAKE 1 TABLET BY MOUTH DAILY. (Patient taking differently: Take 1 tablet by mouth daily after breakfast. )  . Multiple Vitamin (MULTIVITAMIN WITH MINERALS) TABS tablet Take 1 tablet by mouth daily after breakfast.  . sildenafil (VIAGRA) 25 MG tablet Take 25 mg by mouth daily as needed for erectile dysfunction.    12 system ROS was negative unless otherwise noted in HPI   Observations/Objective: Vitals:   08/13/19 0814  BP: (!) 145/67  Pulse: 73  Resp: 20  Temp: (!) 97.4 F (36.3 C)  SpO2: 100%   aaox3 and carrying conversation without limitation  CT IMPRESSION: VASCULAR  1. Patent infrarenal bifurcated stent graft with no endoleak, slight decrease in native sac diameter to 5 x 4 cm. 2. Interval SMA origin stent placement, possibly occluded, with short-segment dissection just beyond the stent of doubtful hemodynamic significance.  NON-VASCULAR  1. No acute findings.   Assessment and Plan: 74 year old male status post stenting of his SMA for chronic mesenteric ischemia.  Patient has gained 14 pounds since procedure.  Stent with high-grade stenosis by duplex but there is heavy calcium there.  CT scan difficult to interpret given the amount of calcium around the noncovered stent.  Patient without  any symptoms at this time would recommend continued nonoperative management.  He will restart Plavix continue aspirin.  I will see him back in 6 months with repeat duplex.  If he has issues before then he knows to call to be seen urgently.  Follow Up Instructions: 6 months with mesenteric duplex.   I discussed the assessment and treatment plan with the patient. The patient was provided an opportunity to ask questions and all were answered. The  patient agreed with the plan and demonstrated an understanding of the instructions.   The patient was advised to call back or seek an in-person evaluation if the symptoms worsen or if the condition fails to improve as anticipated.  I spent 12 minutes reviewing records and with the patient via telephone encounter.   Signed, Servando Snare [facility] Office: [phone]  08/13/2019, 12:05 PM

## 2019-08-16 ENCOUNTER — Other Ambulatory Visit: Payer: Self-pay | Admitting: *Deleted

## 2019-08-16 DIAGNOSIS — K551 Chronic vascular disorders of intestine: Secondary | ICD-10-CM

## 2019-11-25 ENCOUNTER — Telehealth: Payer: Self-pay

## 2019-11-25 NOTE — Telephone Encounter (Signed)
Triage call received from pt reporting soreness behind belly button mainly after lifting greater than 30 lbs since before last office visit in March and thought it was a pulled muscle. He request to have it checked out because he desires to start a workout program. Per Risa Grill, PA-C unlikely r/t stent if not having any severe constant pain after eating, wt loss, or severe back pain. Regarding working out, no vascular procedure performed should interfere with him working out. Recommend pt to f/u with PCP for symptoms and prior to starting new work regimen. Pt voiced understanding.

## 2019-11-26 NOTE — Telephone Encounter (Signed)
Risa Grill, PA-C advised pt needs to be scheduled for OV for evaluation per recommendations from Dr. Donzetta Matters. Pt contacted and scheduled for 11/29/19 at 1015. Pt voiced understanding.

## 2019-11-29 ENCOUNTER — Other Ambulatory Visit: Payer: Self-pay

## 2019-11-29 ENCOUNTER — Ambulatory Visit (INDEPENDENT_AMBULATORY_CARE_PROVIDER_SITE_OTHER): Payer: Medicare Other | Admitting: Physician Assistant

## 2019-11-29 VITALS — BP 142/72 | HR 77 | Temp 98.2°F | Resp 20 | Ht 70.0 in | Wt 170.8 lb

## 2019-11-29 DIAGNOSIS — I714 Abdominal aortic aneurysm, without rupture, unspecified: Secondary | ICD-10-CM

## 2019-11-29 DIAGNOSIS — K551 Chronic vascular disorders of intestine: Secondary | ICD-10-CM

## 2019-11-29 DIAGNOSIS — I6523 Occlusion and stenosis of bilateral carotid arteries: Secondary | ICD-10-CM | POA: Diagnosis not present

## 2019-11-29 NOTE — Progress Notes (Signed)
Office Note     CC:  follow up Requesting Provider:  [name], MD  HPI: Samuel [name] is a 74 y.o. (1946/04/22) male who presents with intermittent abdominal pain when lifting. He noticed this several months ago, but as he has increased his activity and the pain is more noticeable. He describes it behind his navel. He denies weight loss, post-prandial pain, nausea, or vomiting. Having regular bowel movements. Denies claudication, rest pain, symptoms of TIA/stoke. He has not resumed his Plavix.  He has a history of chronic mesenteric ischemia underwent SMA stenting with a noncovered stent because of the location of the first branch on June 21, 2019.  He was last evaluated virtually by Dr. Donzetta Matters in March of this year.  At that time he had no postprandial pain and had normal bowel function.  He was compliant with his aspirin but had been off his Plavix secondary to eye surgery.  It was recommended he resume this.  He has a severe allergy to statins.  Additional history includes abdominal aortic endovascular stent graft on November 19, 2018, left SFA and proximal popliteal artery stents, right carotid endarterectomy in 2006 (asymptomatic disease).  Past Medical History:  Diagnosis Date  . AAA (abdominal aortic aneurysm) ([place])   . Carotid artery occlusion   . Genital warts    history of  . History of carotid stenosis    s/p right carotid endarterectomy 12/06  . Hyperlipidemia   . Hypertension   . PAD (peripheral artery disease) ([facility])    Left leg    Past Surgical History:  Procedure Laterality Date  . ABDOMINAL AORTIC ENDOVASCULAR STENT GRAFT N/A 11/19/2018   Procedure: ABDOMINAL AORTIC ENDOVASCULAR STENT GRAFT;  Surgeon: Waynetta Sandy, MD;  Location: [facility];  Service: Vascular;  Laterality: N/A;  . [place]  . CAROTID ENDARTERECTOMY  05/2005   right  . COLONOSCOPY    . [place]  . Left Leg stents  2012  . VISCERAL ANGIOGRAPHY N/A 06/21/2019    Procedure: VISCERAL ANGIOGRAPHY;  Surgeon: Waynetta Sandy, MD;  Location: [facility];  Service: Cardiovascular;  Laterality: N/A;  . VISCERAL ARTERY INTERVENTION N/A 06/21/2019   Procedure: VISCERAL ARTERY INTERVENTION;  Surgeon: Waynetta Sandy, MD;  Location: [facility];  Service: Cardiovascular;  Laterality: N/A;  Stent to SMA    Social History   Socioeconomic History  . Marital status: Married    Spouse name: Not on file  . Number of children: Not on file  . Years of education: Not on file  . Highest education level: Not on file  Occupational History    Employer: RETIRED    Comment: self employed - [employer]  Tobacco Use  . Smoking status: Former Research scientist (life sciences)  . Smokeless tobacco: Never Used  . Tobacco comment: quit- for porlonged periods he has smoked 2-3 packs a day  Vaping Use  . Vaping Use: Never used  Substance and Sexual Activity  . Alcohol use: Yes    Alcohol/week: 24.0 standard drinks    Types: 24 Cans of beer per week  . Drug use: No  . Sexual activity: Not on file  Other Topics Concern  . Not on file  Social History Narrative   Married -to his second wife Idamae Schuller).  Has a daughter from his first marriage who is in her early 35s.     He drinks approximately 4 alcoholic beverages, beer, a day.  He has done so since  age 4.  Denies any binge drinking or heavy alcohol use.  Quit tobacco 2 year ago.  He has smoked on and off x20-30 years.  For prolonged periods he has smoked 2-3 packs a day.     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  She lives in [place], [place]   Social Determinants of Health   Financial Resource Strain:   . Difficulty of Paying Living Expenses:   Food Insecurity:   . Worried About Charity fundraiser in the Last Year:   . Arboriculturist in the Last Year:   Transportation Needs:   . Film/video editor (Medical):   Marland Kitchen Lack of Transportation (Non-Medical):     Physical Activity:   . Days of Exercise per Week:   . Minutes of Exercise per Session:   Stress:   . Feeling of Stress :   Social Connections:   . Frequency of Communication with Friends and Family:   . Frequency of Social Gatherings with Friends and Family:   . Attends Religious Services:   . Active Member of Clubs or Organizations:   . Attends Archivist Meetings:   Marland Kitchen Marital Status:   Intimate Partner Violence:   . Fear of Current or Ex-Partner:   . Emotionally Abused:   Marland Kitchen Physically Abused:   . Sexually Abused:    Family History  Problem Relation Age of Onset  . Stroke Mother        deceased age 40 had her firsrt CVA age  69  . Hyperlipidemia Mother   . Peripheral vascular disease Father        status post leg amputation  secondary to PVD  . Hyperlipidemia Father   . Peripheral vascular disease Brother   . Colon cancer Neg Hx   . Esophageal cancer Neg Hx   . Prostate cancer Neg Hx   . Rectal cancer Neg Hx   . Stomach cancer Neg Hx     Current Outpatient Medications  Medication Sig Dispense Refill  . aspirin EC 81 MG tablet Take 81 mg by mouth daily after breakfast.    . Cholecalciferol (VITAMIN D3) 125 MCG (5000 UT) CHEW Chew 10,000 Units by mouth daily after breakfast.    . clopidogrel (PLAVIX) 75 MG tablet Take 1 tablet (75 mg total) by mouth daily. (Patient not taking: Reported on 07/30/2019) 30 tablet 11  . lisinopril-hydrochlorothiazide (PRINZIDE,ZESTORETIC) 10-12.5 MG tablet TAKE 1 TABLET BY MOUTH DAILY. (Patient taking differently: Take 1 tablet by mouth daily after breakfast. ) 30 tablet 5  . Multiple Vitamin (MULTIVITAMIN WITH MINERALS) TABS tablet Take 1 tablet by mouth daily after breakfast.    . sildenafil (VIAGRA) 25 MG tablet Take 25 mg by mouth daily as needed for erectile dysfunction.     No current facility-administered medications for this visit.    Allergies  Allergen Reactions  . Diclofenac Other (See Comments)    diarrhea  .  Lipitor [Atorvastatin] Other (See Comments)    Muscle pain  . Livalo [Pitavastatin] Other (See Comments)    Muscle aches  . Statins     Muscle aches     REVIEW OF SYSTEMS:   [X]  denotes positive finding, [ ]  denotes negative finding Cardiac  Comments:  Chest pain or chest pressure:    Shortness of breath upon exertion:    Short of breath when lying flat:    Irregular heart rhythm:  Vascular    Pain in calf, thigh, or hip brought on by ambulation:    Pain in feet at night that wakes you up from your sleep:     Blood clot in your veins:    Leg swelling:         Pulmonary    Oxygen at home:    Productive cough:     Wheezing:         Neurologic    Sudden weakness in arms or legs:     Sudden numbness in arms or legs:     Sudden onset of difficulty speaking or slurred speech:    Temporary loss of vision in one eye:     Problems with dizziness:         Gastrointestinal    Blood in stool:     Vomited blood:         Genitourinary    Burning when urinating:     Blood in urine:        Psychiatric    Major depression:         Hematologic    Bleeding problems:    Problems with blood clotting too easily:        Skin    Rashes or ulcers:        Constitutional    Fever or chills:      PHYSICAL EXAMINATION:  Vitals:   11/29/19 1008  BP: (!) 142/72  Pulse: 77  Resp: 20  Temp: 98.2 F (36.8 C)  SpO2: 99%   General:  WDWN in NAD; vital signs documented above Gait: Not observed HENT: WNL, normocephalic Pulmonary: normal non-labored breathing , without Rales, rhonchi,  wheezing Cardiac: regular HR, with systolic Murmurs with left carotid bruit Abdomen: soft, NT, no masses. + abdominal bruit Skin: without rashes Extremities: without ischemic changes, without Gangrene , without cellulitis; without open wounds; Feet are warm and well perfused.  No skin breakdown Musculoskeletal: no muscle wasting or atrophy  Neurologic: A&O X 3;  No focal weakness or  paresthesias are detected Psychiatric:  The pt has Normal affect.   ASSESSMENT/PLAN:: 74 y.o. male presents with intermittent umbilical pain.  No associated symptoms to suggest mesenteric ischemia. Advised to monitor and to call office if symptoms worsen.  Resume Plavix as prescribed.  He is due for carotid, EVAR and LLE duplex as well as ABIs.  We will make arrangements for these exams.  PAD- Reveiwed signs and symptoms of LE ischemia, non healing wounds.  He is instructed to call the office should he develop these.  Carotid stenosis: Reviewed symptoms of TIA/stroke and to see immediate medical attention should these occur.  Barbie Banner, PA-C [facility] [phone]

## 2019-12-01 ENCOUNTER — Other Ambulatory Visit: Payer: Self-pay | Admitting: *Deleted

## 2019-12-01 DIAGNOSIS — I714 Abdominal aortic aneurysm, without rupture, unspecified: Secondary | ICD-10-CM

## 2019-12-01 DIAGNOSIS — I6523 Occlusion and stenosis of bilateral carotid arteries: Secondary | ICD-10-CM

## 2019-12-01 DIAGNOSIS — I779 Disorder of arteries and arterioles, unspecified: Secondary | ICD-10-CM

## 2020-01-03 IMAGING — CT CT ANGIOGRAPHY ABDOMEN AND PELVIS WITH CONTRAST AND WITHOUT CONT
1 of 5 series · 12 of 32 positions shown, 17 images · IV contrast (APPLIED)
Comparison: 08/24/2018

CLINICAL DATA: Abdominal aortic aneurysm, post repair

EXAM:
CTA ABDOMEN AND PELVIS wITHOUT AND WITH CONTRAST
TECHNIQUE: Multidetector CT imaging of the abdomen and pelvis was performed
using the standard protocol during bolus administration of
intravenous contrast. Multiplanar reconstructed images and MIPs were
obtained and reviewed to evaluate the vascular anatomy.
CONTRAST:  75mL RI4Z8B-YAX IOPAMIDOL ([id number]-YAX) INJECTION 76%

[Series 5: angio · axial · 0.71mm/px · z∈[-503,-79]mm · 12 of 240 slices shown, 17 images]
[im 14/240  soft-tissue]
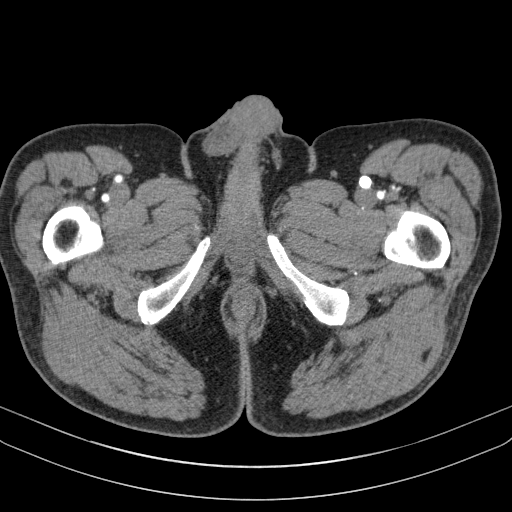
[im 14/240  bone]
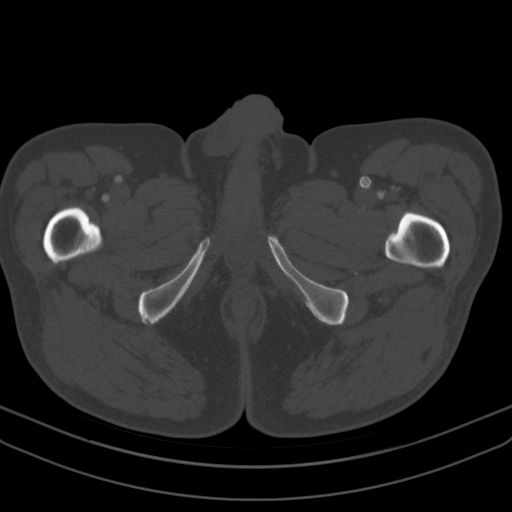
[im 40/240  soft-tissue]
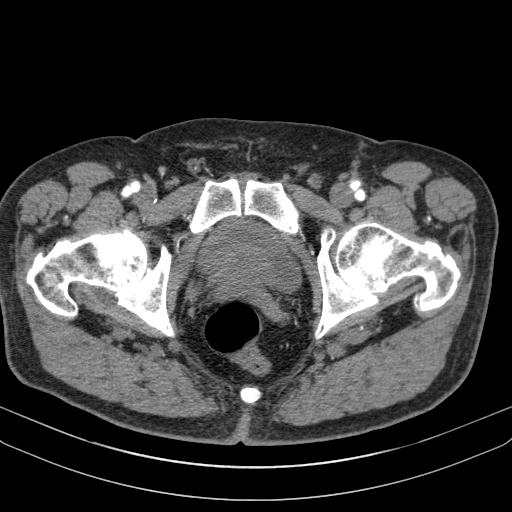
[im 54/240  soft-tissue]
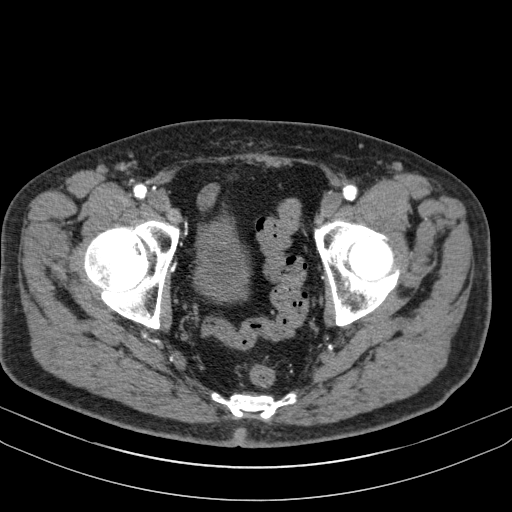
[im 80/240  soft-tissue]
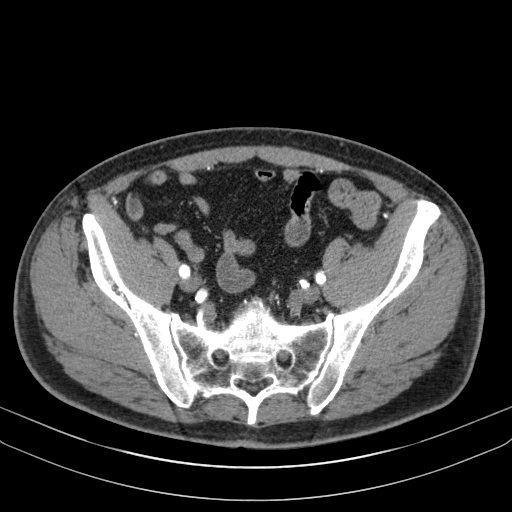
[im 93/240  soft-tissue]
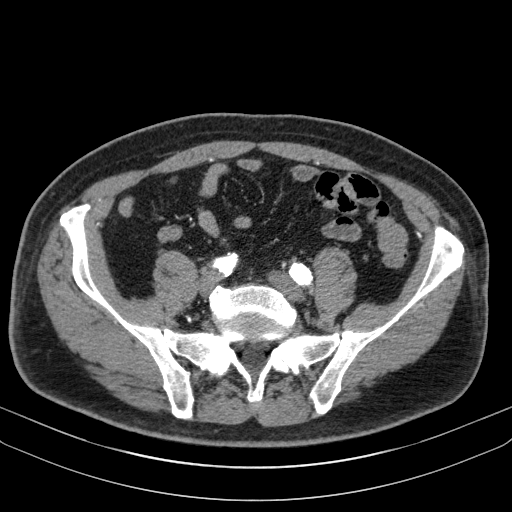
[im 120/240  soft-tissue]
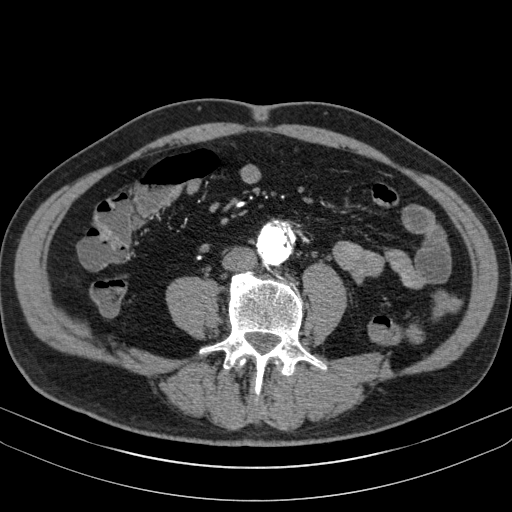
[im 147/240  soft-tissue]
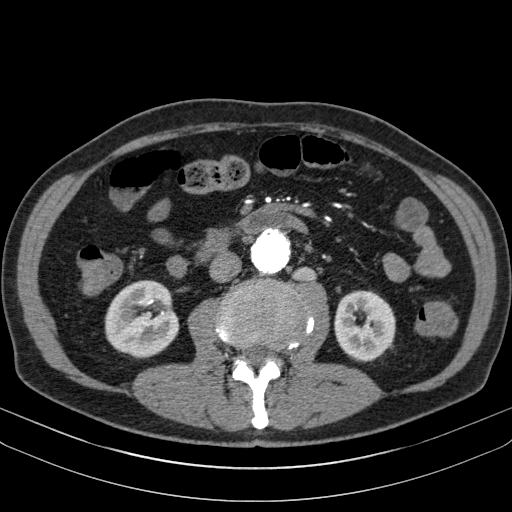
[im 160/240  soft-tissue]
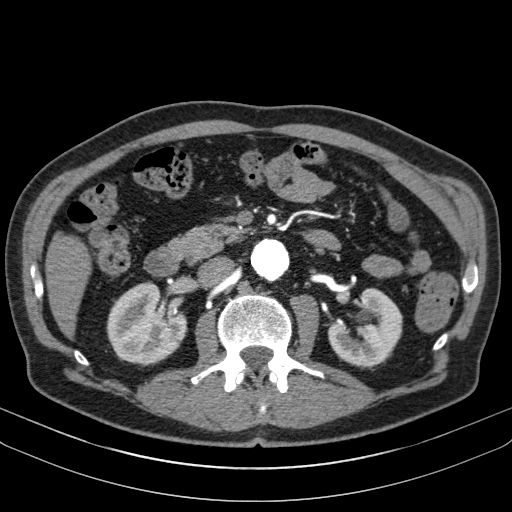
[im 186/240  soft-tissue]
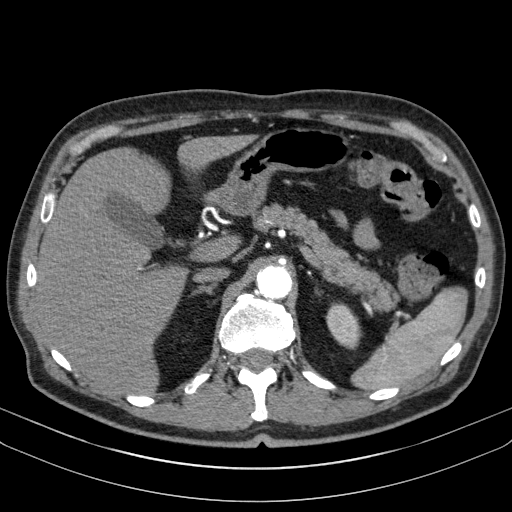
[im 186/240  lung]
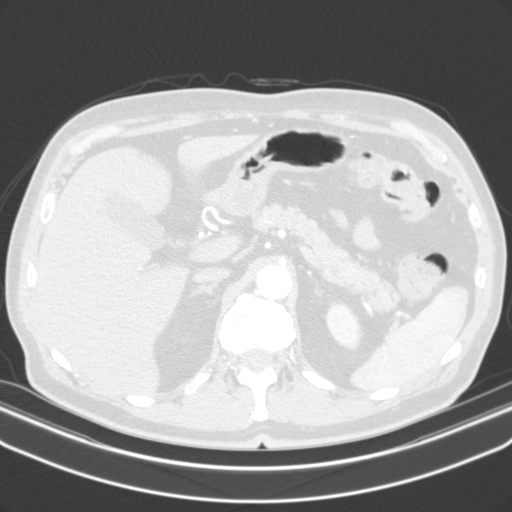
[im 186/240  bone]
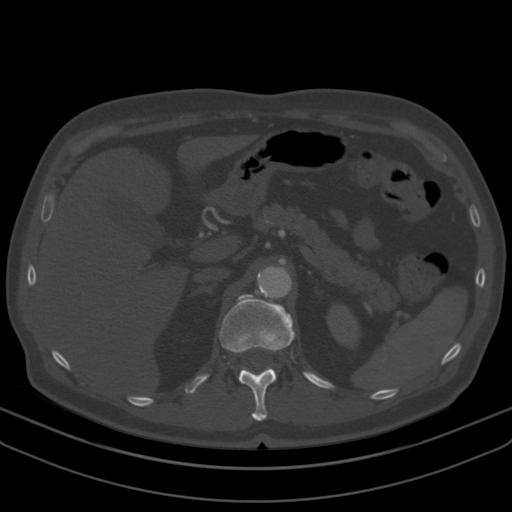
[im 200/240  soft-tissue]
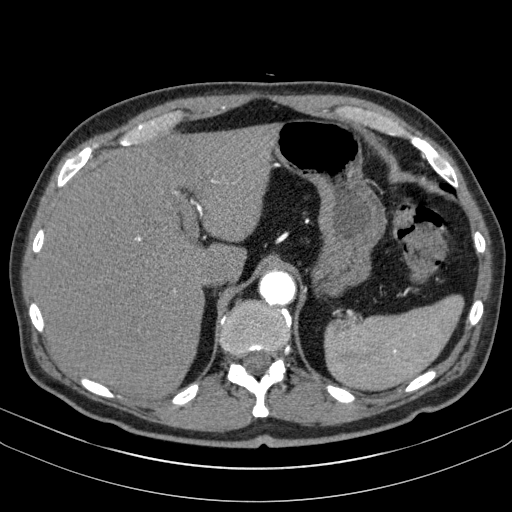
[im 200/240  lung]
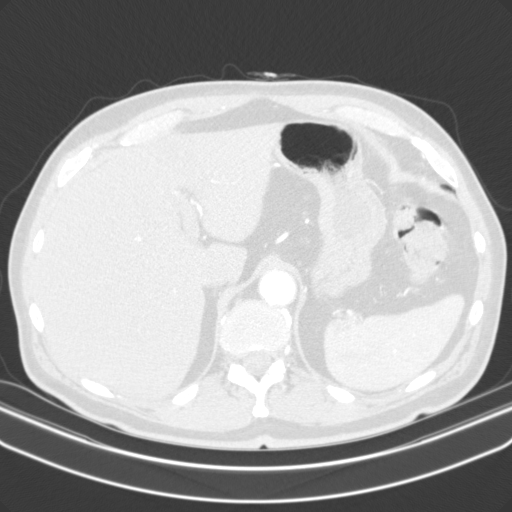
[im 213/240  lung]
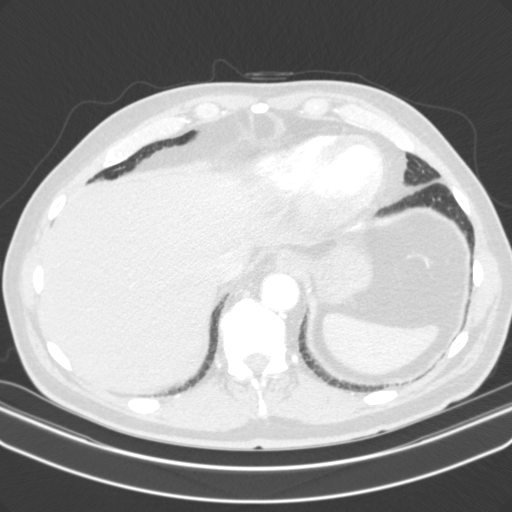
[im 226/240  soft-tissue]
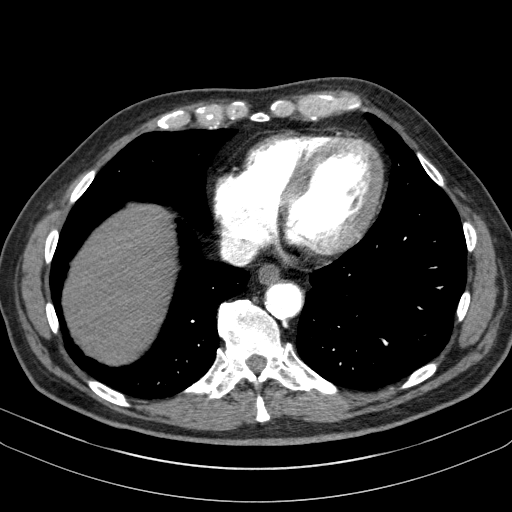
[im 226/240  lung]
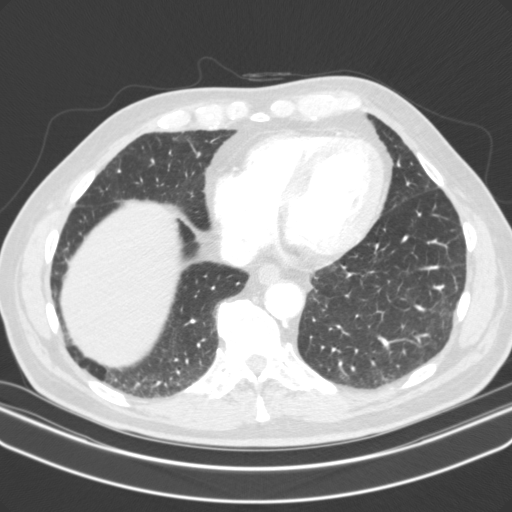

[12 of 32 positions shown; findings below may reference images not displayed]

FINDINGS: VASCULAR

Aorta: Scattered atheromatous calcified plaque in the visualized
distal descending thoracic and suprarenal segments. Interval
placement of infrarenal bifurcated stent graft. No endoleak. No
rupture. Native aneurysm sac measures 5.4 x 4.3 cm maximum
transverse dimensions ( previously 5.1 x 4.2).

Celiac: Calcified ostial plaque resulting in short segment stenosis
of at least mild severity, patent distally.

SMA: Heavily calcified ostial plaque extending over length of
approximate 1.7 cm resulting in high-grade stenosis or short segment
occlusion, patent distally. Replaced right hepatic arterial supply,
an anatomic variant.

Renals: Single left, with calcified ostial plaque resulting in short
segment stenosis of possible hemodynamic significance.

Single right, with calcified ostial plaque resulting in short
segment stenosis of at least mild severity, patent distally.

IMA: Non opacification proximally, reconstituted distally by
visceral collaterals.

Inflow: The right limb of the stent graft extends to the distal
common iliac, well apposed. Moderate calcified atheromatous plaque
in the native external and internal iliac arterial branches without
high-grade stenosis.

Left limb extends to the common iliac bifurcation, well apposed.
Heavy atheromatous plaque in the internal iliac artery and scattered
plaque in the external iliac without stenosis.

Proximal Outflow: Patent stent in the proximal left SFA partially
visualized.

Veins: Patent portal vein. Retroaortic left renal vein, an anatomic
variant. No venous pathology identified.

Review of the MIP images confirms the above findings.

NON-VASCULAR

Lower chest: Coarse subpleural interstitial opacities in the
visualized lung bases. No pleural or pericardial effusion.

Hepatobiliary: No focal liver abnormality is seen. No gallstones,
gallbladder wall thickening, or biliary dilatation.

Pancreas: Unremarkable. No pancreatic ductal dilatation or
surrounding inflammatory changes.

Spleen: Normal in size without focal abnormality.

Adrenals/Urinary Tract: Adrenal glands are unremarkable. Kidneys are
normal, without renal calculi, focal lesion, or hydronephrosis.
Bladder is unremarkable.

Stomach/Bowel: Stomach is nondistended. Small bowel decompressed.
Appendix surgically absent. The colon is nondilated with a few
scattered sigmoid diverticula.

Lymphatic: No abdominal or pelvic adenopathy.

Reproductive: Prostate enlargement.

Other: No ascites.  No free air.

Musculoskeletal: Lower lumbar spondylitic changes. Bilateral femoral
head AVN. No fracture or worrisome bone lesion.
IMPRESSION: VASCULAR

1. Interval bifurcated infrarenal aortic stent graft placement
without endoleak. Native sac diameter 5.4 cm.
2. Visceral and renal arterial origin stenoses as above.

NON-VASCULAR

1. No acute findings.

## 2020-02-04 ENCOUNTER — Ambulatory Visit (INDEPENDENT_AMBULATORY_CARE_PROVIDER_SITE_OTHER)
Admission: RE | Admit: 2020-02-04 | Discharge: 2020-02-04 | Disposition: A | Discharge status: Home / Self Care | Payer: Medicare Other | Source: Ambulatory Visit | Attending: Physician Assistant | Admitting: Physician Assistant

## 2020-02-04 ENCOUNTER — Other Ambulatory Visit: Payer: Self-pay

## 2020-02-04 ENCOUNTER — Ambulatory Visit (HOSPITAL_COMMUNITY)
Admission: RE | Admit: 2020-02-04 | Discharge: 2020-02-04 | Disposition: A | Discharge status: Home / Self Care | Payer: Medicare Other | Source: Ambulatory Visit | Attending: Vascular Surgery | Admitting: Vascular Surgery

## 2020-02-04 DIAGNOSIS — I6523 Occlusion and stenosis of bilateral carotid arteries: Secondary | ICD-10-CM | POA: Diagnosis not present

## 2020-02-04 DIAGNOSIS — I779 Disorder of arteries and arterioles, unspecified: Secondary | ICD-10-CM | POA: Insufficient documentation

## 2020-02-04 DIAGNOSIS — I714 Abdominal aortic aneurysm, without rupture, unspecified: Secondary | ICD-10-CM

## 2020-02-18 ENCOUNTER — Other Ambulatory Visit: Payer: Self-pay

## 2020-02-18 ENCOUNTER — Ambulatory Visit (HOSPITAL_COMMUNITY)
Admission: RE | Admit: 2020-02-18 | Discharge: 2020-02-18 | Disposition: A | Discharge status: Home / Self Care | Payer: Medicare Other | Source: Ambulatory Visit | Attending: Vascular Surgery | Admitting: Vascular Surgery

## 2020-02-18 ENCOUNTER — Encounter: Payer: Self-pay | Admitting: Vascular Surgery

## 2020-02-18 ENCOUNTER — Ambulatory Visit (INDEPENDENT_AMBULATORY_CARE_PROVIDER_SITE_OTHER): Payer: Medicare Other | Admitting: Vascular Surgery

## 2020-02-18 VITALS — BP 153/84 | HR 60 | Resp 20 | Ht 70.0 in | Wt 169.0 lb

## 2020-02-18 DIAGNOSIS — I714 Abdominal aortic aneurysm, without rupture, unspecified: Secondary | ICD-10-CM

## 2020-02-18 DIAGNOSIS — K551 Chronic vascular disorders of intestine: Secondary | ICD-10-CM | POA: Diagnosis not present

## 2020-02-18 NOTE — Progress Notes (Signed)
Patient ID: Samuel [name], male   DOB: 11/15/1945, 74 y.o.   MRN: [id number]  Reason for Consult: Follow-up   Referred by [name], MD  Subjective:     HPI:  Samuel [name] is a 74 y.o. male with a history of endovascular aneurysm repair now status post SMA stenting.  Since the time of his SMA stent he is now gained over 25 pounds.  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  He ambulates without any limitation at this time.  Overall he has no complaints.  He remains on aspirin and Plavix he cannot take statins due to severe muscle cramps.  Past Medical History:  Diagnosis Date  . AAA (abdominal aortic aneurysm) ([place])   . Carotid artery occlusion   . Genital warts    history of  . History of carotid stenosis    s/p right carotid endarterectomy 12/06  . Hyperlipidemia   . Hypertension   . PAD (peripheral artery disease) ([facility])    Left leg   Family History  Problem Relation Age of Onset  . Stroke Mother        deceased age 50 had her firsrt CVA age  18  . Hyperlipidemia Mother   . Peripheral vascular disease Father        status post leg amputation  secondary to PVD  . Hyperlipidemia Father   . Peripheral vascular disease Brother   . Colon cancer Neg Hx   . Esophageal cancer Neg Hx   . Prostate cancer Neg Hx   . Rectal cancer Neg Hx   . Stomach cancer Neg Hx    Past Surgical History:  Procedure Laterality Date  . ABDOMINAL AORTIC ENDOVASCULAR STENT GRAFT N/A 11/19/2018   Procedure: ABDOMINAL AORTIC ENDOVASCULAR STENT GRAFT;  Surgeon: Waynetta Sandy, MD;  Location: [facility];  Service: Vascular;  Laterality: N/A;  . [place]  . CAROTID ENDARTERECTOMY  05/2005   right  . COLONOSCOPY    . [place]  . Left Leg stents  2012  . VISCERAL ANGIOGRAPHY N/A 06/21/2019   Procedure: VISCERAL ANGIOGRAPHY;  Surgeon: Waynetta Sandy, MD;  Location: [facility];  Service: Cardiovascular;  Laterality: N/A;  . VISCERAL ARTERY INTERVENTION N/A 06/21/2019   Procedure: VISCERAL ARTERY INTERVENTION;  Surgeon: Waynetta Sandy, MD;  Location: [facility];  Service: Cardiovascular;  Laterality: N/A;  Stent to SMA    Short Social History:  Social History   Tobacco Use  . Smoking status: Former Research scientist (life sciences)  . Smokeless tobacco: Never Used  . Tobacco comment: quit- for porlonged periods he has smoked 2-3 packs a day  Substance Use Topics  . Alcohol use: Yes    Alcohol/week: 24.0 standard drinks    Types: 24 Cans of beer per week    Allergies  Allergen Reactions  . Diclofenac Other (See Comments)    diarrhea  . Lipitor [Atorvastatin] Other (See Comments)    Muscle pain  . Livalo [Pitavastatin] Other (See Comments)    Muscle aches  . Statins     Muscle aches    Current Outpatient Medications  Medication Sig Dispense Refill  . aspirin EC 81 MG tablet Take 81 mg by mouth daily after breakfast.    . Cholecalciferol (VITAMIN D3) 125 MCG (5000 UT) CHEW Chew 10,000 Units by mouth daily  after breakfast.    . clopidogrel (PLAVIX) 75 MG tablet Take 1 tablet (75 mg total) by mouth daily. 30 tablet 11  . lisinopril-hydrochlorothiazide (PRINZIDE,ZESTORETIC) 10-12.5 MG tablet TAKE 1 TABLET BY MOUTH DAILY. (Patient taking differently: Take 1 tablet by mouth daily after breakfast. ) 30 tablet 5  . Multiple Vitamin (MULTIVITAMIN WITH MINERALS) TABS tablet Take 1 tablet by mouth daily after breakfast.    . sildenafil (VIAGRA) 25 MG tablet Take 25 mg by mouth daily as needed for erectile dysfunction.     No current facility-administered medications for this visit.    Review of Systems  Constitutional:       Intentional weight gain  HENT: HENT negative.  Eyes: Eyes negative.  Respiratory: Respiratory negative.  Cardiovascular: Cardiovascular negative.  GI: Gastrointestinal negative.   Musculoskeletal: Musculoskeletal negative.  Skin: Skin negative.  Neurological: Neurological negative. Hematologic: Hematologic/lymphatic negative.  Psychiatric: Psychiatric negative.        Objective:  Objective  Vitals:   02/18/20 0950  BP: (!) 153/84  Pulse: 60  Resp: 20  SpO2: 98%    Physical Exam Constitutional:      Appearance: Normal appearance.  HENT:     Nose:     Comments: Wearing a mask Eyes:     Pupils: Pupils are equal, round, and reactive to light.  Cardiovascular:     Pulses:          Radial pulses are 2+ on the right side and 2+ on the left side.       Femoral pulses are 2+ on the right side and 2+ on the left side. Pulmonary:     Effort: Pulmonary effort is normal.  Abdominal:     General: Abdomen is flat.     Palpations: Abdomen is soft. There is no mass.  Musculoskeletal:        General: Normal range of motion.     Cervical back: Normal range of motion and neck supple.  Skin:    General: Skin is warm and dry.     Capillary Refill: Capillary refill takes less than 2 seconds.  Neurological:     General: No focal deficit present.     Mental Status: He is alert.  Psychiatric:        Mood and Affect: Mood normal.        Behavior: Behavior normal.        Thought Content: Thought content normal.        Judgment: Judgment normal.     Data: I have independently interpreted his abdominal duplex which demonstrates a bruit at the celiac artery with peak systolic velocity 741. The SMA proximally demonstrate 70-99% stenosis with SMA proximally peak systolic velocity 287 cm/s.     Assessment/Plan:     73 year old male status post endovascular aneurysm repair subsequently was found to have occluded SMA had lost significant weight down to 135 pounds.  He is now gained at least 25 possibly 30 pounds back.  He has no further abdominal pain.  By duplex today his SMA stent does appear to have recurrent stenosis we also reviewed his CT angio from March which  demonstrates that his endovascular aneurysm repair is patent without any evidence of endoleak the SMA stent appears possibly occluded with heavy calcification.  I discussed with him the results of this and given that he is asymptomatic at this time we will not pursue any further intervention particularly because there was significant difficulty even placing the stent initially.  If he  does have any abdominal pain he will call our office to be seen urgently we could consider repeat stenting possibly with a covered stent this time.  Otherwise he would be looking at mesenteric bypass but at this time he has no symptoms we will follow him up in 1 year with repeat endovascular aneurysm duplex and mesenteric duplex at that time.    Waynetta Sandy MD [facility]

## 2020-08-16 ENCOUNTER — Other Ambulatory Visit: Payer: Self-pay | Admitting: Vascular Surgery

## 2020-11-08 ENCOUNTER — Other Ambulatory Visit: Payer: Self-pay

## 2020-11-08 DIAGNOSIS — I714 Abdominal aortic aneurysm, without rupture, unspecified: Secondary | ICD-10-CM

## 2020-11-08 DIAGNOSIS — K551 Chronic vascular disorders of intestine: Secondary | ICD-10-CM

## 2021-02-16 ENCOUNTER — Other Ambulatory Visit: Payer: Self-pay

## 2021-02-16 DIAGNOSIS — K551 Chronic vascular disorders of intestine: Secondary | ICD-10-CM

## 2021-02-16 DIAGNOSIS — I714 Abdominal aortic aneurysm, without rupture, unspecified: Secondary | ICD-10-CM

## 2021-02-21 ENCOUNTER — Ambulatory Visit (HOSPITAL_COMMUNITY)
Admission: RE | Admit: 2021-02-21 | Discharge: 2021-02-21 | Disposition: A | Discharge status: Home / Self Care | Payer: Medicare Other | Source: Ambulatory Visit | Attending: Vascular Surgery | Admitting: Vascular Surgery

## 2021-02-21 ENCOUNTER — Other Ambulatory Visit: Payer: Self-pay

## 2021-02-21 ENCOUNTER — Ambulatory Visit: Payer: Medicare Other | Admitting: Vascular Surgery

## 2021-02-21 ENCOUNTER — Ambulatory Visit (INDEPENDENT_AMBULATORY_CARE_PROVIDER_SITE_OTHER)
Admission: RE | Admit: 2021-02-21 | Discharge: 2021-02-21 | Disposition: A | Discharge status: Home / Self Care | Payer: Medicare Other | Source: Ambulatory Visit | Attending: Vascular Surgery | Admitting: Vascular Surgery

## 2021-02-21 ENCOUNTER — Encounter: Payer: Self-pay | Admitting: Vascular Surgery

## 2021-02-21 VITALS — BP 163/84 | HR 59 | Temp 97.9°F | Resp 20 | Ht 70.0 in | Wt 179.0 lb

## 2021-02-21 DIAGNOSIS — I714 Abdominal aortic aneurysm, without rupture, unspecified: Secondary | ICD-10-CM

## 2021-02-21 DIAGNOSIS — K551 Chronic vascular disorders of intestine: Secondary | ICD-10-CM | POA: Diagnosis not present

## 2021-02-21 DIAGNOSIS — Z95828 Presence of other vascular implants and grafts: Secondary | ICD-10-CM

## 2021-02-21 DIAGNOSIS — I779 Disorder of arteries and arterioles, unspecified: Secondary | ICD-10-CM

## 2021-02-21 NOTE — Progress Notes (Signed)
Patient ID: Samuel [name], male   DOB: December 03, 1945, 74 y.o.   MRN: [id number]  Reason for Consult: Follow-up   Referred by [name], MD  Subjective:     HPI:  Samuel [name] is a 75 y.o. male with remote history of left SFA stent.  He has no lower extremity symptoms at this time.  He also has history of endovascular aneurysm repair and subsequent SMA stenosis with symptomatic 50 pound weight loss.  After SMA stenting he has done very well.  He has no further abdominal pain.  At last visit he did have stenosis in his SMA stent but was asymptomatic we elected to watch.  He remains on aspirin he does not take a statin secondary to allergy.  He has no history of stroke, TIA or amaurosis his carotid arteries were checked 1 year ago noted to be 1 to 39% stenosis.  He has risk factors for vascular disease of hypertension, hyperlipidemia and former smoking history.  Past Medical History:  Diagnosis Date   AAA (abdominal aortic aneurysm) (HCC)    Carotid artery occlusion    Genital warts    history of   History of carotid stenosis    s/p right carotid endarterectomy 12/06   Hyperlipidemia    Hypertension    PAD (peripheral artery disease) (HCC)    Left leg   Family History  Problem Relation Age of Onset   Stroke Mother        deceased age 43 had her firsrt CVA age  33   Hyperlipidemia Mother    Peripheral vascular disease Father        status post leg amputation  secondary to PVD   Hyperlipidemia Father    Peripheral vascular disease Brother    Colon cancer Neg Hx    Esophageal cancer Neg Hx    Prostate cancer Neg Hx    Rectal cancer Neg Hx    Stomach cancer Neg Hx    Past Surgical History:  Procedure Laterality Date   ABDOMINAL AORTIC ENDOVASCULAR STENT GRAFT N/A 11/19/2018   Procedure: ABDOMINAL AORTIC ENDOVASCULAR STENT GRAFT;  Surgeon: Waynetta Sandy, MD;  Location: [facility];  Service: Vascular;  Laterality: N/A;   [name]   CAROTID ENDARTERECTOMY   05/2005   right   COLONOSCOPY     KNEE SURGERY  1962   Left Leg stents  2012   VISCERAL ANGIOGRAPHY N/A 06/21/2019   Procedure: VISCERAL ANGIOGRAPHY;  Surgeon: Waynetta Sandy, MD;  Location: [facility];  Service: Cardiovascular;  Laterality: N/A;   VISCERAL ARTERY INTERVENTION N/A 06/21/2019   Procedure: VISCERAL ARTERY INTERVENTION;  Surgeon: Waynetta Sandy, MD;  Location: [facility];  Service: Cardiovascular;  Laterality: N/A;  Stent to SMA    Short Social History:  Social History   Tobacco Use   Smoking status: Former   Smokeless tobacco: Never   Tobacco comments:    quit- for porlonged periods he has smoked 2-3 packs a day  Substance Use Topics   Alcohol use: Yes    Alcohol/week: 24.0 standard drinks    Types: 24 Cans of beer per week    Allergies  Allergen Reactions   Diclofenac Other (See Comments)    diarrhea   Lipitor [Atorvastatin] Other (See Comments)    Muscle pain   Livalo [Pitavastatin] Other (See Comments)    Muscle aches   Statins     Muscle aches    Current Outpatient Medications  Medication Sig Dispense Refill   aspirin EC 81 MG tablet Take 81 mg by mouth daily after breakfast.     Cholecalciferol (VITAMIN D3) 125 MCG (5000 UT) CHEW Chew 10,000 Units by mouth daily after breakfast.     clopidogrel (PLAVIX) 75 MG tablet TAKE 1 TABLET BY MOUTH EVERY DAY 90 tablet 3   lisinopril-hydrochlorothiazide (PRINZIDE,ZESTORETIC) 10-12.5 MG tablet TAKE 1 TABLET BY MOUTH DAILY. (Patient taking differently: Take 1 tablet by mouth daily after breakfast.) 30 tablet 5   Multiple Vitamin (MULTIVITAMIN WITH MINERALS) TABS tablet Take 1 tablet by mouth daily after breakfast.     sildenafil (VIAGRA) 25 MG tablet Take 25 mg by mouth daily as needed for erectile dysfunction.     No current facility-administered medications for this visit.    Review of Systems  Constitutional:       Wt stable  HENT: HENT negative.  Eyes: Eyes negative.   Respiratory: Respiratory negative.  Cardiovascular: Positive for leg swelling.  GI: Gastrointestinal negative.  Musculoskeletal: Musculoskeletal negative.  Skin: Skin negative.  Neurological: Neurological negative. Hematologic: Hematologic/lymphatic negative.  Psychiatric: Psychiatric negative.       Objective:  Objective   Vitals:   02/21/21 0851  BP: (!) 163/84  Pulse: (!) 59  Resp: 20  Temp: 97.9 F (36.6 C)  SpO2: 98%  Weight: 179 lb (81.2 kg)  Height: '5\' 10"'$  (1.778 m)   Body mass index is 25.68 kg/m.  Physical Exam HENT:     Head: Normocephalic.     Nose:     Comments: Wearing a mask Eyes:     Pupils: Pupils are equal, round, and reactive to light.  Neck:     Vascular: No carotid bruit.  Cardiovascular:     Pulses:          Femoral pulses are 2+ on the right side and 2+ on the left side. Abdominal:     General: Abdomen is flat.     Palpations: Abdomen is soft. There is no mass.     Comments: Abdominal bruit  Musculoskeletal:        General: Normal range of motion.     Right lower leg: No edema.     Left lower leg: Edema present.  Skin:    General: Skin is warm.     Capillary Refill: Capillary refill takes less than 2 seconds.  Neurological:     General: No focal deficit present.     Mental Status: He is alert.  Psychiatric:        Mood and Affect: Mood normal.        Behavior: Behavior normal.        Thought Content: Thought content normal.        Judgment: Judgment normal.    Data: Duplex Findings:  +--------------------+--------+--------+------+--------+  Mesenteric          PSV cm/sEDV cm/sPlaqueComments  +--------------------+--------+--------+------+--------+  Aorta at SMA           93                           +--------------------+--------+--------+------+--------+  Celiac Artery Origin  203                           +--------------------+--------+--------+------+--------+  SMA Origin            539                            +--------------------+--------+--------+------+--------+  SMA Proximal          464                  stent    +--------------------+--------+--------+------+--------+  SMA Mid               225                           +--------------------+--------+--------+------+--------+  SMA Distal            193                           +--------------------+--------+--------+------+--------+         Summary:  Mesenteric:  70 to 99% stenosis in the superior mesenteric artery.  SMA stent was not well visualized.   Endovascular Aortic Repair (EVAR):  +----------+----------------+-------------------+-------------------+            Diameter AP (cm)Diameter Trans (cm)Velocities (cm/sec)  +----------+----------------+-------------------+-------------------+  Aorta     3.50            4.00               42                   +----------+----------------+-------------------+-------------------+  Right Limb1.16            1.19               45                   +----------+----------------+-------------------+-------------------+  Left Limb 1.21            1.21               75                   +----------+----------------+-------------------+-------------------+         Summary:  Abdominal Aorta: Patent endovascular aneurysm repair with no evidence of  endoleak. Maximum diameter measurement is 3.50 x 4.00 cm.         Assessment/Plan:     75 year old male with above-noted vascular history.  Carotid artery checked 1 year ago can be checked again in 4 more years.  We do need to get him back to check his left SFA stent we will do this in 6 months.  He does have high-grade stenosis of his SMA stent this is asymptomatic and stable.  We will recheck his SMA stent duplex and EVAR duplex in 1 year unless he has symptoms before that.  All of his questions were answered today.     Waynetta Sandy MD [facility]  [facility]

## 2021-02-22 ENCOUNTER — Other Ambulatory Visit: Payer: Self-pay

## 2021-02-22 DIAGNOSIS — I779 Disorder of arteries and arterioles, unspecified: Secondary | ICD-10-CM

## 2021-08-22 ENCOUNTER — Encounter: Payer: Self-pay | Admitting: Physician Assistant

## 2021-08-22 ENCOUNTER — Ambulatory Visit: Payer: Medicare Other | Admitting: Physician Assistant

## 2021-08-22 ENCOUNTER — Ambulatory Visit (INDEPENDENT_AMBULATORY_CARE_PROVIDER_SITE_OTHER)
Admission: RE | Admit: 2021-08-22 | Discharge: 2021-08-22 | Disposition: A | Discharge status: Home / Self Care | Payer: Medicare Other | Source: Ambulatory Visit | Attending: Vascular Surgery | Admitting: Vascular Surgery

## 2021-08-22 ENCOUNTER — Ambulatory Visit (HOSPITAL_COMMUNITY)
Admission: RE | Admit: 2021-08-22 | Discharge: 2021-08-22 | Disposition: A | Discharge status: Home / Self Care | Payer: Medicare Other | Source: Ambulatory Visit | Attending: Vascular Surgery | Admitting: Vascular Surgery

## 2021-08-22 ENCOUNTER — Other Ambulatory Visit: Payer: Self-pay

## 2021-08-22 VITALS — BP 202/84 | HR 84 | Temp 95.2°F | Ht 70.0 in | Wt 182.3 lb

## 2021-08-22 DIAGNOSIS — I779 Disorder of arteries and arterioles, unspecified: Secondary | ICD-10-CM | POA: Insufficient documentation

## 2021-08-22 NOTE — Progress Notes (Signed)
[facility]  ?HISTORY AND PHYSICAL  ? ?History of Present Illness:  Patient is a 76 y.o. year old male who presents for evaluation of PAD with history of claudication.  He is s/p left SFA stent, Aortobiiliac endograft, SMA stent, and right CEA.  He is here today for f/u on the lower extremities.   ? He denies non healing wounds, rest pain or new symptoms of claudication.  He stays active on a daily basis.   ? He is on ASA and Plavix daily.  He has an allergy to Statins.   ? ?Past Medical History:  ?Diagnosis Date  ? AAA (abdominal aortic aneurysm) ([place])   ? Carotid artery occlusion   ? Genital warts   ? history of  ? History of carotid stenosis   ? s/p right carotid endarterectomy 12/06  ? Hyperlipidemia   ? Hypertension   ? PAD (peripheral artery disease) ([place])   ? Left leg  ? ? ?Past Surgical History:  ?Procedure Laterality Date  ? ABDOMINAL AORTIC ENDOVASCULAR STENT GRAFT N/A 11/19/2018  ? Procedure: ABDOMINAL AORTIC ENDOVASCULAR STENT GRAFT;  Surgeon: Waynetta Sandy, MD;  Location: [place];  Service: Vascular;  Laterality: N/A;  ? APPENDECTOMY  1960  ? CAROTID ENDARTERECTOMY  05/2005  ? right  ? COLONOSCOPY    ? [place]  ? Left Leg stents  2012  ? VISCERAL ANGIOGRAPHY N/A 06/21/2019  ? Procedure: VISCERAL ANGIOGRAPHY;  Surgeon: Waynetta Sandy, MD;  Location: [facility];  Service: Cardiovascular;  Laterality: N/A;  ? VISCERAL ARTERY INTERVENTION N/A 06/21/2019  ? Procedure: VISCERAL ARTERY INTERVENTION;  Surgeon: Waynetta Sandy, MD;  Location: [facility];  Service: Cardiovascular;  Laterality: N/A;  Stent to SMA  ? ? ?ROS:  ? ?General:  No weight loss, Fever, chills ? ?HEENT: No recent headaches, no nasal bleeding, no visual changes, no sore throat ? ?Neurologic: No dizziness, blackouts, seizures. No recent symptoms of stroke or mini- stroke. No recent episodes of slurred speech, or temporary blindness. ? ?Cardiac: No recent  episodes of chest pain/pressure, no shortness of breath at rest.  No shortness of breath with exertion.  Denies history of atrial fibrillation or irregular heartbeat ? ?Vascular: No history of rest pain in feet.  No history of claudication.  No history of non-healing ulcer, No history of DVT  ? ?Pulmonary: No home oxygen, no productive cough, no hemoptysis,  No asthma or wheezing ? ?Musculoskeletal:  '[ ]'$  Arthritis, '[ ]'$  Low back pain,  '[ ]'$  Joint pain ? ?Hematologic:No history of hypercoagulable state.  No history of easy bleeding.  No history of anemia ? ?Gastrointestinal: No hematochezia or melena,  No gastroesophageal reflux, no trouble swallowing ? ?Urinary: '[ ]'$  chronic Kidney disease, '[ ]'$  on HD - '[ ]'$  MWF or '[ ]'$  TTHS, '[ ]'$  Burning with urination, '[ ]'$  Frequent urination, '[ ]'$  Difficulty urinating;  ? ?Skin: No rashes ? ?Psychological: No history of anxiety,  No history of depression ? ?Social History ?Social History  ? ?Tobacco Use  ? Smoking status: Former  ? Smokeless tobacco: Never  ? Tobacco comments:  ?  quit- for porlonged periods he has smoked 2-3 packs a day  ?Vaping Use  ? Vaping Use: Never used  ?Substance Use Topics  ? Alcohol use: Yes  ?  Alcohol/week: 24.0 standard drinks  ?  Types: 24 Cans of beer per week  ? Drug use: No  ? ? ?Family  History ?Family History  ?Problem Relation Age of Onset  ? Stroke Mother   ?     deceased age 37 had her firsrt CVA age  40  ? Hyperlipidemia Mother   ? Peripheral vascular disease Father   ?     status post leg amputation  secondary to PVD  ? Hyperlipidemia Father   ? Peripheral vascular disease Brother   ? Colon cancer Neg Hx   ? Esophageal cancer Neg Hx   ? Prostate cancer Neg Hx   ? Rectal cancer Neg Hx   ? Stomach cancer Neg Hx   ? ? ?Allergies ? ?Allergies  ?Allergen Reactions  ? Diclofenac Other (See Comments)  ?  diarrhea  ? Lipitor [Atorvastatin] Other (See Comments)  ?  Muscle pain  ? Livalo [Pitavastatin] Other (See Comments)  ?  Muscle aches  ? Statins   ?   Muscle aches  ? ? ? ?Current Outpatient Medications  ?Medication Sig Dispense Refill  ? aspirin EC 81 MG tablet Take 81 mg by mouth daily after breakfast.    ? Cholecalciferol (VITAMIN D3) 125 MCG (5000 UT) CHEW Chew 10,000 Units by mouth daily after breakfast.    ? clopidogrel (PLAVIX) 75 MG tablet TAKE 1 TABLET BY MOUTH EVERY DAY 90 tablet 3  ? lisinopril-hydrochlorothiazide (PRINZIDE,ZESTORETIC) 10-12.5 MG tablet TAKE 1 TABLET BY MOUTH DAILY. (Patient taking differently: Take 1 tablet by mouth daily after breakfast.) 30 tablet 5  ? Multiple Vitamin (MULTIVITAMIN WITH MINERALS) TABS tablet Take 1 tablet by mouth daily after breakfast.    ? sildenafil (VIAGRA) 25 MG tablet Take 25 mg by mouth daily as needed for erectile dysfunction.    ? ?No current facility-administered medications for this visit.  ? ? ?Physical Examination ? ?Vitals:  ? 08/22/21 1154  ?BP: (!) 202/84  ?Pulse: 84  ?Temp: (!) 95.2 ?F (35.1 ?C)  ?Weight: 182 lb 4.8 oz (82.7 kg)  ?Height: '5\' 10"'$  (1.778 m)  ? ? ?Body mass index is 26.16 kg/m?. ? ?General:  Alert and oriented, no acute distress ?HEENT: Normal ?Neck: No bruit or JVD ?Pulmonary: Clear to auscultation bilaterally ?Cardiac: Regular Rate and Rhythm without murmur ?Abdomen: Soft, non-tender, non-distended, no mass, no scars ?Skin: No rash ?Extremity Pulses:  2+ radial,  femoral pulses bilaterally ?Musculoskeletal: No deformity or edema  ?Neurologic: Upper and lower extremity motor 5/5 and symmetric ? ?DATA:  ? ? ?+-----------+--------+-----+---------------+----------+------------------+  ?RIGHT      PSV cm/sRatioStenosis       Waveform  Comments            ?+-----------+--------+-----+---------------+----------+------------------+  ?CFA Distal 121                         biphasic                      ?+-----------+--------+-----+---------------+----------+------------------+  ?DFA        276          50-74% stenosisbiphasic                       ?+-----------+--------+-----+---------------+----------+------------------+  ?SFA Prox   130                         biphasic                      ?+-----------+--------+-----+---------------+----------+------------------+  ?SFA Mid    31  biphasic  Collateral present  ?+-----------+--------+-----+---------------+----------+------------------+  ?SFA Distal              occluded                                     ?+-----------+--------+-----+---------------+----------+------------------+  ?POP Prox                occluded                                     ?+-----------+--------+-----+---------------+----------+------------------+  ?POP Distal              occluded                                     ?+-----------+--------+-----+---------------+----------+------------------+  ?TP Trunk                occluded                                     ?+-----------+--------+-----+---------------+----------+------------------+  ?ATA Distal 12                          monophasic                    ?+-----------+--------+-----+---------------+----------+------------------+  ?PTA Distal 27                          monophasic                    ?+-----------+--------+-----+---------------+----------+------------------+  ?PERO Distal19                          monophasic                    ?+-----------+--------+-----+---------------+----------+------------------+  ? ? ? ?   ? ?+-----------+--------+-----+--------+----------+--------+  ?LEFT       PSV cm/sRatioStenosisWaveform  Comments  ?+-----------+--------+-----+--------+----------+--------+  ?EIA Distal 133                  biphasic            ?+-----------+--------+-----+--------+----------+--------+  ?CFA Distal 57                   biphasic            ?+-----------+--------+-----+--------+----------+--------+  ?DFA        124                  biphasic             ?+-----------+--------+-----+--------+----------+--------+  ?SFA Prox   108                  biphasic            ?+-----------+--------+-----+--------+----------+--------+  ?SFA Mid                                   Stent     ?+-----------+--------+-----+--------+----------+--------+  ?SFA Distal  Stent

## 2021-08-27 ENCOUNTER — Other Ambulatory Visit: Payer: Self-pay | Admitting: *Deleted

## 2021-08-27 DIAGNOSIS — I6523 Occlusion and stenosis of bilateral carotid arteries: Secondary | ICD-10-CM

## 2021-08-27 DIAGNOSIS — K551 Chronic vascular disorders of intestine: Secondary | ICD-10-CM

## 2021-08-27 DIAGNOSIS — I779 Disorder of arteries and arterioles, unspecified: Secondary | ICD-10-CM

## 2021-11-01 ENCOUNTER — Encounter: Payer: Self-pay | Admitting: Emergency Medicine

## 2021-11-01 ENCOUNTER — Ambulatory Visit
Admission: EM | Admit: 2021-11-01 | Discharge: 2021-11-01 | Disposition: A | Discharge status: Home / Self Care | Payer: Medicare Other | Attending: Physician Assistant | Admitting: Physician Assistant

## 2021-11-01 DIAGNOSIS — I10 Essential (primary) hypertension: Secondary | ICD-10-CM | POA: Diagnosis not present

## 2021-11-01 DIAGNOSIS — S0502XA Injury of conjunctiva and corneal abrasion without foreign body, left eye, initial encounter: Secondary | ICD-10-CM

## 2021-11-01 MED ORDER — ERYTHROMYCIN 5 MG/GM OP OINT: TOPICAL_OINTMENT | OPHTHALMIC | 0 refills | Status: DC

## 2021-11-01 NOTE — ED Provider Notes (Signed)
[facility]    CSN: [id number] Arrival date & time: 11/01/21  1130      History   Chief Complaint Chief Complaint  Patient presents with   Foreign Body in Eye    HPI Samuel [name] is a 76 y.o. male.   Patient presents today with a several hour history of left eye pain.  He reports that he was working on removing some lines from his house when something flew off and hit his eye.  His wife washed his eye several times with eyewash and a small piece of debris was removed.  He continues to have pain and foreign body sensation.  He does not wear glasses or contacts at this time; had cataract surgery a few years ago.  He denies any chemical exposure.  He denies ocular pain, headache, vision change, dizziness, nausea, vomiting.  Blood pressure is elevated today.  Patient reports that it has been labile recently and he is working with his PCP at the [facility] to manage this appropriately.  He is monitoring this at home and reports generally does better when he is at home.  He denies any chest pain, shortness of breath, headache, vision change, dizziness.  He does not take NSAIDs and avoids decongestants.   Past Medical History:  Diagnosis Date   AAA (abdominal aortic aneurysm) ([place])    Carotid artery occlusion    Genital warts    history of   History of carotid stenosis    s/p right carotid endarterectomy 12/06   Hyperlipidemia    Hypertension    PAD (peripheral artery disease) ([facility])    Left leg    Patient Active Problem List   Diagnosis Date Noted   AAA (abdominal aortic aneurysm) ([place]) 11/19/2018   History of gout 10/03/2017   Pain of toe of right foot 10/03/2017   Aftercare following surgery of the circulatory system, NEC 03/02/2013   Proximal muscle weakness 09/10/2012   Occlusion and stenosis of carotid artery without mention of cerebral infarction 02/27/2012   Abdominal aneurysm without mention of rupture 02/27/2012   Acute gouty arthritis 09/27/2010    DIABETES MELLITUS, TYPE II, BORDERLINE 04/17/2009   CONDYLOMA ACUMINATUM 10/16/2007   Hyperlipidemia 10/16/2007   ALCOHOL USE 10/16/2007   Essential hypertension 10/16/2007   Peripheral vascular disease ([place]) 10/16/2007   CAROTID BRUIT, RIGHT 10/16/2007    Past Surgical History:  Procedure Laterality Date   ABDOMINAL AORTIC ENDOVASCULAR STENT GRAFT N/A 11/19/2018   Procedure: ABDOMINAL AORTIC ENDOVASCULAR STENT GRAFT;  Surgeon: Waynetta Sandy, MD;  Location: [facility];  Service: Vascular;  Laterality: N/A;   [place]  05/2005   right   COLONOSCOPY     KNEE SURGERY  1962   Left Leg stents  2012   VISCERAL ANGIOGRAPHY N/A 06/21/2019   Procedure: VISCERAL ANGIOGRAPHY;  Surgeon: Waynetta Sandy, MD;  Location: [facility];  Service: Cardiovascular;  Laterality: N/A;   VISCERAL ARTERY INTERVENTION N/A 06/21/2019   Procedure: VISCERAL ARTERY INTERVENTION;  Surgeon: Waynetta Sandy, MD;  Location: [facility];  Service: Cardiovascular;  Laterality: N/A;  Stent to SMA       Home Medications    Prior to Admission medications   Medication Sig Start Date End Date Taking? Authorizing Provider  aspirin EC 81 MG tablet Take 81 mg by mouth daily after breakfast.   Yes [provider]  Cholecalciferol (VITAMIN D3) 125 MCG (5000 UT) CHEW Chew 10,000  Units by mouth daily after breakfast.   Yes [provider]  clopidogrel (PLAVIX) 75 MG tablet TAKE 1 TABLET BY MOUTH EVERY DAY 08/16/20  Yes Waynetta Sandy, MD  erythromycin ophthalmic ointment Place a 1/2 inch ribbon of ointment into the lower eyelid left eye twice daily for 7 days 11/01/21  Yes Copeland Lapier K, PA-C  lisinopril-hydrochlorothiazide (PRINZIDE,ZESTORETIC) 10-12.5 MG tablet TAKE 1 TABLET BY MOUTH DAILY. Patient taking differently: Take 1 tablet by mouth daily after breakfast. 08/17/15  Yes Yoo, Doe-Hyun R, DO  Multiple Vitamin  (MULTIVITAMIN WITH MINERALS) TABS tablet Take 1 tablet by mouth daily after breakfast.   Yes [provider]  sildenafil (VIAGRA) 25 MG tablet Take 25 mg by mouth daily as needed for erectile dysfunction.   Yes [provider]    Family History Family History  Problem Relation Age of Onset   Stroke Mother        deceased age 21 had her firsrt CVA age  71   Hyperlipidemia Mother    Peripheral vascular disease Father        status post leg amputation  secondary to PVD   Hyperlipidemia Father    Peripheral vascular disease Brother    Colon cancer Neg Hx    Esophageal cancer Neg Hx    Prostate cancer Neg Hx    Rectal cancer Neg Hx    Stomach cancer Neg Hx     Social History Social History   Tobacco Use   Smoking status: Former   Smokeless tobacco: Never   Tobacco comments:    quit- for porlonged periods he has smoked 2-3 packs a day  Vaping Use   Vaping Use: Never used  Substance Use Topics   Alcohol use: Yes    Alcohol/week: 24.0 standard drinks    Types: 24 Cans of beer per week   Drug use: No     Allergies   Diclofenac, Lipitor [atorvastatin], Livalo [pitavastatin], and Statins   Review of Systems Review of Systems  Constitutional:  Positive for activity change. Negative for appetite change, fatigue and fever.  Eyes:  Positive for discharge and redness. Negative for photophobia, pain, itching and visual disturbance.  Respiratory:  Negative for cough and shortness of breath.   Cardiovascular:  Negative for chest pain.  Gastrointestinal:  Negative for abdominal pain, diarrhea, nausea and vomiting.  Neurological:  Negative for dizziness, light-headedness and headaches.    Physical Exam Triage Vital Signs ED Triage Vitals  Enc Vitals Group     BP 11/01/21 1259 (!) 215/86     Pulse Rate 11/01/21 1259 66     Resp 11/01/21 1259 18     Temp 11/01/21 1259 98.3 F (36.8 C)     Temp Source 11/01/21 1259 Oral     SpO2 11/01/21 1259 96 %     Weight  11/01/21 1301 175 lb (79.4 kg)     Height 11/01/21 1301 '5\' 10"'$  (1.778 m)     Head Circumference --      Peak Flow --      Pain Score 11/01/21 1301 6     Pain Loc --      Pain Edu? --      Excl. in [place]? --    No data found.  Updated Vital Signs BP (!) 199/77 (BP Location: Left Arm)   Pulse 66   Temp 98.3 F (36.8 C) (Oral)   Resp 18   Ht '5\' 10"'$  (0.109 m)   Wt 175 lb (  79.4 kg)   SpO2 96%   BMI 25.11 kg/m   Visual Acuity Right Eye Distance: 20 50 Left Eye Distance: 20 50 Bilateral Distance: 20 50  Right Eye Near:   Left Eye Near:    Bilateral Near:     Physical Exam Vitals reviewed.  Constitutional:      General: He is awake.     Appearance: Normal appearance. He is well-developed. He is not ill-appearing.     Comments: Very pleasant male appears stated age in no acute distress  HENT:     Head: Normocephalic and atraumatic.     Mouth/Throat:     Pharynx: Uvula midline. No oropharyngeal exudate or posterior oropharyngeal erythema.  Eyes:     Extraocular Movements: Extraocular movements intact.     Conjunctiva/sclera:     Right eye: Right conjunctiva is not injected.     Left eye: Left conjunctiva is injected.     Pupils: Pupils are equal, round, and reactive to light.     Left eye: Corneal abrasion present.   Cardiovascular:     Rate and Rhythm: Normal rate and regular rhythm.     Heart sounds: Normal heart sounds, S1 normal and S2 normal. No murmur heard. Pulmonary:     Effort: Pulmonary effort is normal.     Breath sounds: Normal breath sounds. No stridor. No wheezing, rhonchi or rales.     Comments: Clear to auscultation bilaterally Neurological:     Mental Status: He is alert.  Psychiatric:        Behavior: Behavior is cooperative.     UC Treatments / Results  Labs (all labs ordered are listed, but only abnormal results are displayed) Labs Reviewed - No data to display  EKG   Radiology No results found.  Procedures Procedures (including  critical care time)  Medications Ordered in UC Medications - No data to display  Initial Impression / Assessment and Plan / UC Course  I have reviewed the triage vital signs and the nursing notes.  Pertinent labs & imaging results that were available during my care of the patient were reviewed by me and considered in my medical decision making (see chart for details).     Corneal abrasion noted on fluorescein stain.  Eyelids were inverted to remove any foreign bodies and patient was encouraged to continue with regular eye wash.  We will start erythromycin ointment twice daily for symptom management.  Discussed that he should avoid to drink the medication bottle to his eye and make sure to wash hands prior to handling medication to prevent contamination.  If symptoms not improving significantly within 24 hours he is to follow-up with ophthalmologist and was given contact information for a local provider.  Discussed that if he has any worsening symptoms including visual disturbance, increased ocular pain, headache, nausea, vomiting he needs to go to the emergency room to which he expressed understanding.  Blood pressure is elevated today.  Patient denies any signs/symptoms of endorgan damage.  Recommended that he monitor this at home.  He is to follow-up with PCP as soon as possible.  Recommended he avoid NSAIDs, decongestants, caffeine, sodium.  If he has any chest pain, shortness of breath, headache, vision change, dizziness in the setting of high blood pressure he is to go to the emergency room to which he expressed understanding. Final Clinical Impressions(s) / UC Diagnoses   Final diagnoses:  Abrasion of left cornea, initial encounter  Elevated blood pressure reading with diagnosis of hypertension  Discharge Instructions      You have a scratch on your eye.  Use erythromycin ointment twice daily.  Do not touch the tip of bottle to your eye and make sure to wash your hands prior to  handling this to prevent contamination of the medicine.  You can use eyewash as previously recommended.  If your symptoms are not improving quickly please follow-up with ophthalmologist; please call to schedule appointment.  If anything worsens or you have vision change, eye pain, nausea, vomiting you need to be seen immediately.  Your blood pressure is elevated.  Please monitor this at home.  Avoid NSAIDs (aspirin, ibuprofen/Advil, naproxen/Aleve) caffeine, sodium, decongestants.  If you develop any chest pain, shortness of breath, headache, vision change in the setting of hyper pressure you need to go to the emergency room.  Follow-up with your PCP first thing next week.     ED Prescriptions     Medication Sig Dispense Auth. Provider   erythromycin ophthalmic ointment Place a 1/2 inch ribbon of ointment into the lower eyelid left eye twice daily for 7 days 3.5 g Colsen Modi K, PA-C      PDMP not reviewed this encounter.   Terrilee Croak, PA-C 11/01/21 1339

## 2021-11-01 NOTE — Discharge Instructions (Addendum)
You have a scratch on your eye.  Use erythromycin ointment twice daily.  Do not touch the tip of bottle to your eye and make sure to wash your hands prior to handling this to prevent contamination of the medicine.  You can use eyewash as previously recommended.  If your symptoms are not improving quickly please follow-up with ophthalmologist; please call to schedule appointment.  If anything worsens or you have vision change, eye pain, nausea, vomiting you need to be seen immediately.  Your blood pressure is elevated.  Please monitor this at home.  Avoid NSAIDs (aspirin, ibuprofen/Advil, naproxen/Aleve) caffeine, sodium, decongestants.  If you develop any chest pain, shortness of breath, headache, vision change in the setting of hyper pressure you need to go to the emergency room.  Follow-up with your PCP first thing next week.

## 2021-11-01 NOTE — ED Triage Notes (Signed)
Patient c/o piece of bark in his left eye from tree trimming this morning.  Patient is unsure if he scratched something in his eye.

## 2022-03-01 DIAGNOSIS — Z961 Presence of intraocular lens: Secondary | ICD-10-CM | POA: Insufficient documentation

## 2022-03-01 DIAGNOSIS — H18513 Endothelial corneal dystrophy, bilateral: Secondary | ICD-10-CM | POA: Insufficient documentation

## 2022-03-05 ENCOUNTER — Telehealth: Payer: Self-pay | Admitting: *Deleted

## 2022-03-05 ENCOUNTER — Encounter: Payer: Self-pay | Admitting: *Deleted

## 2022-03-05 NOTE — Patient Outreach (Signed)
  Care Coordination   Initial Visit Note   03/05/2022 Name: Samuel [name] MRN: [id number] DOB: [birth year]  Samuel [name] is a 76 y.o. year old male who sees [name], MD for primary care. I spoke with  [name] by phone today.  What matters to the patients health and wellness today?  "to keep doing well"    Goals Addressed               This Visit's Progress     COMPLETED: "to keep doing well" (pt-stated)        Care Coordination Interventions: Patient interviewed about adult health maintenance status including  importance of yearly Annual Wellness Visit, pt reports primary care provider Dr. [name] is at the [facility] "and makes sure this gets done" Provided education about importance of taking medications as prescribed. Patient reports he is doing well, managing well at home, his spouse assists if needed Explained care coordination program, pt agreeable to today's outreach, declines any further outreach          SDOH assessments and interventions completed:  Yes  SDOH Interventions Today    Flowsheet Row Most Recent Value  SDOH Interventions   Food Insecurity Interventions Intervention Not Indicated  Transportation Interventions Intervention Not Indicated        Care Coordination Interventions Activated:  Yes  Care Coordination Interventions:  Yes, provided   Follow up plan: No further intervention required.   Encounter Outcome:  Pt. Visit Completed   Jacqlyn Larsen [facility], BSN [facility] RN Care Coordinator [phone]

## 2022-05-14 ENCOUNTER — Encounter (HOSPITAL_COMMUNITY): Payer: Self-pay | Admitting: Internal Medicine

## 2022-05-14 ENCOUNTER — Other Ambulatory Visit: Payer: Self-pay

## 2022-05-14 ENCOUNTER — Emergency Department (HOSPITAL_COMMUNITY): Payer: Medicare Other

## 2022-05-14 ENCOUNTER — Inpatient Hospital Stay (HOSPITAL_COMMUNITY)
Admission: EM | Admit: 2022-05-14 | Discharge: 2022-05-20 | DRG: 871 | Disposition: A | Discharge status: Home Health | Payer: Medicare Other | Attending: Internal Medicine | Admitting: Internal Medicine

## 2022-05-14 DIAGNOSIS — N179 Acute kidney failure, unspecified: Secondary | ICD-10-CM | POA: Diagnosis not present

## 2022-05-14 DIAGNOSIS — E785 Hyperlipidemia, unspecified: Secondary | ICD-10-CM | POA: Diagnosis present

## 2022-05-14 DIAGNOSIS — Z95820 Peripheral vascular angioplasty status with implants and grafts: Secondary | ICD-10-CM | POA: Diagnosis not present

## 2022-05-14 DIAGNOSIS — R6520 Severe sepsis without septic shock: Secondary | ICD-10-CM | POA: Diagnosis present

## 2022-05-14 DIAGNOSIS — D649 Anemia, unspecified: Secondary | ICD-10-CM | POA: Diagnosis not present

## 2022-05-14 DIAGNOSIS — A419 Sepsis, unspecified organism: Secondary | ICD-10-CM | POA: Diagnosis not present

## 2022-05-14 DIAGNOSIS — Z8249 Family history of ischemic heart disease and other diseases of the circulatory system: Secondary | ICD-10-CM

## 2022-05-14 DIAGNOSIS — Z87891 Personal history of nicotine dependence: Secondary | ICD-10-CM

## 2022-05-14 DIAGNOSIS — Z83438 Family history of other disorder of lipoprotein metabolism and other lipidemia: Secondary | ICD-10-CM

## 2022-05-14 DIAGNOSIS — E7439 Other disorders of intestinal carbohydrate absorption: Secondary | ICD-10-CM

## 2022-05-14 DIAGNOSIS — J159 Unspecified bacterial pneumonia: Secondary | ICD-10-CM | POA: Diagnosis not present

## 2022-05-14 DIAGNOSIS — Z7902 Long term (current) use of antithrombotics/antiplatelets: Secondary | ICD-10-CM

## 2022-05-14 DIAGNOSIS — I10 Essential (primary) hypertension: Secondary | ICD-10-CM | POA: Diagnosis present

## 2022-05-14 DIAGNOSIS — E8770 Fluid overload, unspecified: Secondary | ICD-10-CM | POA: Diagnosis present

## 2022-05-14 DIAGNOSIS — R059 Cough, unspecified: Secondary | ICD-10-CM | POA: Diagnosis not present

## 2022-05-14 DIAGNOSIS — Z79899 Other long term (current) drug therapy: Secondary | ICD-10-CM | POA: Diagnosis not present

## 2022-05-14 DIAGNOSIS — I6529 Occlusion and stenosis of unspecified carotid artery: Secondary | ICD-10-CM

## 2022-05-14 DIAGNOSIS — I472 Ventricular tachycardia, unspecified: Secondary | ICD-10-CM | POA: Diagnosis not present

## 2022-05-14 DIAGNOSIS — R0602 Shortness of breath: Secondary | ICD-10-CM | POA: Diagnosis not present

## 2022-05-14 DIAGNOSIS — I4891 Unspecified atrial fibrillation: Secondary | ICD-10-CM | POA: Diagnosis not present

## 2022-05-14 DIAGNOSIS — I2510 Atherosclerotic heart disease of native coronary artery without angina pectoris: Secondary | ICD-10-CM | POA: Diagnosis present

## 2022-05-14 DIAGNOSIS — R9431 Abnormal electrocardiogram [ECG] [EKG]: Secondary | ICD-10-CM | POA: Diagnosis present

## 2022-05-14 DIAGNOSIS — J9601 Acute respiratory failure with hypoxia: Secondary | ICD-10-CM | POA: Diagnosis not present

## 2022-05-14 DIAGNOSIS — N400 Enlarged prostate without lower urinary tract symptoms: Secondary | ICD-10-CM | POA: Diagnosis present

## 2022-05-14 DIAGNOSIS — J9621 Acute and chronic respiratory failure with hypoxia: Secondary | ICD-10-CM | POA: Diagnosis not present

## 2022-05-14 DIAGNOSIS — I739 Peripheral vascular disease, unspecified: Secondary | ICD-10-CM | POA: Diagnosis present

## 2022-05-14 DIAGNOSIS — J189 Pneumonia, unspecified organism: Secondary | ICD-10-CM

## 2022-05-14 DIAGNOSIS — I700 Atherosclerosis of aorta: Secondary | ICD-10-CM | POA: Diagnosis not present

## 2022-05-14 DIAGNOSIS — F40240 Claustrophobia: Secondary | ICD-10-CM | POA: Diagnosis present

## 2022-05-14 DIAGNOSIS — K551 Chronic vascular disorders of intestine: Secondary | ICD-10-CM

## 2022-05-14 DIAGNOSIS — J90 Pleural effusion, not elsewhere classified: Secondary | ICD-10-CM | POA: Diagnosis not present

## 2022-05-14 DIAGNOSIS — R0902 Hypoxemia: Secondary | ICD-10-CM | POA: Diagnosis not present

## 2022-05-14 DIAGNOSIS — R7989 Other specified abnormal findings of blood chemistry: Secondary | ICD-10-CM | POA: Diagnosis present

## 2022-05-14 DIAGNOSIS — Z7982 Long term (current) use of aspirin: Secondary | ICD-10-CM

## 2022-05-14 DIAGNOSIS — Z20822 Contact with and (suspected) exposure to covid-19: Secondary | ICD-10-CM | POA: Diagnosis not present

## 2022-05-14 DIAGNOSIS — R609 Edema, unspecified: Secondary | ICD-10-CM | POA: Diagnosis not present

## 2022-05-14 DIAGNOSIS — M609 Myositis, unspecified: Secondary | ICD-10-CM | POA: Insufficient documentation

## 2022-05-14 DIAGNOSIS — Z823 Family history of stroke: Secondary | ICD-10-CM

## 2022-05-14 DIAGNOSIS — R042 Hemoptysis: Secondary | ICD-10-CM | POA: Diagnosis not present

## 2022-05-14 DIAGNOSIS — I161 Hypertensive emergency: Secondary | ICD-10-CM | POA: Diagnosis not present

## 2022-05-14 DIAGNOSIS — I480 Paroxysmal atrial fibrillation: Secondary | ICD-10-CM | POA: Diagnosis present

## 2022-05-14 DIAGNOSIS — D126 Benign neoplasm of colon, unspecified: Secondary | ICD-10-CM | POA: Insufficient documentation

## 2022-05-14 DIAGNOSIS — I160 Hypertensive urgency: Secondary | ICD-10-CM | POA: Diagnosis not present

## 2022-05-14 HISTORY — DX: Other disorders of intestinal carbohydrate absorption: E74.39

## 2022-05-14 HISTORY — DX: Occlusion and stenosis of unspecified carotid artery: I65.29

## 2022-05-14 LAB — CBC WITH DIFFERENTIAL/PLATELET
Abs Immature Granulocytes: 0.05 10*3/uL (ref 0.00–0.07)
Basophils Absolute: 0 10*3/uL (ref 0.0–0.1)
Basophils Relative: 0 %
Eosinophils Absolute: 0 10*3/uL (ref 0.0–0.5)
Eosinophils Relative: 0 %
HCT: 34.6 % — ABNORMAL LOW (ref 39.0–52.0)
Hemoglobin: 11.6 g/dL — ABNORMAL LOW (ref 13.0–17.0)
Immature Granulocytes: 0 %
Lymphocytes Relative: 8 %
Lymphs Abs: 0.9 10*3/uL (ref 0.7–4.0)
MCH: 33.1 pg (ref 26.0–34.0)
MCHC: 33.5 g/dL (ref 30.0–36.0)
MCV: 98.9 fL (ref 80.0–100.0)
Monocytes Absolute: 1.4 10*3/uL — ABNORMAL HIGH (ref 0.1–1.0)
Monocytes Relative: 11 %
Neutro Abs: 10.2 10*3/uL — ABNORMAL HIGH (ref 1.7–7.7)
Neutrophils Relative %: 81 %
Platelets: 197 10*3/uL (ref 150–400)
RBC: 3.5 MIL/uL — ABNORMAL LOW (ref 4.22–5.81)
RDW: 12.9 % (ref 11.5–15.5)
WBC: 12.6 10*3/uL — ABNORMAL HIGH (ref 4.0–10.5)
nRBC: 0 % (ref 0.0–0.2)

## 2022-05-14 LAB — BASIC METABOLIC PANEL
Anion gap: 9 (ref 5–15)
BUN: 21 mg/dL (ref 8–23)
CO2: 24 mmol/L (ref 22–32)
Calcium: 9.3 mg/dL (ref 8.9–10.3)
Chloride: 104 mmol/L (ref 98–111)
Creatinine, Ser: 1.29 mg/dL — ABNORMAL HIGH (ref 0.61–1.24)
GFR, Estimated: 57 mL/min — ABNORMAL LOW (ref >60)
Glucose, Bld: 117 mg/dL — ABNORMAL HIGH (ref 70–99)
Potassium: 4 mmol/L (ref 3.5–5.1)
Sodium: 137 mmol/L (ref 135–145)

## 2022-05-14 LAB — MRSA NEXT GEN BY PCR, NASAL: MRSA by PCR Next Gen: NOT DETECTED

## 2022-05-14 LAB — RESP PANEL BY RT-PCR (FLU A&B, COVID) ARPGX2
Influenza A by PCR: NEGATIVE
Influenza B by PCR: NEGATIVE
SARS Coronavirus 2 by RT PCR: NEGATIVE

## 2022-05-14 LAB — LACTIC ACID, PLASMA
Lactic Acid, Venous: 1.2 mmol/L (ref 0.5–1.9)
Lactic Acid, Venous: 1.4 mmol/L (ref 0.5–1.9)

## 2022-05-14 LAB — D-DIMER, QUANTITATIVE: D-Dimer, Quant: 0.84 ug/mL-FEU — ABNORMAL HIGH (ref 0.00–0.50)

## 2022-05-14 LAB — TROPONIN I (HIGH SENSITIVITY)
Troponin I (High Sensitivity): 257 ng/L (ref <18)
Troponin I (High Sensitivity): 386 ng/L (ref <18)

## 2022-05-14 LAB — BRAIN NATRIURETIC PEPTIDE: B Natriuretic Peptide: 951.2 pg/mL — ABNORMAL HIGH (ref 0.0–100.0)

## 2022-05-14 MED ORDER — ONDANSETRON HCL 4 MG/2ML IJ SOLN: 4.0000 mg | Freq: Four times a day (QID) | INTRAMUSCULAR | Status: DC | PRN

## 2022-05-14 MED ORDER — LISINOPRIL 10 MG PO TABS
10.0000 mg | ORAL_TABLET | Freq: Every day | ORAL | Status: DC
  Administered 2022-05-14 – 2022-05-15 (×2): 10 mg via ORAL
  Filled 2022-05-14 (×2): qty 1

## 2022-05-14 MED ORDER — IPRATROPIUM-ALBUTEROL 0.5-2.5 (3) MG/3ML IN SOLN: 3.0000 mL | RESPIRATORY_TRACT | Status: DC | PRN

## 2022-05-14 MED ORDER — HYDROCHLOROTHIAZIDE 12.5 MG PO TABS
12.5000 mg | ORAL_TABLET | Freq: Every day | ORAL | Status: DC
  Administered 2022-05-14 – 2022-05-15 (×2): 12.5 mg via ORAL
  Filled 2022-05-14 (×2): qty 1

## 2022-05-14 MED ORDER — TAMSULOSIN HCL 0.4 MG PO CAPS
0.4000 mg | ORAL_CAPSULE | Freq: Every day | ORAL | Status: DC
  Administered 2022-05-14 – 2022-05-20 (×7): 0.4 mg via ORAL
  Filled 2022-05-14 (×7): qty 1

## 2022-05-14 MED ORDER — SODIUM CHLORIDE 0.9 % IV SOLN
500.0000 mg | Freq: Once | INTRAVENOUS | Status: AC
  Administered 2022-05-14: 500 mg via INTRAVENOUS
  Filled 2022-05-14: qty 5

## 2022-05-14 MED ORDER — POTASSIUM CHLORIDE CRYS ER 20 MEQ PO TBCR
40.0000 meq | EXTENDED_RELEASE_TABLET | Freq: Once | ORAL | Status: AC
  Administered 2022-05-14: 40 meq via ORAL
  Filled 2022-05-14: qty 2

## 2022-05-14 MED ORDER — LABETALOL HCL 5 MG/ML IV SOLN
20.0000 mg | Freq: Once | INTRAVENOUS | Status: AC
  Administered 2022-05-14: 20 mg via INTRAVENOUS
  Filled 2022-05-14: qty 4

## 2022-05-14 MED ORDER — LISINOPRIL-HYDROCHLOROTHIAZIDE 10-12.5 MG PO TABS: 1.0000 | ORAL_TABLET | Freq: Every day | ORAL | Status: DC

## 2022-05-14 MED ORDER — CHLORHEXIDINE GLUCONATE CLOTH 2 % EX PADS
6.0000 | MEDICATED_PAD | Freq: Every day | CUTANEOUS | Status: DC
  Administered 2022-05-14 – 2022-05-16 (×3): 6 via TOPICAL

## 2022-05-14 MED ORDER — SODIUM CHLORIDE 0.9 % IV SOLN: 1.0000 g | Freq: Once | INTRAVENOUS | Status: DC

## 2022-05-14 MED ORDER — SODIUM CHLORIDE 0.9 % IV SOLN
2.0000 g | Freq: Once | INTRAVENOUS | Status: AC
  Administered 2022-05-14: 2 g via INTRAVENOUS
  Filled 2022-05-14: qty 20

## 2022-05-14 MED ORDER — LISINOPRIL 10 MG PO TABS: 10.0000 mg | ORAL_TABLET | Freq: Every day | ORAL | Status: DC

## 2022-05-14 MED ORDER — FUROSEMIDE 10 MG/ML IJ SOLN
40.0000 mg | Freq: Once | INTRAMUSCULAR | Status: AC
  Administered 2022-05-14: 40 mg via INTRAVENOUS
  Filled 2022-05-14: qty 4

## 2022-05-14 MED ORDER — ORAL CARE MOUTH RINSE: 15.0000 mL | OROMUCOSAL | Status: DC | PRN

## 2022-05-14 MED ORDER — IPRATROPIUM-ALBUTEROL 0.5-2.5 (3) MG/3ML IN SOLN
3.0000 mL | Freq: Once | RESPIRATORY_TRACT | Status: AC
  Administered 2022-05-14: 3 mL via RESPIRATORY_TRACT
  Filled 2022-05-14: qty 3

## 2022-05-14 MED ORDER — HYDRALAZINE HCL 20 MG/ML IJ SOLN
15.0000 mg | Freq: Once | INTRAMUSCULAR | Status: AC
  Administered 2022-05-14: 15 mg via INTRAVENOUS
  Filled 2022-05-14: qty 1

## 2022-05-14 MED ORDER — ORAL CARE MOUTH RINSE
15.0000 mL | OROMUCOSAL | Status: DC
  Administered 2022-05-15 – 2022-05-20 (×17): 15 mL via OROMUCOSAL

## 2022-05-14 MED ORDER — MAGNESIUM SULFATE 2 GM/50ML IV SOLN
2.0000 g | Freq: Once | INTRAVENOUS | Status: AC
  Administered 2022-05-14: 2 g via INTRAVENOUS
  Filled 2022-05-14: qty 50

## 2022-05-14 MED ORDER — MORPHINE SULFATE (PF) 2 MG/ML IV SOLN
2.0000 mg | Freq: Once | INTRAVENOUS | Status: AC
  Administered 2022-05-14: 2 mg via INTRAVENOUS
  Filled 2022-05-14: qty 1

## 2022-05-14 MED ORDER — ACETAMINOPHEN 650 MG RE SUPP: 650.0000 mg | Freq: Four times a day (QID) | RECTAL | Status: DC | PRN

## 2022-05-14 MED ORDER — CLOPIDOGREL BISULFATE 75 MG PO TABS
75.0000 mg | ORAL_TABLET | Freq: Every morning | ORAL | Status: DC
  Administered 2022-05-14 – 2022-05-20 (×7): 75 mg via ORAL
  Filled 2022-05-14 (×7): qty 1

## 2022-05-14 MED ORDER — SODIUM CHLORIDE 0.9 % IV SOLN
2.0000 g | INTRAVENOUS | Status: AC
  Administered 2022-05-15 – 2022-05-19 (×5): 2 g via INTRAVENOUS
  Filled 2022-05-14 (×4): qty 20

## 2022-05-14 MED ORDER — ONDANSETRON HCL 4 MG PO TABS: 4.0000 mg | ORAL_TABLET | Freq: Four times a day (QID) | ORAL | Status: DC | PRN

## 2022-05-14 MED ORDER — HYDRALAZINE HCL 50 MG PO TABS
50.0000 mg | ORAL_TABLET | Freq: Three times a day (TID) | ORAL | Status: DC
  Administered 2022-05-14 – 2022-05-17 (×10): 50 mg via ORAL
  Filled 2022-05-14: qty 1
  Filled 2022-05-14 (×4): qty 2
  Filled 2022-05-14 (×2): qty 1
  Filled 2022-05-14 (×2): qty 2
  Filled 2022-05-14: qty 1

## 2022-05-14 MED ORDER — SODIUM CHLORIDE 0.9 % IV SOLN
500.0000 mg | INTRAVENOUS | Status: AC
  Administered 2022-05-15 – 2022-05-19 (×5): 500 mg via INTRAVENOUS
  Filled 2022-05-14 (×5): qty 5

## 2022-05-14 MED ORDER — NITROGLYCERIN 2 % TD OINT
1.0000 [in_us] | TOPICAL_OINTMENT | Freq: Four times a day (QID) | TRANSDERMAL | Status: DC
  Administered 2022-05-14: 1 [in_us] via TOPICAL
  Filled 2022-05-14: qty 1

## 2022-05-14 MED ORDER — ACETAMINOPHEN 325 MG PO TABS: 650.0000 mg | ORAL_TABLET | Freq: Four times a day (QID) | ORAL | Status: DC | PRN

## 2022-05-14 MED ORDER — IOHEXOL 350 MG/ML SOLN
80.0000 mL | Freq: Once | INTRAVENOUS | Status: AC | PRN
  Administered 2022-05-14: 80 mL via INTRAVENOUS

## 2022-05-14 MED ORDER — HYDROCHLOROTHIAZIDE 12.5 MG PO TABS: 12.5000 mg | ORAL_TABLET | Freq: Every day | ORAL | Status: DC

## 2022-05-14 NOTE — ED Notes (Signed)
Pt sat up in bed, diaphoretic stating "I'm going crazy, I have to take this mask off." BIPAP removed from patient a this time. Pt placed on 15L NRB. Dr. Olevia Bowens notified.

## 2022-05-14 NOTE — ED Notes (Signed)
Attempted 2nd set a cultures with no success.

## 2022-05-14 NOTE — H&P (Signed)
History and Physical    Patient: Samuel [name] WUX:[id number] DOB: 11-24-1945 DOA: 05/14/2022 DOS: the patient was seen and examined on 05/14/2022 PCP: [name], MD  Patient coming from: Home  Chief Complaint:  Chief Complaint  Patient presents with   Shortness of Breath   HPI: Samuel [name] is a 76 y.o. male with medical history significant of AAA, alcohol use, carotid artery occlusion, coronary artery stenosis, carotid endarterectomy in 2006, peripheral arterial disease, essential hypertension, genital warts, gout, bilateral pseudophakia, glucose intolerance, hyperlipidemia, proximal muscle weakness, myositis who is coming from the urgent care center due to progressively worse dyspnea associated with productive cough that has become blood-tinged today, fatigue, malaise, chills, night sweats for the past 2 days.  In other urgent care, the patient O2 saturation was in the 70s so he was referred to the emergency department.  The patient went to see his PCP who took him off his lisinopril 10 mg/HCTZ 12.5 mg due to what was thought to be ACE inhibitor induced cough.  However, the patient stated he had not been having productive cough before.  He has had some orthopnea, bilateral lower extremity edema and dyspnea on exertion.  No chest pain or palpitations.  No dietary indiscretions with sodium or fluid intake. No abdominal pain, nausea, emesis, diarrhea, constipation, melena or hematochezia.  No flank pain, dysuria, frequency or hematuria.  No polyuria, polydipsia, polyphagia or blurred vision.   ED course: Initial vital signs were temperature 97.6 F, pulse 95, respiration 20, BP 212/97 mmHg O2 sat 83% on room air.  In addition to supplemental oxygen at 6 LPM via nasal cannula, the patient received ceftriaxone 2 g IVPB and azithromycin 500 mg IVPB.  While he was being transferred from the triage to one of the ED rooms, the patient became very dyspneic and tachypneic in the 40s requiring NRB  mask at 100%.  He denied chest pain.  I ordered furosemide 40 mg IVP.  I added hydralazine 15 mg IVP x 1, BiPAP ventilation mode and a DuoNeb.  The patient was ventilating well, but became claustrophobic.  He declined to use it with mild sedation.  I ordered morphine 2 mg IVP which helped his anxiety, but he still declined to resume BiPAP ventilation mode.  I also ordered labetalol 20 mg IVP x 1.  I made him aware that he is at high risk for ETT and mechanical ventilation.  He voiced understanding.  I made on [facility] EDP, Dr. Vanita Panda, aware of his clinical situation and refusal to use BiPAP mask.  Lab work: CBC is her white count 12.6, Mono 11.6 g/dL platelets 197.  D-dimer 0.84.  BNP 951 pg/mL.  BMP showed normal electrolytes, glucose 117 and creatinine 1.29 mg/dL his creatinine level almost 3 years ago was 0.60 mg/dL.  Imaging: One-view portable chest radiograph with bilateral lower lobe predominant airspace disease suggest think pulmonary edema or multifocal pneumonia.  CTA chest with no evidence of PE.  There was patchy bilateral airspace opacification likely multifocal pneumonia.  Small to moderate size bilateral pleural effusions.  Mediastinal adenopathy over the pretracheal, precarinal and subcarinal regions likely reactive.  There was aortic atherosclerosis and coronary artery disease.   Review of Systems: As mentioned in the history of present illness. All other systems reviewed and are negative. Past Medical History:  Diagnosis Date   AAA (abdominal aortic aneurysm) ([place])    Carotid artery occlusion    Carotid artery stenosis 05/14/2022   Jun 05, 2019 Entered By: Solon Palm  S Comment: Right carotid endarterectomy.   Essential hypertension 10/16/2007   Qualifier: Diagnosis of   By: Danelle Earthly CMA, Darlene       Genital warts    history of   Glucose intolerance 05/14/2022   History of carotid stenosis    s/p right carotid endarterectomy 12/06   Hyperlipidemia    Hypertension    PAD  (peripheral artery disease) ([place])    Left leg   Peripheral vascular disease ([place]) 10/16/2007   Centricity Description: INTERMITTENT CLAUDICATION, LEFT LEG  Qualifier: Diagnosis of   By: Danelle Earthly CMA, Darlene      Centricity Description: UNSPECIFIED PERIPHERAL VASCULAR DISEASE  Qualifier: Diagnosis of   By: Wynona Luna      Past Surgical History:  Procedure Laterality Date   ABDOMINAL AORTIC ENDOVASCULAR STENT GRAFT N/A 11/19/2018   Procedure: ABDOMINAL AORTIC ENDOVASCULAR STENT GRAFT;  Surgeon: Waynetta Sandy, MD;  Location: [facility];  Service: Vascular;  Laterality: N/A;   [place]  05/2005   right   COLONOSCOPY     KNEE SURGERY  1962   Left Leg stents  2012   VISCERAL ANGIOGRAPHY N/A 06/21/2019   Procedure: VISCERAL ANGIOGRAPHY;  Surgeon: Waynetta Sandy, MD;  Location: [facility];  Service: Cardiovascular;  Laterality: N/A;   VISCERAL ARTERY INTERVENTION N/A 06/21/2019   Procedure: VISCERAL ARTERY INTERVENTION;  Surgeon: Waynetta Sandy, MD;  Location: [facility];  Service: Cardiovascular;  Laterality: N/A;  Stent to SMA   Social History:  reports that he has quit smoking. He has never used smokeless tobacco. He reports current alcohol use of about 24.0 standard drinks of alcohol per week. He reports that he does not use drugs.  Allergies  Allergen Reactions   Diclofenac Other (See Comments)    diarrhea   Lipitor [Atorvastatin] Other (See Comments)    Muscle pain   Livalo [Pitavastatin] Other (See Comments)    Muscle aches   Statins     Muscle aches    Family History  Problem Relation Age of Onset   Stroke Mother        deceased age 57 had her firsrt CVA age  52   Hyperlipidemia Mother    Peripheral vascular disease Father        status post leg amputation  secondary to PVD   Hyperlipidemia Father    Peripheral vascular disease Brother    Colon cancer Neg Hx    Esophageal cancer Neg Hx     Prostate cancer Neg Hx    Rectal cancer Neg Hx    Stomach cancer Neg Hx     Prior to Admission medications   Medication Sig Start Date End Date Taking? Authorizing Provider  aspirin EC 81 MG tablet Take 81 mg by mouth daily after breakfast.    [provider]  Cholecalciferol (VITAMIN D3) 125 MCG (5000 UT) CHEW Chew 10,000 Units by mouth daily after breakfast.    [provider]  clopidogrel (PLAVIX) 75 MG tablet TAKE 1 TABLET BY MOUTH EVERY DAY 08/16/20   Waynetta Sandy, MD  erythromycin ophthalmic ointment Place a 1/2 inch ribbon of ointment into the lower eyelid left eye twice daily for 7 days 11/01/21   Raspet, Junie Panning K, PA-C  lisinopril-hydrochlorothiazide (PRINZIDE,ZESTORETIC) 10-12.5 MG tablet TAKE 1 TABLET BY MOUTH DAILY. Patient taking differently: Take 1 tablet by mouth daily after breakfast. 08/17/15   Shawna Orleans, Doe-Hyun R, DO  Multiple Vitamin (MULTIVITAMIN WITH  MINERALS) TABS tablet Take 1 tablet by mouth daily after breakfast.    [provider]  sildenafil (VIAGRA) 25 MG tablet Take 25 mg by mouth daily as needed for erectile dysfunction.    [provider]    Physical Exam: Vitals:   05/14/22 1200 05/14/22 1203 05/14/22 1205 05/14/22 1209  BP:   (!) 212/97   Pulse: 95     Resp:    20  Temp:   97.6 F (36.4 C)   SpO2: (!) 83%  90% 95%  Weight:  79.4 kg    Height:  '5\' 10"'$  (1.778 m)     Physical Exam Vitals and nursing note reviewed.  Constitutional:      General: He is awake. He is not in acute distress.    Appearance: He is well-developed and underweight.  HENT:     Head: Normocephalic.     Nose: No rhinorrhea.     Mouth/Throat:     Mouth: Mucous membranes are moist.  Eyes:     General: No scleral icterus.    Pupils: Pupils are equal, round, and reactive to light.  Neck:     Vascular: No JVD.  Cardiovascular:     Rate and Rhythm: Normal rate and regular rhythm.     Heart sounds: S1 normal and S2 normal.  Pulmonary:      Effort: Pulmonary effort is normal.     Breath sounds: Examination of the right-middle field reveals rales. Examination of the left-middle field reveals rales. Examination of the right-lower field reveals decreased breath sounds and rales. Examination of the left-lower field reveals decreased breath sounds and rales. Decreased breath sounds and rales present. No wheezing or rhonchi.  Abdominal:     General: Bowel sounds are normal.     Palpations: Abdomen is soft.     Tenderness: There is no abdominal tenderness.  Musculoskeletal:     Cervical back: Neck supple.     Right lower leg: 2+ Edema present.     Left lower leg: 2+ Edema present.  Skin:    General: Skin is warm and dry.  Neurological:     General: No focal deficit present.     Mental Status: He is alert and oriented to person, place, and time.  Psychiatric:        Mood and Affect: Mood is anxious.        Behavior: Behavior normal. Behavior is cooperative.   Data Reviewed:  There are no new results to review at this time.  Assessment and Plan: Principal Problem:   Acute respiratory failure with hypoxia ([place]) In the setting of:   Multifocal pneumonia  And volume overload with   Elevated brain natriuretic peptide (BNP) level And:  Pleural effusion Admit to stepdown/inpatient. Continue supplemental oxygen. Scheduled and as needed bronchodilators. Continue ceftriaxone 1 g IVPB daily. Continue azithromycin 500 mg IVPB daily. Check strep pneumoniae urinary antigen. Check sputum Gram stain, culture and sensitivity. Follow-up blood culture and sensitivity. Follow-up CBC and chemistry in the morning. Sodium and fluid restriction. Nitropaste to anterior chest wall. Resume hydralazine 50 mg p.o. daily. Continue furosemide 40 mg IVP daily. Resume lisinopril/HCTZ 10/12.5 mg p.o. daily. Monitor daily weights, intake and output. Monitor renal function electrolytes. Check echocardiogram. Cardiology consult requested via  message to [facility] [place].  Active Problems:   Abnormal EKG   Elevated troponin  Check echocardiogram. Trend troponin level.     Hyperlipidemia He is intolerant to statins. Follow-up with PCP for treatment options.  Essential hypertension Continue antihypertensives as above. Monitor blood pressure and heart rate.    Peripheral vascular disease ([place])   Carotid artery stenosis Continue clopidogrel. Follow-up with vascular surgery.    Normocytic anemia Monitor hematocrit and hemoglobin.    Glucose intolerance Carbohydrate modified diet. CBG monitoring before meals and bedtime.    Advance Care Planning:   Code Status: Prior   Consults:   Family Communication:   Severity of Illness: The appropriate patient status for this patient is INPATIENT. Inpatient status is judged to be reasonable and necessary in order to provide the required intensity of service to ensure the patient's safety. The patient's presenting symptoms, physical exam findings, and initial radiographic and laboratory data in the context of their chronic comorbidities is felt to place them at high risk for further clinical deterioration. Furthermore, it is not anticipated that the patient will be medically stable for discharge from the hospital within 2 midnights of admission.   * I certify that at the point of admission it is my clinical judgment that the patient will require inpatient hospital care spanning beyond 2 midnights from the point of admission due to high intensity of service, high risk for further deterioration and high frequency of surveillance required.*  Author: Reubin Milan, MD 05/14/2022 3:38 PM  For on call review [email or web address].   This document was prepared using Dragon voice recognition software and may contain some unintended transcription errors.

## 2022-05-14 NOTE — ED Triage Notes (Signed)
Pt BIB EMS from Urgent Care c/o shortness of breath x 2 days. Pt states that he coughed up "pink stuff" this morning. Oxygen at urgent care was in the 70's.

## 2022-05-14 NOTE — ED Provider Notes (Signed)
[name] DEPT Provider Note   CSN: [id number] Arrival date & time: 05/14/22  1154     History  Chief Complaint  Patient presents with   Shortness of Samuel [name] is a 76 y.o. male.  Pt is a 76 yo male with a pmxh significant for HLD, HTN, carotid artery stenosis, PAD, and BPH.  Pt said he has a cough for a few weeks.  He had been on lisinopril, so his pcp stopped that and put him on amlodipine and coreg.  Pt said he has been more sob for the past 2 days.  He just started the new meds on the 3rd, so thought it was because of the new meds.  Pt's cough had been dry, but he coughed up "pink stuff" this am.  He went to UC.  O2 sat on RA 70%.  RA O2 sat here 83%.  Pt required 6L before sats would go above 90.  Pt denies cp.  No f/c.  No one else sick at home.       Home Medications Prior to Admission medications   Medication Sig Start Date End Date Taking? Authorizing Provider  aspirin EC 81 MG tablet Take 81 mg by mouth daily after breakfast.    [provider]  Cholecalciferol (VITAMIN D3) 125 MCG (5000 UT) CHEW Chew 10,000 Units by mouth daily after breakfast.    [provider]  clopidogrel (PLAVIX) 75 MG tablet TAKE 1 TABLET BY MOUTH EVERY DAY 08/16/20   Waynetta Sandy, MD  erythromycin ophthalmic ointment Place a 1/2 inch ribbon of ointment into the lower eyelid left eye twice daily for 7 days 11/01/21   Raspet, Junie Panning K, PA-C  lisinopril-hydrochlorothiazide (PRINZIDE,ZESTORETIC) 10-12.5 MG tablet TAKE 1 TABLET BY MOUTH DAILY. Patient taking differently: Take 1 tablet by mouth daily after breakfast. 08/17/15   Shawna Orleans, Doe-Hyun R, DO  Multiple Vitamin (MULTIVITAMIN WITH MINERALS) TABS tablet Take 1 tablet by mouth daily after breakfast.    [provider]  sildenafil (VIAGRA) 25 MG tablet Take 25 mg by mouth daily as needed for erectile dysfunction.    [provider]    Medications Medication  Details Route Status Patient Instructions Prescription Expires Prescription Number Last Dispense Date Ordering Provider Order Date Source  AMLODIPINE BESYLATE '10MG'$  TAB TAKE ONE TABLET BY MOUTH DAILY FOR BLOOD PRESSURE ORAL ACTIVE   04/30/2023 [id number] 04/29/2022 GUHA,BHUVANA 04/29/2022 [facility]  AMLODIPINE BESYLATE '10MG'$  TAB TAKE ONE TABLET BY MOUTH DAILY FOR BLOOD PRESSURE. NOTICE INCREASE DOSE ORAL EXPIRED   01/27/2022 [id number] 01/02/2022 Norvel Richards 01/02/2022 [facility]  ASPIRIN '81MG'$  TAB,CHEWABLE CHEW ONE TABLET BY MOUTH IN THE MORNING ORAL ACTIVE         GUHA,BHUVANA 08/20/2013 [place]  CARVEDILOL '25MG'$  TAB TAKE ONE-HALF TABLET BY MOUTH TWICE A DAY FOR BLOOD PRESSURE ORAL ACTIVE   04/30/2023 [id number] 04/29/2022 GUHA,BHUVANA 04/29/2022 [place]  CLOPIDOGREL BISULFATE '75MG'$  TAB TAKE ONE TABLET BY MOUTH DAILY ORAL ACTIVE   04/30/2023 [id number] C 04/30/2022 GUHA,BHUVANA 04/30/2022 [place]  CLOPIDOGREL BISULFATE '75MG'$  TAB TAKE ONE TABLET BY MOUTH DAILY ORAL DISCONTINUED   09/18/2022 [id number] B 09/17/2021 GUHA,BHUVANA 09/17/2021 [place]  FLUOCINONIDE 0.05% CREAM,TOP APPLY SMALL AMOUNT TO AFFECTED AREA TWICE A DAY TOPICAL ACTIVE   04/30/2023 [id number] G 05/20/2022 GUHA,BHUVANA 04/30/2022 [place]  FLUOCINONIDE 0.05% CREAM,TOP APPLY SMALL AMOUNT TO AFFECTED AREA TWICE A DAY TOPICAL DISCONTINUED   09/18/2022 [id number] F 02/19/2022 GUHA,BHUVANA 09/17/2021 [place]  [facility]  FLUOCINONIDE 0.05% CREAM,TOP APPLY SMALL AMOUNT TO AFFECTED AREA TWICE A DAY TOPICAL DISCONTINUED   01/24/2022 [id number] E 07/08/2021 GUHA,BHUVANA 01/23/2021 [place]  HYDRALAZINE HCL '50MG'$  TAB TAKE ONE TABLET BY MOUTH THREE TIMES A DAY ORAL ACTIVE   05/09/2023 [id number] 05/08/2022 GUHA,BHUVANA 05/08/2022 [facility]  HYDROCHLOROTHIAZIDE 12.'5MG'$ /LISINOPRIL '10MG'$  TAB TAKE 1 TABLET BY MOUTH IN THE MORNING ORAL DISCONTINUED    01/24/2022 [id number] J 09/07/2021 GUHA,BHUVANA 02/09/2021 [facility]  HYDROCHLOROTHIAZIDE '25MG'$ /LISINOPRIL '20MG'$  TAB TAKE 1 TABLET BY MOUTH DAILY FOR BLOOD PRESSURE ORAL DISCONTINUED   09/18/2022 [id number] 04/01/2022 GUHA,BHUVANA 09/17/2021 [facility]  MELOXICAM 7.'5MG'$  TAB TAKE ONE TABLET BY MOUTH TWICE A DAY ORAL DISCONTINUED   01/24/2022 [id number] D 08/02/2021 GUHA,BHUVANA 02/07/2021 [place]  MENTHOL/METHYL SALICYLATE (27-25%) LOW CONC. CREAM,TOP APPLY SMALL AMOUNT TO AFFECTED AREA THREE TIMES A DAY AS NEEDED TO BOTH SHOULDERS/HIPS TOPICAL ACTIVE   04/30/2023 [id number] E 04/30/2022 GUHA,BHUVANA 04/30/2022 [place]  MENTHOL/METHYL SALICYLATE (74-25%) LOW CONC. CREAM,TOP APPLY SMALL AMOUNT TO AFFECTED AREA THREE TIMES A DAY AS NEEDED TO BOTH SHOULDERS/HIPS TOPICAL DISCONTINUED   09/18/2022 [id number] D 09/17/2021 GUHA,BHUVANA 09/17/2021 [place]  MULTIVITAMINS CAP/TAB TAKE ONE TABLET BY MOUTH IN THE MORNING ORAL ACTIVE         GUHA,BHUVANA 08/20/2013 [facility]  SILDENAFIL CITRATE '100MG'$  TAB TAKE ONE TABLET BY MOUTH AS INSTRUCTED (TAKE 1 HOUR PRIOR TO SEXUAL ACTIVITY *DO NOT EXCEED 1 DOSE PER 24 HOUR PERIOD*) ORAL ACTIVE   04/30/2023 [id number] L 04/30/2022 GUHA,BHUVANA 04/30/2022 [facility]  SILDENAFIL CITRATE '100MG'$  TAB TAKE ONE TABLET BY MOUTH AS INSTRUCTED (TAKE 1 HOUR PRIOR TO SEXUAL ACTIVITY *DO NOT EXCEED 1 DOSE PER 24 HOUR PERIOD*) ORAL DISCONTINUED   09/18/2022 [id number] K 12/28/2021 GUHA,BHUVANA 09/17/2021 [facility]  SODIUM CHLORIDE 5% OINT,OPH APPLY THIN RIBBON TO EACH EYE AT BEDTIME FOR CORNEA EACH EYE ACTIVE   03/07/2023 [id number] 03/09/2022 WERNER,RACHEL N 03/09/2022 [facility]  SODIUM CHLORIDE 5% SOLN,OPH INSTILL 1 DROP IN BOTH EYES THREE TIMES A DAY FOR CONREA BOTH EYES ACTIVE   03/07/2023 [id number] 03/09/2022 WERNER,RACHEL N 03/09/2022 [facility]  TAMSULOSIN HCL 0.'4MG'$  CAP TAKE ONE CAPSULE  BY MOUTH DAILY (DO NOT TAKE AT THE SAME TIME AS VIAGRA) ORAL ACTIVE   04/30/2023 [id number] C 04/30/2022 GUHA,BHUVANA 04/30/2022 [facility]  TAMSULOSIN HCL 0.'4MG'$  CAP TAKE ONE CAPSULE BY MOUTH DAILY (DO NOT TAKE AT THE SAME TIME AS VIAGRA) ORAL DISCONTINUED   09/18/2022 [id number] B 09/18/2021 GUHA,BHUVANA 09/18/2021 [facility]  TAMSULOSIN HCL 0.'4MG'$  CAP TAKE ONE CAPSULE BY MOUTH DAILY (DO NOT TAKE AT THE SAME TIME AS VIAGRA) ORAL DISCONTINUED   01/24/2022 [id number] A 06/20/2021 GUHA,BHUVANA 04/01/2021 [facility]  VALACYCLOVIR HCL 1GM TAB TAKE ONE TABLET BY MOUTH THREE TIMES A DAY ORAL EXPIRED   06/03/2021 [id number] 05/04/2021 GUHA,BHUVANA 05/04/2021 [facility]     Allergies    Diclofenac, Lipitor [atorvastatin], Livalo [pitavastatin], and Statins    Review of Systems   Review of Systems  Respiratory:  Positive for shortness of breath.   All other systems reviewed and are negative.   Physical Exam Updated Vital Signs BP (!) 212/97   Pulse 95   Temp 97.6 F (36.4 C)   Resp 20   Ht '5\' 10"'$  (1.778 m)   Wt 79.4 kg   SpO2 95%   BMI 25.12 kg/m  Physical Exam Vitals and nursing note reviewed.  Constitutional:      Appearance: He is well-developed.  HENT:     Head:  Normocephalic and atraumatic.     Mouth/Throat:     Mouth: Mucous membranes are moist.     Pharynx: Oropharynx is clear.  Eyes:     Extraocular Movements: Extraocular movements intact.     Pupils: Pupils are equal, round, and reactive to light.  Cardiovascular:     Rate and Rhythm: Normal rate and regular rhythm.  Pulmonary:     Effort: Pulmonary effort is normal.     Breath sounds: Rhonchi present.  Abdominal:     General: Bowel sounds are normal.     Palpations: Abdomen is soft.  Musculoskeletal:        General: Normal range of motion.     Cervical back: Normal range of motion and neck supple.  Skin:    General: Skin is warm.     Capillary Refill: Capillary refill takes less  than 2 seconds.  Neurological:     General: No focal deficit present.     Mental Status: He is alert and oriented to person, place, and time.  Psychiatric:        Mood and Affect: Mood normal.        Behavior: Behavior normal.     ED Results / Procedures / Treatments   Labs (all labs ordered are listed, but only abnormal results are displayed) Labs Reviewed  BASIC METABOLIC PANEL - Abnormal; Notable for the following components:      Result Value   Glucose, Bld 117 (*)    Creatinine, Ser 1.29 (*)    GFR, Estimated 57 (*)    All other components within normal limits  BRAIN NATRIURETIC PEPTIDE - Abnormal; Notable for the following components:   B Natriuretic Peptide 951.2 (*)    All other components within normal limits  CBC WITH DIFFERENTIAL/PLATELET - Abnormal; Notable for the following components:   WBC 12.6 (*)    RBC 3.50 (*)    Hemoglobin 11.6 (*)    HCT 34.6 (*)    Neutro Abs 10.2 (*)    Monocytes Absolute 1.4 (*)    All other components within normal limits  D-DIMER, QUANTITATIVE - Abnormal; Notable for the following components:   D-Dimer, Quant 0.84 (*)    All other components within normal limits  RESP PANEL BY RT-PCR (FLU A&B, COVID) [id number]  CULTURE, BLOOD (ROUTINE X 2)  CULTURE, BLOOD (ROUTINE X 2)  EXPECTORATED SPUTUM ASSESSMENT W GRAM STAIN, RFLX TO RESP C  LACTIC ACID, PLASMA  LACTIC ACID, PLASMA  STREP PNEUMONIAE URINARY ANTIGEN  TROPONIN I (HIGH SENSITIVITY)  TROPONIN I (HIGH SENSITIVITY)    EKG EKG Interpretation  Date/Time:  Tuesday May 14 2022 12:04:26 EST Ventricular Rate:  87 PR Interval:  154 QRS Duration: 90 QT Interval:  365 QTC Calculation: 440 R Axis:   66 Text Interpretation: Sinus rhythm Probable left atrial enlargement LVH with secondary repolarization abnormality ST depression, consider ischemia, diffuse lds Baseline wander in lead(s) V2 No significant change since last tracing Confirmed by Isla Pence [phone]) on 05/14/2022  12:27:19 PM  Radiology CT Angio Chest PE W and/or Wo Contrast  Result Date: 05/14/2022 CLINICAL DATA:  High suspicion of pulmonary embolism. EXAM: CT ANGIOGRAPHY CHEST WITH CONTRAST TECHNIQUE: Multidetector CT imaging of the chest was performed using the standard protocol during bolus administration of intravenous contrast. Multiplanar CT image reconstructions and MIPs were obtained to evaluate the vascular anatomy. RADIATION DOSE REDUCTION: This exam was performed according to the departmental dose-optimization program which includes automated exposure control, adjustment of the mA and/or  kV according to patient size and/or use of iterative reconstruction technique. CONTRAST:  75m OMNIPAQUE IOHEXOL 350 MG/ML SOLN COMPARISON:  08/24/2018 FINDINGS: Cardiovascular: Heart is normal size. There is calcified plaque over the left main and 3 vessel coronary arteries. Thoracic aorta is normal in caliber. There is calcified plaque throughout the thoracic aorta. Pulmonary arterial system is well opacified without evidence of emboli. Remaining vascular structures are unremarkable. Mediastinum/Nodes: Mediastinal adenopathy over the pretracheal, precarinal and subcarinal regions with the largest node measuring 1.3 cm by short axis over the subcarinal region. No significant hilar adenopathy. Remaining mediastinal structures are unremarkable. Lungs/Pleura: Lungs are adequately inflated and demonstrate patchy bilateral airspace opacification likely multifocal pneumonia. Small to moderate size bilateral pleural effusions are present. Airways are unremarkable. Upper Abdomen: Calcified plaque over the abdominal aorta. There is an aortic stent graft beginning just below the level of the renal arteries on the most inferior images and therefore not completely evaluated. No acute findings in the visualized abdomen. Musculoskeletal: No focal abnormality. Review of the MIP images confirms the above findings. IMPRESSION: 1. No evidence  of pulmonary embolism. 2. Patchy bilateral airspace opacification likely multifocal pneumonia. Small to moderate size bilateral pleural effusions. 3. Mediastinal adenopathy over the pretracheal, precarinal and subcarinal regions likely reactive. 4. Aortic atherosclerosis. Atherosclerotic coronary artery disease. Aortic Atherosclerosis (ICD10-I70.0). Electronically Signed   By: DMarin OlpM.D.   On: 05/14/2022 15:16   DG Chest Port 1 View  Result Date: 05/14/2022 CLINICAL DATA:  Short of breath.  Productive cough EXAM: PORTABLE CHEST 1 VIEW COMPARISON:  11/19/2018 FINDINGS: Normal cardiac silhouette. There is fine bilateral perihilar airspace disease with a lower lobe distribution. Potential small effusions. No pneumothorax. IMPRESSION: Bilateral lower lobe predominant airspace disease suggests pulmonary edema. Multifocal pneumonia less favored. Electronically Signed   By: SSuzy BouchardM.D.   On: 05/14/2022 12:51    Procedures Procedures    Medications Ordered in ED Medications  azithromycin (ZITHROMAX) 500 mg in sodium chloride 0.9 % 250 mL IVPB (has no administration in time range)  cefTRIAXone (ROCEPHIN) 2 g in sodium chloride 0.9 % 100 mL IVPB (has no administration in time range)  azithromycin (ZITHROMAX) 500 mg in sodium chloride 0.9 % 250 mL IVPB (has no administration in time range)  acetaminophen (TYLENOL) tablet 650 mg (has no administration in time range)    Or  acetaminophen (TYLENOL) suppository 650 mg (has no administration in time range)  ondansetron (ZOFRAN) tablet 4 mg (has no administration in time range)    Or  ondansetron (ZOFRAN) injection 4 mg (has no administration in time range)  ipratropium-albuterol (DUONEB) 0.5-2.5 (3) MG/3ML nebulizer solution 3 mL (has no administration in time range)  cefTRIAXone (ROCEPHIN) 2 g in sodium chloride 0.9 % 100 mL IVPB (has no administration in time range)  iohexol (OMNIPAQUE) 350 MG/ML injection 80 mL (80 mLs Intravenous  Contrast Given 05/14/22 1446)    ED Course/ Medical Decision Making/ A&P                           Medical Decision Making Amount and/or Complexity of Data Reviewed Labs: ordered. Radiology: ordered.  Risk Prescription drug management. Decision regarding hospitalization.   This patient presents to the ED for concern of sob, this involves an extensive number of treatment options, and is a complaint that carries with it a high risk of complications and morbidity.  The differential diagnosis includes covid/flu, pna, PE   Co morbidities that complicate  the patient evaluation  HLD, HTN, carotid artery stenosis, PAD, and BPH   Additional history obtained:  Additional history obtained from epic chart review External records from outside source obtained and reviewed including  EMS report   Lab Tests:  I Ordered, and personally interpreted labs.  The pertinent results include:  cbc with wbc 12.6, hgb 11.6, bmp with mild elevation in cr at 1.29; ddimer elevated at 0.84, bnp 951.2; covid/flu neg   Imaging Studies ordered:  I ordered imaging studies including cxr and ct chest  I independently visualized and interpreted imaging which showed  CXR: Bilateral lower lobe predominant airspace disease suggests pulmonary  edema. Multifocal pneumonia less favored.  CT chest: 1. No evidence of pulmonary embolism.  2. Patchy bilateral airspace opacification likely multifocal  pneumonia. Small to moderate size bilateral pleural effusions.  3. Mediastinal adenopathy over the pretracheal, precarinal and  subcarinal regions likely reactive.  4. Aortic atherosclerosis. Atherosclerotic coronary artery disease.    Aortic Atherosclerosis (ICD10-I70.0).   I agree with the radiologist interpretation   Cardiac Monitoring:  The patient was maintained on a cardiac monitor.  I personally viewed and interpreted the cardiac monitored which showed an underlying rhythm of: nsr   Medicines ordered  and prescription drug management:  I ordered medication including rocephin/zithromax  for pna  Reevaluation of the patient after these medicines showed that the patient improved I have reviewed the patients home medicines and have made adjustments as needed   Test Considered:  ct   Critical Interventions:  oxygen   Consultations Obtained:  I requested consultation with the hospitalist (Dr. Olevia Bowens),  and discussed lab and imaging findings as well as pertinent plan -he will admit  Problem List / ED Course:  Hypoxia:  pt requiring 6 L to keep O2 sats over 90%.  Pt does have multifocal pna on CT.  No PE.  Pt given rocephin/zithromax for pna.   Reevaluation:  After the interventions noted above, I reevaluated the patient and found that they have :improved   Social Determinants of Health:  Lives at home   Dispostion:  After consideration of the diagnostic results and the patients response to treatment, I feel that the patent would benefit from admission.    CRITICAL CARE Performed by: Isla Pence   Total critical care time: 30 minutes  Critical care time was exclusive of separately billable procedures and treating other patients.  Critical care was necessary to treat or prevent imminent or life-threatening deterioration.  Critical care was time spent personally by me on the following activities: development of treatment plan with patient and/or surrogate as well as nursing, discussions with consultants, evaluation of patient's response to treatment, examination of patient, obtaining history from patient or surrogate, ordering and performing treatments and interventions, ordering and review of laboratory studies, ordering and review of radiographic studies, pulse oximetry and re-evaluation of patient's condition.         Final Clinical Impression(s) / ED Diagnoses Final diagnoses:  Acute respiratory failure with hypoxia ([place])  Multifocal pneumonia    Rx / DC  Orders ED Discharge Orders     None         Isla Pence, MD 05/14/22 1605

## 2022-05-15 ENCOUNTER — Inpatient Hospital Stay (HOSPITAL_COMMUNITY): Payer: Medicare Other

## 2022-05-15 ENCOUNTER — Encounter (HOSPITAL_COMMUNITY): Payer: Self-pay | Admitting: Internal Medicine

## 2022-05-15 DIAGNOSIS — J9601 Acute respiratory failure with hypoxia: Secondary | ICD-10-CM | POA: Diagnosis not present

## 2022-05-15 DIAGNOSIS — I739 Peripheral vascular disease, unspecified: Secondary | ICD-10-CM

## 2022-05-15 DIAGNOSIS — R9431 Abnormal electrocardiogram [ECG] [EKG]: Secondary | ICD-10-CM

## 2022-05-15 LAB — COMPREHENSIVE METABOLIC PANEL
ALT: 14 U/L (ref 0–44)
AST: 17 U/L (ref 15–41)
Albumin: 3.2 g/dL — ABNORMAL LOW (ref 3.5–5.0)
Alkaline Phosphatase: 35 U/L — ABNORMAL LOW (ref 38–126)
Anion gap: 11 (ref 5–15)
BUN: 21 mg/dL (ref 8–23)
CO2: 22 mmol/L (ref 22–32)
Calcium: 8.5 mg/dL — ABNORMAL LOW (ref 8.9–10.3)
Chloride: 101 mmol/L (ref 98–111)
Creatinine, Ser: 1.56 mg/dL — ABNORMAL HIGH (ref 0.61–1.24)
GFR, Estimated: 45 mL/min — ABNORMAL LOW (ref >60)
Glucose, Bld: 123 mg/dL — ABNORMAL HIGH (ref 70–99)
Potassium: 4 mmol/L (ref 3.5–5.1)
Sodium: 134 mmol/L — ABNORMAL LOW (ref 135–145)
Total Bilirubin: 0.8 mg/dL (ref 0.3–1.2)
Total Protein: 6.2 g/dL — ABNORMAL LOW (ref 6.5–8.1)

## 2022-05-15 LAB — CBC
HCT: 31.1 % — ABNORMAL LOW (ref 39.0–52.0)
Hemoglobin: 10.3 g/dL — ABNORMAL LOW (ref 13.0–17.0)
MCH: 33.1 pg (ref 26.0–34.0)
MCHC: 33.1 g/dL (ref 30.0–36.0)
MCV: 100 fL (ref 80.0–100.0)
Platelets: 190 10*3/uL (ref 150–400)
RBC: 3.11 MIL/uL — ABNORMAL LOW (ref 4.22–5.81)
RDW: 13.1 % (ref 11.5–15.5)
WBC: 13.6 10*3/uL — ABNORMAL HIGH (ref 4.0–10.5)
nRBC: 0 % (ref 0.0–0.2)

## 2022-05-15 LAB — ECHOCARDIOGRAM COMPLETE
AR max vel: 1.91 cm2
AV Area VTI: 2.16 cm2
AV Area mean vel: 1.96 cm2
AV Mean grad: 12 mmHg
AV Peak grad: 20.3 mmHg
Ao pk vel: 2.25 m/s
Area-P 1/2: 5.79 cm2
Height: 70 in
MV VTI: 2.78 cm2
S' Lateral: 3.4 cm
Weight: 2857.16 oz

## 2022-05-15 LAB — TROPONIN I (HIGH SENSITIVITY): Troponin I (High Sensitivity): 348 ng/L (ref <18)

## 2022-05-15 MED ORDER — IRBESARTAN 150 MG PO TABS
150.0000 mg | ORAL_TABLET | Freq: Every day | ORAL | Status: DC
  Administered 2022-05-16 – 2022-05-17 (×2): 150 mg via ORAL
  Filled 2022-05-15 (×2): qty 1

## 2022-05-15 MED ORDER — NITROGLYCERIN 2 % TD OINT
1.0000 [in_us] | TOPICAL_OINTMENT | TRANSDERMAL | Status: AC
  Administered 2022-05-15: 1 [in_us] via TOPICAL
  Filled 2022-05-15: qty 1

## 2022-05-15 MED ORDER — MELATONIN 3 MG PO TABS
3.0000 mg | ORAL_TABLET | Freq: Once | ORAL | Status: AC
  Administered 2022-05-15: 3 mg via ORAL
  Filled 2022-05-15: qty 1

## 2022-05-15 NOTE — Progress Notes (Signed)
*  PRELIMINARY RESULTS* Echocardiogram 2D Echocardiogram has been performed.  Samuel [name] 05/15/2022, 3:28 PM

## 2022-05-15 NOTE — Consult Note (Signed)
Cardiology Consultation   Patient ID: Samuel [name] MRN: [id number]; DOB: [birth year]  Admit date: 05/14/2022 Date of Consult: 05/15/2022  PCP:  [name], [place] Prov[facility] Cardiologist:  None        Patient Profile:   Samuel [name] is a 76 y.o. male with a hx of PVD, s/p LSFA stent 2012, R CEA 2006,  and HTN. Intolerance to statins, hx of endovascular aneurysm repair abd in 2020, chronic mesenteric ischemia with stent to SMA-2021 who is being seen 05/15/2022 for the evaluation of abnormal EKG with elevated troponin at the request of Dr. Sloan Leiter.  History of Present Illness:   Samuel [name] with above hx of PAD, LSFA stent, mesenteric ischemia with stent to SMA and endovascular abd aneurysm repair in 2020, no cardiac cath or nuc study that I can find. Also with HTN, HLD,   Is followed at [facility] and had nuclear stress test recently that showed per pt some CAD but nothing to worry about.     Pt presented to ER by EMS from urgent care 05/14/22 with complaints of SOB for 2 days.  Had coughed up pink stuff yesterday AM and sp02 was in the 70s.  He had lower ext edema and some orthopnea, DOE, no chest pain.     BNP 951  Hs troponin 257>>386>>348 Na 134 K+ 4.0 BUN 21 Cr 1.56 was 1.29  Albumin 3.2 WBC 13.6, Hgb 10.3  plts 190  Ddimer 0.84  Negative resp panel  Blood cultures pending   EKG:  The EKG was personally reviewed and demonstrates:  SR at 51 with LVH and ST depression inf lat though this is similar to EKG 2020.  Telemetry:  Telemetry was personally reviewed and demonstrates:  SR  CTA chest   IMPRESSION: 1. No evidence of pulmonary embolism. 2. Patchy bilateral airspace opacification likely multifocal pneumonia. Small to moderate size bilateral pleural effusions. 3. Mediastinal adenopathy over the pretracheal, precarinal and subcarinal regions likely reactive. 4. Aortic atherosclerosis. Atherosclerotic coronary artery disease.   Aortic  Atherosclerosis (ICD10-I70.0).  PCXR  IMPRESSION: Bilateral lower lobe predominant airspace disease suggests pulmonary edema. Multifocal pneumonia less favored.  BP 158/59 R 28 T afebrile P 87 Feeling much better today but still not his normal. No chest pain and none prior to admit with activity.     Past Medical History:  Diagnosis Date   AAA (abdominal aortic aneurysm) ([place])    Carotid artery occlusion    Carotid artery stenosis 05/14/2022   Jun 05, 2019 Entered By: Ria Clock Comment: Right carotid endarterectomy.   Essential hypertension 10/16/2007   Qualifier: Diagnosis of   By: Danelle Earthly CMA, Darlene       Genital warts    history of   Glucose intolerance 05/14/2022   History of carotid stenosis    s/p right carotid endarterectomy 12/06   Hyperlipidemia    Hypertension    PAD (peripheral artery disease) ([place])    Left leg   Peripheral vascular disease ([place]) 10/16/2007   Centricity Description: INTERMITTENT CLAUDICATION, LEFT LEG  Qualifier: Diagnosis of   By: Danelle Earthly CMA, Darlene      Centricity Description: UNSPECIFIED PERIPHERAL VASCULAR DISEASE  Qualifier: Diagnosis of   By: Wynona Luna       Past Surgical History:  Procedure Laterality Date   ABDOMINAL AORTIC ENDOVASCULAR STENT GRAFT N/A 11/19/2018   Procedure: ABDOMINAL AORTIC ENDOVASCULAR STENT GRAFT;  Surgeon: Waynetta Sandy, MD;  Location: [place];  Service: Vascular;  Laterality: N/A;   APPENDECTOMY  1960   CAROTID ENDARTERECTOMY  05/2005   right   COLONOSCOPY     KNEE SURGERY  1962   Left Leg stents  2012   VISCERAL ANGIOGRAPHY N/A 06/21/2019   Procedure: VISCERAL ANGIOGRAPHY;  Surgeon: Waynetta Sandy, MD;  Location: [place] [facility];  Service: Cardiovascular;  Laterality: N/A;   VISCERAL ARTERY INTERVENTION N/A 06/21/2019   Procedure: VISCERAL ARTERY INTERVENTION;  Surgeon: Waynetta Sandy, MD;  Location: [place] [facility];  Service: Cardiovascular;  Laterality: N/A;   Stent to SMA     Home Medications:  Prior to Admission medications   Medication Sig Start Date End Date Taking? Authorizing Provider  Cholecalciferol (VITAMIN D3) 125 MCG (5000 UT) CHEW Chew 5,000 Units by mouth daily after breakfast.   Yes [provider]  clopidogrel (PLAVIX) 75 MG tablet TAKE 1 TABLET BY MOUTH EVERY DAY Patient taking differently: Take 75 mg by mouth in the morning. 08/16/20  Yes Waynetta Sandy, MD  fluocinonide cream (LIDEX) 9.24 % Apply 1 Application topically 2 (two) times daily as needed (to affected areas for itching/swelling).   Yes [provider]  hydrALAZINE (APRESOLINE) 50 MG tablet Take 50 mg by mouth 3 (three) times daily.   Yes [provider]  NON FORMULARY Take 1 capsule by mouth See admin instructions. Beet plus capsules- Take 1 capsule by mouth once a day   Yes [provider]  NON FORMULARY Take 1 capsule by mouth See admin instructions. Juice Plus capsules- Take 1 capsule by mouth once a day   Yes [provider]  sodium chloride (MURO 128) 5 % ophthalmic solution Place 1 drop into both eyes 3 (three) times daily.   Yes [provider]  tamsulosin (FLOMAX) 0.4 MG CAPS capsule Take 0.4 mg by mouth at bedtime.   Yes [provider]  erythromycin ophthalmic ointment Place a 1/2 inch ribbon of ointment into the lower eyelid left eye twice daily for 7 days Patient not taking: Reported on 05/14/2022 11/01/21   Raspet, Derry Skill, PA-C  lisinopril-hydrochlorothiazide (PRINZIDE,ZESTORETIC) 10-12.5 MG tablet TAKE 1 TABLET BY MOUTH DAILY. Patient not taking: Reported on 05/14/2022 08/17/15   Shawna Orleans, Doe-Hyun R, DO  sildenafil (VIAGRA) 100 MG tablet Take 100 mg by mouth daily as needed for erectile dysfunction (one hour prior to activity- max of 1 tablet/day).    [provider]    Inpatient Medications: Scheduled Meds:  Chlorhexidine Gluconate Cloth  6 each Topical Daily   clopidogrel  75 mg  Oral q AM   hydrALAZINE  50 mg Oral TID   lisinopril  10 mg Oral Daily   And   hydrochlorothiazide  12.5 mg Oral Daily   mouth rinse  15 mL Mouth Rinse 4 times per day   tamsulosin  0.4 mg Oral Daily   Continuous Infusions:  azithromycin 500 mg (05/15/22 1009)   cefTRIAXone (ROCEPHIN)  IV 2 g (05/15/22 1005)   PRN Meds: acetaminophen **OR** acetaminophen, ipratropium-albuterol, ondansetron **OR** ondansetron (ZOFRAN) IV, mouth rinse  Allergies:    Allergies  Allergen Reactions   Diclofenac Diarrhea   Lipitor [Atorvastatin] Other (See Comments)    Muscle pain   Livalo [Pitavastatin] Other (See Comments)    Muscle aches   Statins     Muscle aches    Social History:   Social History   Socioeconomic History   Marital status: Married    Spouse name: Not on file  Number of children: Not on file   Years of education: Not on file   Highest education level: Not on file  Occupational History    Employer: RETIRED    Comment: self employed - [employer]  Tobacco Use   Smoking status: Former   Smokeless tobacco: Never   Tobacco comments:    quit- for porlonged periods he has smoked 2-3 packs a day  Vaping Use   Vaping Use: Never used  Substance and Sexual Activity   Alcohol use: Yes    Alcohol/week: 24.0 standard drinks of alcohol    Types: 24 Cans of beer per week   Drug use: No   Sexual activity: Not on file  Other Topics Concern   Not on file  Social History Narrative   Married -to his second wife Idamae Schuller).  Has a daughter from his first marriage who is in her early 7s.     He drinks approximately 4 alcoholic beverages, beer, a day.  He has done so since age 50.  Denies any binge drinking or heavy alcohol use.  Quit tobacco 2 year ago.  He has smoked on and off x20-30 years.  For prolonged periods he has smoked 2-3 packs a day.     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  She lives in [place], [place]   Social  Determinants of Health   Financial Resource Strain: Not on file  Food Insecurity: No Food Insecurity (05/14/2022)   Hunger Vital Sign    Worried About Running Out of Food in the Last Year: Never true    Ran Out of Food in the Last Year: Never true  Transportation Needs: No Transportation Needs (05/14/2022)   PRAPARE - Hydrologist (Medical): No    Lack of Transportation (Non-Medical): No  Physical Activity: Not on file  Stress: Not on file  Social Connections: Not on file  Intimate Partner Violence: Not At Risk (05/14/2022)   Humiliation, Afraid, Rape, and Kick questionnaire    Fear of Current or Ex-Partner: No    Emotionally Abused: No    Physically Abused: No    Sexually Abused: No    Family History:    Family History  Problem Relation Age of Onset   Stroke Mother        deceased age 53 had her firsrt CVA age  42   Hyperlipidemia Mother    Peripheral vascular disease Father        status post leg amputation  secondary to PVD   Hyperlipidemia Father    Peripheral vascular disease Brother    Colon cancer Neg Hx    Esophageal cancer Neg Hx    Prostate cancer Neg Hx    Rectal cancer Neg Hx    Stomach cancer Neg Hx      ROS:  Please see the history of present illness.  General:no colds or fevers, no weight changes Skin:no rashes or ulcers HEENT:no blurred vision, no congestion CV:see HPI PUL:see HPI GI:no diarrhea constipation or melena, no indigestion GU:no hematuria, no dysuria MS:no joint pain, no claudication Neuro:no syncope, no lightheadedness Endo:no diabetes, no thyroid disease  All other ROS reviewed and negative.     Physical Exam/Data:   Vitals:   05/15/22 0400 05/15/22 0500 05/15/22 0700 05/15/22 0800  BP: 137/60 (!) 163/70 (!) 169/49 (!) 155/52  Pulse: 79 77 93 85  Resp: 20 (!) 21 (!) 27 (!) 22  Temp: 98 F (36.7 C)   98.3 F (36.8 C)  TempSrc: Oral   Oral  SpO2: 96% 96% 92% 94%  Weight:      Height:         Intake/Output Summary (Last 24 hours) at 05/15/2022 1125 Last data filed at 05/15/2022 0900 Gross per 24 hour  Intake --  Output 875 ml  Net -875 ml      05/14/2022    8:18 PM 05/14/2022   12:03 PM 11/01/2021    1:01 PM  Last 3 Weights  Weight (lbs) 178 lb 9.2 oz 175 lb 0.7 oz 175 lb  Weight (kg) 81 kg 79.4 kg 79.379 kg     Body mass index is 25.62 kg/m.  General:  Well nourished, well developed, in no acute distress HEENT: normal Neck: no JVD sitting up Vascular: No carotid bruits; Distal pulses 2+ bilaterally Cardiac:  normal S1, S2; RRR; no murmur gallup rub or click Lungs:  diminished throughout to auscultation bilaterally, occ wheezing, no rhonchi or rales  Abd: soft, nontender, no hepatomegaly  Ext: 1+ edema lower ext Musculoskeletal:  No deformities, BUE and BLE strength normal and equal Skin: warm and dry  Neuro:  alert and oriented X 3 MAE follows commands, no focal abnormalities noted Psych:  Normal affect   Relevant CV Studies: Echo pending  Laboratory Data:  High Sensitivity Troponin:   Recent Labs  Lab 05/14/22 1752 05/14/22 2115 05/15/22 0722  TROPONINIHS 257* 386* 348*     Chemistry Recent Labs  Lab 05/14/22 1210 05/15/22 0319  NA 137 134*  K 4.0 4.0  CL 104 101  CO2 24 22  GLUCOSE 117* 123*  BUN 21 21  CREATININE 1.29* 1.56*  CALCIUM 9.3 8.5*  GFRNONAA 57* 45*  ANIONGAP 9 11    Recent Labs  Lab 05/15/22 0319  PROT 6.2*  ALBUMIN 3.2*  AST 17  ALT 14  ALKPHOS 35*  BILITOT 0.8   Lipids No results for input(s): "CHOL", "TRIG", "HDL", "LABVLDL", "LDLCALC", "CHOLHDL" in the last 168 hours.  Hematology Recent Labs  Lab 05/14/22 1210 05/15/22 0319  WBC 12.6* 13.6*  RBC 3.50* 3.11*  HGB 11.6* 10.3*  HCT 34.6* 31.1*  MCV 98.9 100.0  MCH 33.1 33.1  MCHC 33.5 33.1  RDW 12.9 13.1  PLT 197 190   Thyroid No results for input(s): "TSH", "FREET4" in the last 168 hours.  BNP Recent Labs  Lab 05/14/22 1210  BNP 951.2*     DDimer  Recent Labs  Lab 05/14/22 1210  DDIMER 0.84*     Radiology/Studies:  CT Angio Chest PE W and/or Wo Contrast  Result Date: 05/14/2022 CLINICAL DATA:  High suspicion of pulmonary embolism. EXAM: CT ANGIOGRAPHY CHEST WITH CONTRAST TECHNIQUE: Multidetector CT imaging of the chest was performed using the standard protocol during bolus administration of intravenous contrast. Multiplanar CT image reconstructions and MIPs were obtained to evaluate the vascular anatomy. RADIATION DOSE REDUCTION: This exam was performed according to the departmental dose-optimization program which includes automated exposure control, adjustment of the mA and/or kV according to patient size and/or use of iterative reconstruction technique. CONTRAST:  91m OMNIPAQUE IOHEXOL 350 MG/ML SOLN COMPARISON:  08/24/2018 FINDINGS: Cardiovascular: Heart is normal size. There is calcified plaque over the left main and 3 vessel coronary arteries. Thoracic aorta is normal in caliber. There is calcified plaque throughout the thoracic aorta. Pulmonary arterial system is well opacified without evidence of emboli. Remaining vascular structures are unremarkable. Mediastinum/Nodes: Mediastinal adenopathy over the  pretracheal, precarinal and subcarinal regions with the largest node measuring 1.3 cm by short axis over the subcarinal region. No significant hilar adenopathy. Remaining mediastinal structures are unremarkable. Lungs/Pleura: Lungs are adequately inflated and demonstrate patchy bilateral airspace opacification likely multifocal pneumonia. Small to moderate size bilateral pleural effusions are present. Airways are unremarkable. Upper Abdomen: Calcified plaque over the abdominal aorta. There is an aortic stent graft beginning just below the level of the renal arteries on the most inferior images and therefore not completely evaluated. No acute findings in the visualized abdomen. Musculoskeletal: No focal abnormality. Review of the MIP  images confirms the above findings. IMPRESSION: 1. No evidence of pulmonary embolism. 2. Patchy bilateral airspace opacification likely multifocal pneumonia. Small to moderate size bilateral pleural effusions. 3. Mediastinal adenopathy over the pretracheal, precarinal and subcarinal regions likely reactive. 4. Aortic atherosclerosis. Atherosclerotic coronary artery disease. Aortic Atherosclerosis (ICD10-I70.0). Electronically Signed   By: Marin Olp M.D.   On: 05/14/2022 15:16   DG Chest Port 1 View  Result Date: 05/14/2022 CLINICAL DATA:  Short of breath.  Productive cough EXAM: PORTABLE CHEST 1 VIEW COMPARISON:  11/19/2018 FINDINGS: Normal cardiac silhouette. There is fine bilateral perihilar airspace disease with a lower lobe distribution. Potential small effusions. No pneumothorax. IMPRESSION: Bilateral lower lobe predominant airspace disease suggests pulmonary edema. Multifocal pneumonia less favored. Electronically Signed   By: Suzy Bouchard M.D.   On: 05/14/2022 12:51     Assessment and Plan:   Acute respiratory failure with hypoxia/multifocal PNA/elevated BNP/pl effusion per IM/CCM  echo pending. On ABX  improving.  Abnormal EKG and elevated troponin, echo pending. No hx of CAD though multiple risk factors, is followed at [facility] and was exposed to agent orange - fairly recent nuc study with some CAD but not enough to worry about per pt and done through the [facility] has been intolerant to statins.   HTN elevated today  PAD with stents to LSFA, sto SMA and endovascular abd aneurysm repair 2020 followed by Dr. Donzetta Matters.  On plavix   Risk Assessment/Risk Scores:     TIMI Risk Score for Unstable Angina or Non-ST Elevation MI:   The patient's TIMI risk score is 3, which indicates a 13% risk of all cause mortality, new or recurrent myocardial infarction or need for urgent revascularization in the next 14 days.  New York Heart Association (NYHA) Functional Class NYHA Class III        For  questions or updates, please contact [facility] Please consult [email or web address] for contact info under    Signed, Cecilie Kicks, NP  05/15/2022 11:25 AM

## 2022-05-15 NOTE — Progress Notes (Signed)
Pt not on BIPAP at this time. No distress noted at this time.

## 2022-05-15 NOTE — Progress Notes (Signed)
PROGRESS NOTE    Samuel [name]  ZOX:[id number] DOB: 04-04-1946 DOA: 05/14/2022 PCP: [name], MD    Brief Narrative:  76 year old with history of AAA, peripheral arterial disease, essential hypertension, hyperlipidemia presented from urgent care with progressive worsening of shortness of breath, productive cough with blood-tinged fatigue and malaise.  At urgent care, oxygen saturation 70% on room air.  Initially with afebrile.  Heart rate 95, respiration 20.  Blood pressure is 212/97.  WBC count 12.6.  D-dimer 0.8.  Chest x-ray with bilateral lower lobe predominant airspace disease.  CTA with no PE, patchy bilateral airspace opacification likely multifocal pneumonia.  Admitted with IV antibiotics.  Initially on BiPAP now on high flow oxygen.  COVID and influenza negative.   Assessment & Plan:   Acute hypoxemic respiratory failure, respiratory distress secondary to multifocal pneumonia: Antibiotics to treat bacterial pneumonia.  Currently remains on ceftriaxone and azithromycin. Chest physiotherapy, incentive spirometry, deep breathing exercises, sputum induction, mucolytic's and bronchodilators. Sputum cultures, blood cultures, Legionella and streptococcal antigen.  Pending. Supplemental oxygen to keep saturations more than 90%.  May use BiPAP for respiratory distress.   Checking echocardiogram.  Abnormal EKG, elevated troponins: Troponins are flat.  Probably demand ischemia.  Checking echocardiogram.  Has Nitropaste on.  Denies any chest pain. Patient had Lexiscan about a month ago and reportedly normal.  Chronic medical issues including Hypertension, Hyperlipidemia, Peripheral arterial disease, status post stents to SMA and endovascular aneurysm repair.  Currently on Plavix. Stable.   DVT prophylaxis: SCDs Start: 05/14/22 1540   Code Status: Full code Family Communication: None at the bedside Disposition Plan: Status is: Inpatient Remains inpatient appropriate because:  On significant oxygen requirement, IV antibiotics patient remains in the stepdown unit today.     Consultants:  Cardiology  Procedures:  None  Antimicrobials:  Rocephin and azithromycin 12/5---   Subjective: Patient was seen and examined.  Currently on 10 to 12 L of oxygen.  Mostly dry cough.  No more hemoptysis.  Patient tells me he feels much better than yesterday but not mobilized yet.  Denies any chest pain.  Still awaiting Nitropatch.  Objective: Vitals:   05/15/22 0200 05/15/22 0300 05/15/22 0400 05/15/22 0500  BP: (!) 141/58 (!) 144/48 137/60 (!) 163/70  Pulse: 72 81 79 77  Resp: 18 (!) 21 20 (!) 21  Temp:   98 F (36.7 C)   TempSrc:   Oral   SpO2: 94% 93% 96% 96%  Weight:      Height:        Intake/Output Summary (Last 24 hours) at 05/15/2022 0745 Last data filed at 05/15/2022 0654 Gross per 24 hour  Intake --  Output 575 ml  Net -575 ml   Filed Weights   05/14/22 1203 05/14/22 2018  Weight: 79.4 kg 81 kg    Examination:  General exam: Appears calm and comfortable, not in any distress. Respiratory system: Clear to auscultation. Respiratory effort normal.  No added Cardiovascular system: S1 & S2 heard, RRR. No pedal edema. Gastrointestinal system: Abdomen is nondistended, soft and nontender. No organomegaly or masses felt. Normal bowel sounds heard. Central nervous system: Alert and oriented. No focal neurological deficits. Extremities: Symmetric 5 x 5 power. Skin: No rashes, lesions or ulcers Psychiatry: Judgement and insight appear normal. Mood & affect appropriate.     Data Reviewed: I have personally reviewed following labs and imaging studies  CBC: Recent Labs  Lab 05/14/22 1210 05/15/22 0319  WBC 12.6* 13.6*  NEUTROABS 10.2*  --  HGB 11.6* 10.3*  HCT 34.6* 31.1*  MCV 98.9 100.0  PLT 197 353   Basic Metabolic Panel: Recent Labs  Lab 05/14/22 1210 05/15/22 0319  NA 137 134*  K 4.0 4.0  CL 104 101  CO2 24 22  GLUCOSE 117* 123*   BUN 21 21  CREATININE 1.29* 1.56*  CALCIUM 9.3 8.5*   GFR: Estimated Creatinine Clearance: 40.3 mL/min (A) (by C-G formula based on SCr of 1.56 mg/dL (H)). Liver Function Tests: Recent Labs  Lab 05/15/22 0319  AST 17  ALT 14  ALKPHOS 35*  BILITOT 0.8  PROT 6.2*  ALBUMIN 3.2*   No results for input(s): "LIPASE", "AMYLASE" in the last 168 hours. No results for input(s): "AMMONIA" in the last 168 hours. Coagulation Profile: No results for input(s): "INR", "PROTIME" in the last 168 hours. Cardiac Enzymes: No results for input(s): "CKTOTAL", "CKMB", "CKMBINDEX", "TROPONINI" in the last 168 hours. BNP (last 3 results) No results for input(s): "PROBNP" in the last 8760 hours. HbA1C: No results for input(s): "HGBA1C" in the last 72 hours. CBG: No results for input(s): "GLUCAP" in the last 168 hours. Lipid Profile: No results for input(s): "CHOL", "HDL", "LDLCALC", "TRIG", "CHOLHDL", "LDLDIRECT" in the last 72 hours. Thyroid Function Tests: No results for input(s): "TSH", "T4TOTAL", "FREET4", "T3FREE", "THYROIDAB" in the last 72 hours. Anemia Panel: No results for input(s): "VITAMINB12", "FOLATE", "FERRITIN", "TIBC", "IRON", "RETICCTPCT" in the last 72 hours. Sepsis Labs: Recent Labs  Lab 05/14/22 1752 05/14/22 2115  LATICACIDVEN 1.2 1.4    Recent Results (from the past 240 hour(s))  Resp Panel by RT-PCR (Flu A&B, Covid) Anterior Nasal Swab     Status: None   Collection Time: 05/14/22 12:11 PM   Specimen: Anterior Nasal Swab  Result Value Ref Range Status   SARS Coronavirus 2 by RT PCR NEGATIVE NEGATIVE Final    Comment: (NOTE) SARS-CoV-2 target nucleic acids are NOT DETECTED.  The SARS-CoV-2 RNA is generally detectable in upper respiratory specimens during the acute phase of infection. The lowest concentration of SARS-CoV-2 viral copies this assay can detect is 138 copies/mL. A negative result does not preclude SARS-Cov-2 infection and should not be used as the sole  basis for treatment or other patient management decisions. A negative result may occur with  improper specimen collection/handling, submission of specimen other than nasopharyngeal swab, presence of viral mutation(s) within the areas targeted by this assay, and inadequate number of viral copies(<138 copies/mL). A negative result must be combined with clinical observations, patient history, and epidemiological information. The expected result is Negative.  Fact Sheet for Patients:  EntrepreneurPulse.com.au  Fact Sheet for Healthcare Providers:  IncredibleEmployment.be  This test is no t yet approved or cleared by the [place] FDA and  has been authorized for detection and/or diagnosis of SARS-CoV-2 by FDA under an Emergency Use Authorization (EUA). This EUA will remain  in effect (meaning this test can be used) for the duration of the COVID-19 declaration under Section 564(b)(1) of the Act, 21 U.S.C.section 360bbb-3(b)(1), unless the authorization is terminated  or revoked sooner.       Influenza A by PCR NEGATIVE NEGATIVE Final   Influenza B by PCR NEGATIVE NEGATIVE Final    Comment: (NOTE) The Xpert Xpress SARS-CoV-2/FLU/RSV plus assay is intended as an aid in the diagnosis of influenza from Nasopharyngeal swab specimens and should not be used as a sole basis for treatment. Nasal washings and aspirates are unacceptable for Xpert Xpress SARS-CoV-2/FLU/RSV testing.  Fact Sheet for Patients: EntrepreneurPulse.com.au  Fact Sheet for Healthcare Providers: IncredibleEmployment.be  This test is not yet approved or cleared by the [place] FDA and has been authorized for detection and/or diagnosis of SARS-CoV-2 by FDA under an Emergency Use Authorization (EUA). This EUA will remain in effect (meaning this test can be used) for the duration of the COVID-19 declaration under Section 564(b)(1) of the Act,  21 U.S.C. section 360bbb-3(b)(1), unless the authorization is terminated or revoked.  Performed at [facility], [place] [address] [place], [place] [address]   MRSA Next Gen by PCR, Nasal     Status: None   Collection Time: 05/14/22  8:33 PM   Specimen: Nasal Mucosa; Nasal Swab  Result Value Ref Range Status   MRSA by PCR Next Gen NOT DETECTED NOT DETECTED Final    Comment: (NOTE) The GeneXpert MRSA Assay (FDA approved for NASAL specimens only), is one component of a comprehensive MRSA colonization surveillance program. It is not intended to diagnose MRSA infection nor to guide or monitor treatment for MRSA infections. Test performance is not FDA approved in patients less than 42 years old. Performed at [facility], [place] [address] [place],  [address]          Radiology Studies: CT Angio Chest PE W and/or Wo Contrast  Result Date: 05/14/2022 CLINICAL DATA:  High suspicion of pulmonary embolism. EXAM: CT ANGIOGRAPHY CHEST WITH CONTRAST TECHNIQUE: Multidetector CT imaging of the chest was performed using the standard protocol during bolus administration of intravenous contrast. Multiplanar CT image reconstructions and MIPs were obtained to evaluate the vascular anatomy. RADIATION DOSE REDUCTION: This exam was performed according to the departmental dose-optimization program which includes automated exposure control, adjustment of the mA and/or kV according to patient size and/or use of iterative reconstruction technique. CONTRAST:  61m OMNIPAQUE IOHEXOL 350 MG/ML SOLN COMPARISON:  08/24/2018 FINDINGS: Cardiovascular: Heart is normal size. There is calcified plaque over the left main and 3 vessel coronary arteries. Thoracic aorta is normal in caliber. There is calcified plaque throughout the thoracic aorta. Pulmonary arterial system is well opacified without evidence of emboli. Remaining vascular structures are unremarkable. Mediastinum/Nodes:  Mediastinal adenopathy over the pretracheal, precarinal and subcarinal regions with the largest node measuring 1.3 cm by short axis over the subcarinal region. No significant hilar adenopathy. Remaining mediastinal structures are unremarkable. Lungs/Pleura: Lungs are adequately inflated and demonstrate patchy bilateral airspace opacification likely multifocal pneumonia. Small to moderate size bilateral pleural effusions are present. Airways are unremarkable. Upper Abdomen: Calcified plaque over the abdominal aorta. There is an aortic stent graft beginning just below the level of the renal arteries on the most inferior images and therefore not completely evaluated. No acute findings in the visualized abdomen. Musculoskeletal: No focal abnormality. Review of the MIP images confirms the above findings. IMPRESSION: 1. No evidence of pulmonary embolism. 2. Patchy bilateral airspace opacification likely multifocal pneumonia. Small to moderate size bilateral pleural effusions. 3. Mediastinal adenopathy over the pretracheal, precarinal and subcarinal regions likely reactive. 4. Aortic atherosclerosis. Atherosclerotic coronary artery disease. Aortic Atherosclerosis (ICD10-I70.0). Electronically Signed   By: DMarin OlpM.D.   On: 05/14/2022 15:16   DG Chest Port 1 View  Result Date: 05/14/2022 CLINICAL DATA:  Short of breath.  Productive cough EXAM: PORTABLE CHEST 1 VIEW COMPARISON:  11/19/2018 FINDINGS: Normal cardiac silhouette. There is fine bilateral perihilar airspace disease with a lower lobe distribution. Potential small effusions. No pneumothorax. IMPRESSION: Bilateral lower lobe predominant airspace disease suggests pulmonary edema. Multifocal pneumonia less favored.  Electronically Signed   By: Suzy Bouchard M.D.   On: 05/14/2022 12:51        Scheduled Meds:  Chlorhexidine Gluconate Cloth  6 each Topical Daily   clopidogrel  75 mg Oral q AM   hydrALAZINE  50 mg Oral TID   lisinopril  10 mg Oral  Daily   And   hydrochlorothiazide  12.5 mg Oral Daily   mouth rinse  15 mL Mouth Rinse 4 times per day   tamsulosin  0.4 mg Oral Daily   Continuous Infusions:  azithromycin     cefTRIAXone (ROCEPHIN)  IV       LOS: 1 day    Time spent: 35 minutes    Barb Merino, MD [facility] Pager [phone]

## 2022-05-15 NOTE — Progress Notes (Signed)
   Echo from 09/18/21  at [place]  Name: Samuel [name], KNICKERBOCKER Study Date: 09/18/2021 08:31 AM Height: 70 in MRN: [id number] Patient Location: [facility]/MED/CP/CARDIO/ECHO/01 Weight: 184 lb DOB: Oct 30, 1945 Gender: Male BSA: 2.0 m2 Age: 76 yrs BP: 170/88 mmHg Reason For Study: Murmur   CP Order Number: [id number] Referring Physician: [name]  Interpretation Summary The left ventricle is normal in size. There is mild concentric left ventricular hypertrophy. EF= 57.3% by 2D biplane method. The right ventricle is normal in size and function. The left atrial size is normal. The aortic valve is trileaflet. Moderate thickening of the right and non-coronary cusps. No hemodynamically significant valvular aortic stenosis. There is mild mitral annular calcification. There is trace to mild mitral regurgitation. There is mild tricuspid regurgitation. Right ventricular systolic pressure is normal.  Left Ventricle: The left ventricle is normal in size. There is mild concentric left ventricular hypertrophy. EF= 57.3% by 2D biplane method.  Right Ventricle: The right ventricle is normal in size and function.  Left Atrium The left atrial size is normal.  Right Atrium: Right atrial size is normal.  Aortic Valve: The aortic valve is trileaflet. Moderate thickening of the right and non-coronary cusps. No hemodynamically significant valvular aortic stenosis.  Pulmonic Valve: The pulmonic valve is grossly normal as visualized. Trace to mild pulmonic valvular regurgitation.  Mitral Valve: There is mild mitral annular calcification. There is trace to mild mitral regurgitation.  Tricuspid Valve: The tricuspid valve is grossly normal. There is mild tricuspid regurgitation. Right ventricular systolic pressure is normal.  Venous: The IVC is normal in size with an inspiratory collapse of greater then 50%, suggesting normal right atrial pressure.  Great Vessels: The aortic root is normal  size.  Pericardium/Pleural: There is no pericardial effusion.   Nuclear stress test 12/17/21  done at the [facility] [id number] DOB-Jan 26, 1946 M Exm Date: Dec 25, 2021'@08'$ :00 Req Phys: Delma Post Loc: [facility]/MED/CARDIO/NUC/STRESS (Req Img Loc: [place] MEDICINE Service: Unknown    (Case [id number] COMPLETE) MYOCARDIAL PERFUSION(SPECT)EX.RED(NM Detailed) [id number] Reason for Study: Hypertensive Heart Disease without Heart  (Case [id number] COMPLETE) TC99M TETROFOSMIN (NM Detailed) [id number]  (Case [id number] COMPLETE) TC99M TETROFOSMIN-2 (NM Detailed) [id number]  Clinical History:  Report Status: Verified Date Reported: Dec 25, 2021 Date Verified: Dec 25, 2021 Verifier E-Sig:/ES/JULIE A MOYERS, MD  Report: History: Hypertensive Heart Disease without Heart  Comparison: None  Findings: Myocardial perfusion study was performed. Images were interpreted on the cardiac workstation.  Pharmacologic stress test was performed under the supervision of the cardiologist. Please see cardiologist notes for details.  Rest images were obtained utilizing 1.6 mCi of technetium 83mMyoview IV. Stress images were obtained utilizing 32.8 mCi of technetium 964myoview IV.  No fixed defects or areas of reversible ischemia are seen.  TID is 1.06.  Ejection fraction is calculated at 63% during stress.  Calculated end diastolic volume is 11569L.  --------------------------------------------------------    Impression:   No evidence of reversible ischemia.  Electronically Signed By: JuKathryne Erikssonlectronically Signed On: 12/25/2021 4:13 PM  Primary Diagnostic Code: NO ALERT REQUIRED  Primary Interpreting Staff: JUKathryne ErikssonMD, RADIOLOGIST (VeClearwater/JFarleyFNBredaPgVXY:[phone] after 5pm and on weekends call [phone] 05/15/2022.now

## 2022-05-16 DIAGNOSIS — A419 Sepsis, unspecified organism: Secondary | ICD-10-CM | POA: Diagnosis present

## 2022-05-16 DIAGNOSIS — J9601 Acute respiratory failure with hypoxia: Secondary | ICD-10-CM | POA: Diagnosis not present

## 2022-05-16 DIAGNOSIS — I4891 Unspecified atrial fibrillation: Secondary | ICD-10-CM | POA: Diagnosis not present

## 2022-05-16 LAB — COMPREHENSIVE METABOLIC PANEL
ALT: 15 U/L (ref 0–44)
AST: 19 U/L (ref 15–41)
Albumin: 3.3 g/dL — ABNORMAL LOW (ref 3.5–5.0)
Alkaline Phosphatase: 39 U/L (ref 38–126)
Anion gap: 12 (ref 5–15)
BUN: 28 mg/dL — ABNORMAL HIGH (ref 8–23)
CO2: 21 mmol/L — ABNORMAL LOW (ref 22–32)
Calcium: 8.6 mg/dL — ABNORMAL LOW (ref 8.9–10.3)
Chloride: 102 mmol/L (ref 98–111)
Creatinine, Ser: 1.62 mg/dL — ABNORMAL HIGH (ref 0.61–1.24)
GFR, Estimated: 43 mL/min — ABNORMAL LOW (ref >60)
Glucose, Bld: 119 mg/dL — ABNORMAL HIGH (ref 70–99)
Potassium: 3.5 mmol/L (ref 3.5–5.1)
Sodium: 135 mmol/L (ref 135–145)
Total Bilirubin: 0.8 mg/dL (ref 0.3–1.2)
Total Protein: 6.6 g/dL (ref 6.5–8.1)

## 2022-05-16 LAB — CBC WITH DIFFERENTIAL/PLATELET
Abs Immature Granulocytes: 0.03 10*3/uL (ref 0.00–0.07)
Basophils Absolute: 0 10*3/uL (ref 0.0–0.1)
Basophils Relative: 0 %
Eosinophils Absolute: 0.1 10*3/uL (ref 0.0–0.5)
Eosinophils Relative: 1 %
HCT: 29.4 % — ABNORMAL LOW (ref 39.0–52.0)
Hemoglobin: 9.9 g/dL — ABNORMAL LOW (ref 13.0–17.0)
Immature Granulocytes: 0 %
Lymphocytes Relative: 9 %
Lymphs Abs: 1 10*3/uL (ref 0.7–4.0)
MCH: 33.6 pg (ref 26.0–34.0)
MCHC: 33.7 g/dL (ref 30.0–36.0)
MCV: 99.7 fL (ref 80.0–100.0)
Monocytes Absolute: 1.5 10*3/uL — ABNORMAL HIGH (ref 0.1–1.0)
Monocytes Relative: 13 %
Neutro Abs: 8.8 10*3/uL — ABNORMAL HIGH (ref 1.7–7.7)
Neutrophils Relative %: 77 %
Platelets: 196 10*3/uL (ref 150–400)
RBC: 2.95 MIL/uL — ABNORMAL LOW (ref 4.22–5.81)
RDW: 13 % (ref 11.5–15.5)
WBC: 11.4 10*3/uL — ABNORMAL HIGH (ref 4.0–10.5)
nRBC: 0 % (ref 0.0–0.2)

## 2022-05-16 LAB — MAGNESIUM: Magnesium: 1.9 mg/dL (ref 1.7–2.4)

## 2022-05-16 LAB — PHOSPHORUS: Phosphorus: 3.6 mg/dL (ref 2.5–4.6)

## 2022-05-16 LAB — GLUCOSE, CAPILLARY: Glucose-Capillary: 103 mg/dL — ABNORMAL HIGH (ref 70–99)

## 2022-05-16 MED ORDER — METOPROLOL TARTRATE 5 MG/5ML IV SOLN
2.5000 mg | Freq: Once | INTRAVENOUS | Status: AC
  Administered 2022-05-16: 2.5 mg via INTRAVENOUS
  Filled 2022-05-16: qty 5

## 2022-05-16 MED ORDER — METOPROLOL TARTRATE 5 MG/5ML IV SOLN: 2.5000 mg | Freq: Once | INTRAVENOUS | Status: DC

## 2022-05-16 MED ORDER — HEPARIN BOLUS VIA INFUSION
4000.0000 [IU] | Freq: Once | INTRAVENOUS | Status: AC
  Administered 2022-05-16: 4000 [IU] via INTRAVENOUS
  Filled 2022-05-16: qty 4000

## 2022-05-16 MED ORDER — APIXABAN 5 MG PO TABS
5.0000 mg | ORAL_TABLET | Freq: Two times a day (BID) | ORAL | Status: DC
  Administered 2022-05-16 – 2022-05-20 (×9): 5 mg via ORAL
  Filled 2022-05-16 (×9): qty 1

## 2022-05-16 MED ORDER — MELATONIN 5 MG PO TABS
5.0000 mg | ORAL_TABLET | Freq: Once | ORAL | Status: AC
  Administered 2022-05-16: 5 mg via ORAL
  Filled 2022-05-16: qty 1

## 2022-05-16 MED ORDER — BISOPROLOL FUMARATE 5 MG PO TABS
5.0000 mg | ORAL_TABLET | Freq: Every day | ORAL | Status: DC
  Administered 2022-05-16: 5 mg via ORAL
  Filled 2022-05-16: qty 1

## 2022-05-16 MED ORDER — HEPARIN (PORCINE) 25000 UT/250ML-% IV SOLN
1000.0000 [IU]/h | INTRAVENOUS | Status: DC
  Administered 2022-05-16: 1000 [IU]/h via INTRAVENOUS
  Filled 2022-05-16: qty 250

## 2022-05-16 MED ORDER — MELATONIN 5 MG PO TABS
5.0000 mg | ORAL_TABLET | Freq: Every evening | ORAL | Status: AC | PRN
  Administered 2022-05-16 – 2022-05-17 (×2): 5 mg via ORAL
  Filled 2022-05-16 (×2): qty 1

## 2022-05-16 NOTE — Progress Notes (Signed)
Received call from Telemetry stating patient's HR jumped to the 140's, but not sustained with possible a-fib.  This RN immediately checked on patient.  He was sleeping soundly and did not appear to be in any type of distress.  An EKG was done.  It showed a-fib with rapid ventricular response.  Night provider was notified and  reviewed EKG.  No new orders at this time.

## 2022-05-16 NOTE — Progress Notes (Signed)
Pt not on BIPAP at this time.  

## 2022-05-16 NOTE — Progress Notes (Signed)
PROGRESS NOTE    Samuel [name]  GEZ:[id number] DOB: 10/06/1945 DOA: 05/14/2022 PCP: [name], MD    Brief Narrative:  76 year old with history of AAA, peripheral arterial disease, essential hypertension, hyperlipidemia presented from urgent care with progressive worsening of shortness of breath, productive cough with blood-tinged fatigue and malaise.  At urgent care, oxygen saturation 70% on room air.  Initially with afebrile.  Heart rate 95, respiration 20.  Blood pressure is 212/97.  WBC count 12.6.  D-dimer 0.8.  Chest x-ray with bilateral lower lobe predominant airspace disease.  CTA with no PE, patchy bilateral airspace opacification likely multifocal pneumonia.  Admitted with IV antibiotics.  Initially on BiPAP now on high flow oxygen.  COVID and influenza negative. 12/7, overnight with new onset rapid A-fib patient fairly stable.  Started on heparin infusion.   Assessment & Plan:   Acute hypoxemic respiratory failure, respiratory distress secondary to multifocal pneumonia: Severe sepsis present on admission secondary to pneumonia.  Hypoxemia, leukocytosis, tachypnea. Currently remains on ceftriaxone and azithromycin.  Clinically improving.  Now on 4 L oxygen. Chest physiotherapy, incentive spirometry, deep breathing exercises, sputum induction, mucolytic's and bronchodilators. Sputum cultures, blood cultures, Legionella and streptococcal antigen.  Negative so far. Supplemental oxygen to keep saturations more than 90%.  May use BiPAP for respiratory distress.   Echocardiogram with normal ejection fraction.  New onset atrial fibrillation: Overnight went into rapid A-fib.  Patient was asymptomatic.  Spontaneously converted to sinus rhythm with 5 mg of IV metoprolol.  Started on heparin.  Currently sinus rhythm.  Cardiology following.  Abnormal EKG, elevated troponins: Troponins are flat.  demand ischemia.  Echocardiogram with no new findings.  Will discontinue Nitropaste.  Denies  any chest pain. Patient had Lexiscan about a month ago and reportedly no reversible ischemia.  Acute kidney injury: Probably hemodynamic mediated.  Will monitor.  Blood pressures are adequate.  Chronic medical issues including Hypertension, medication changed to hydralazine and irbesartan. Hyperlipidemia, Peripheral arterial disease, status post stents to SMA and endovascular aneurysm repair.  Currently on Plavix. Stable.   DVT prophylaxis: SCDs Start: 05/14/22 1540   Code Status: Full code Family Communication: None at the bedside Disposition Plan: Status is: Inpatient Remains inpatient appropriate because: On significant oxygen requirement, IV antibiotics. Can be transferred to progressive bed.     Consultants:  Cardiology  Procedures:  None  Antimicrobials:  Rocephin and azithromycin 12/5---   Subjective:  Patient seen and examined.  Breathing much better.  Currently on 4 L oxygen.  Some dry cough but without any fever or shortness of breath.  Out of bed to chair yesterday.  Plan to walk in the hallway today. Overnight events noted.  Recorded A-fib and twelve-lead EKG, patient denied any chest pain or palpitations.  Cardiology was discussed overnight and started on heparin.  Currently sinus rhythm.  Normal ejection fraction.  Objective: Vitals:   05/16/22 0804 05/16/22 0832 05/16/22 0900 05/16/22 0947  BP: (!) 167/59  (!) 171/59 (!) 168/59  Pulse: 80  87   Resp: 19  16   Temp:  97.7 F (36.5 C)    TempSrc:  Axillary    SpO2: 91%  91%   Weight:      Height:        Intake/Output Summary (Last 24 hours) at 05/16/2022 1035 Last data filed at 05/16/2022 1031 Gross per 24 hour  Intake 526.05 ml  Output 900 ml  Net -373.95 ml   Filed Weights   05/14/22 1203 05/14/22 2018  Weight: 79.4 kg 81 kg    Examination:  General exam: Appears calm and comfortable, able to talk in complete sentences. Respiratory system: Clear to auscultation. Respiratory effort  normal.  No added sounds. SpO2: 95 % O2 Flow Rate (L/min): 4 L/min FiO2 (%): 60 %  Cardiovascular system: S1 & S2 heard, RRR. No pedal edema. Gastrointestinal system: Abdomen is nondistended, soft and nontender. No organomegaly or masses felt. Normal bowel sounds heard. Central nervous system: Alert and oriented. No focal neurological deficits. Extremities: Symmetric 5 x 5 power. Skin: No rashes, lesions or ulcers Psychiatry: Judgement and insight appear normal. Mood & affect appropriate.     Data Reviewed: I have personally reviewed following labs and imaging studies  CBC: Recent Labs  Lab 05/14/22 1210 05/15/22 0319 05/16/22 0320  WBC 12.6* 13.6* 11.4*  NEUTROABS 10.2*  --  8.8*  HGB 11.6* 10.3* 9.9*  HCT 34.6* 31.1* 29.4*  MCV 98.9 100.0 99.7  PLT 197 190 601   Basic Metabolic Panel: Recent Labs  Lab 05/14/22 1210 05/15/22 0319 05/16/22 0320  NA 137 134* 135  K 4.0 4.0 3.5  CL 104 101 102  CO2 24 22 21*  GLUCOSE 117* 123* 119*  BUN 21 21 28*  CREATININE 1.29* 1.56* 1.62*  CALCIUM 9.3 8.5* 8.6*  MG  --   --  1.9  PHOS  --   --  3.6   GFR: Estimated Creatinine Clearance: 38.8 mL/min (A) (by C-G formula based on SCr of 1.62 mg/dL (H)). Liver Function Tests: Recent Labs  Lab 05/15/22 0319 05/16/22 0320  AST 17 19  ALT 14 15  ALKPHOS 35* 39  BILITOT 0.8 0.8  PROT 6.2* 6.6  ALBUMIN 3.2* 3.3*   No results for input(s): "LIPASE", "AMYLASE" in the last 168 hours. No results for input(s): "AMMONIA" in the last 168 hours. Coagulation Profile: No results for input(s): "INR", "PROTIME" in the last 168 hours. Cardiac Enzymes: No results for input(s): "CKTOTAL", "CKMB", "CKMBINDEX", "TROPONINI" in the last 168 hours. BNP (last 3 results) No results for input(s): "PROBNP" in the last 8760 hours. HbA1C: No results for input(s): "HGBA1C" in the last 72 hours. CBG: No results for input(s): "GLUCAP" in the last 168 hours. Lipid Profile: No results for input(s):  "CHOL", "HDL", "LDLCALC", "TRIG", "CHOLHDL", "LDLDIRECT" in the last 72 hours. Thyroid Function Tests: No results for input(s): "TSH", "T4TOTAL", "FREET4", "T3FREE", "THYROIDAB" in the last 72 hours. Anemia Panel: No results for input(s): "VITAMINB12", "FOLATE", "FERRITIN", "TIBC", "IRON", "RETICCTPCT" in the last 72 hours. Sepsis Labs: Recent Labs  Lab 05/14/22 1752 05/14/22 2115  LATICACIDVEN 1.2 1.4    Recent Results (from the past 240 hour(s))  Resp Panel by RT-PCR (Flu A&B, Covid) Anterior Nasal Swab     Status: None   Collection Time: 05/14/22 12:11 PM   Specimen: Anterior Nasal Swab  Result Value Ref Range Status   SARS Coronavirus 2 by RT PCR NEGATIVE NEGATIVE Final    Comment: (NOTE) SARS-CoV-2 target nucleic acids are NOT DETECTED.  The SARS-CoV-2 RNA is generally detectable in upper respiratory specimens during the acute phase of infection. The lowest concentration of SARS-CoV-2 viral copies this assay can detect is 138 copies/mL. A negative result does not preclude SARS-Cov-2 infection and should not be used as the sole basis for treatment or other patient management decisions. A negative result may occur with  improper specimen collection/handling, submission of specimen other than nasopharyngeal swab, presence of viral mutation(s) within the areas targeted by this assay, and  inadequate number of viral copies(<138 copies/mL). A negative result must be combined with clinical observations, patient history, and epidemiological information. The expected result is Negative.  Fact Sheet for Patients:  EntrepreneurPulse.com.au  Fact Sheet for Healthcare Providers:  IncredibleEmployment.be  This test is no t yet approved or cleared by the [place] FDA and  has been authorized for detection and/or diagnosis of SARS-CoV-2 by FDA under an Emergency Use Authorization (EUA). This EUA will remain  in effect (meaning this test can be  used) for the duration of the COVID-19 declaration under Section 564(b)(1) of the Act, 21 U.S.C.section 360bbb-3(b)(1), unless the authorization is terminated  or revoked sooner.       Influenza A by PCR NEGATIVE NEGATIVE Final   Influenza B by PCR NEGATIVE NEGATIVE Final    Comment: (NOTE) The Xpert Xpress SARS-CoV-2/FLU/RSV plus assay is intended as an aid in the diagnosis of influenza from Nasopharyngeal swab specimens and should not be used as a sole basis for treatment. Nasal washings and aspirates are unacceptable for Xpert Xpress SARS-CoV-2/FLU/RSV testing.  Fact Sheet for Patients: EntrepreneurPulse.com.au  Fact Sheet for Healthcare Providers: IncredibleEmployment.be  This test is not yet approved or cleared by the [place] FDA and has been authorized for detection and/or diagnosis of SARS-CoV-2 by FDA under an Emergency Use Authorization (EUA). This EUA will remain in effect (meaning this test can be used) for the duration of the COVID-19 declaration under Section 564(b)(1) of the Act, 21 U.S.C. section 360bbb-3(b)(1), unless the authorization is terminated or revoked.  Performed at [facility], [place] [address] [place], [place] [address]   Culture, blood (routine x 2)     Status: None (Preliminary result)   Collection Time: 05/14/22  5:52 PM   Specimen: BLOOD  Result Value Ref Range Status   Specimen Description   Final    BLOOD RIGHT ANTECUBITAL Performed at [facility] [address], [place], [place] [address]    Special Requests   Final    BOTTLES DRAWN AEROBIC AND ANAEROBIC Blood Culture adequate volume Performed at [facility] [address], [place], [place] [address]    Culture   Final    NO GROWTH 2 DAYS Performed at [facility] [address], [place], [place] [address]    Report Status PENDING  Incomplete  MRSA Next Gen by PCR, Nasal      Status: None   Collection Time: 05/14/22  8:33 PM   Specimen: Nasal Mucosa; Nasal Swab  Result Value Ref Range Status   MRSA by PCR Next Gen NOT DETECTED NOT DETECTED Final    Comment: (NOTE) The GeneXpert MRSA Assay (FDA approved for NASAL specimens only), is one component of a comprehensive MRSA colonization surveillance program. It is not intended to diagnose MRSA infection nor to guide or monitor treatment for MRSA infections. Test performance is not FDA approved in patients less than 82 years old. Performed at [facility], [place] [address] [place], [place] [address]   Culture, blood (routine x 2)     Status: None (Preliminary result)   Collection Time: 05/14/22  9:15 PM   Specimen: BLOOD  Result Value Ref Range Status   Specimen Description   Final    BLOOD BLOOD RIGHT HAND Performed at [facility] [address], [place], [place] [address]    Special Requests   Final    BOTTLES DRAWN AEROBIC AND ANAEROBIC Blood Culture adequate volume Performed  at [facility], [place] [address] [place], [place] [address]    Culture   Final    NO GROWTH 1 DAY Performed at [facility], [place] [address], [place], [place] [address]    Report Status PENDING  Incomplete         Radiology Studies: ECHOCARDIOGRAM COMPLETE  Result Date: 05/15/2022    ECHOCARDIOGRAM REPORT   Patient Name:   Samuel [name] Date of Exam: 05/15/2022 Medical Rec #:  [id number]        Height:       70.0 in Accession #:    [id number]       Weight:       178.6 lb Date of Birth:  18-Oct-1945        BSA:          1.989 m Patient Age:    1 years         BP:           178/64 mmHg Patient Gender: M                HR:           90 bpm. Exam Location:  Inpatient Procedure: 2D Echo, Cardiac Doppler and Color Doppler Indications:    Abnl ECG  History:        Patient has prior history of Echocardiogram examinations, most                 recent 05/17/2005. Abnormal ECG;  Risk Factors:Hypertension,                 Diabetes, Dyslipidemia and Former Smoker.  Sonographer:    Wenda Low Referring Phys: [id number] [place]  1. Left ventricular ejection fraction, by estimation, is 60 to 65%. The left ventricle has normal function. The left ventricle has no regional wall motion abnormalities. There is moderate concentric left ventricular hypertrophy. Left ventricular diastolic parameters are consistent with Grade II diastolic dysfunction (pseudonormalization).  2. Right ventricular systolic function is normal. The right ventricular size is normal. There is normal pulmonary artery systolic pressure. The estimated right ventricular systolic pressure is 23.5 mmHg.  3. Left atrial size was mildly dilated.  4. Right atrial size was mildly dilated.  5. The mitral valve is normal in structure. No evidence of mitral valve regurgitation. No evidence of mitral stenosis.  6. The aortic valve is tricuspid. There is moderate calcification of the aortic valve. Aortic valve regurgitation is not visualized. Mild aortic valve stenosis. Aortic valve mean gradient measures 12.0 mmHg.  7. The inferior vena cava is normal in size with greater than 50% respiratory variability, suggesting right atrial pressure of 3 mmHg. FINDINGS  Left Ventricle: Left ventricular ejection fraction, by estimation, is 60 to 65%. The left ventricle has normal function. The left ventricle has no regional wall motion abnormalities. The left ventricular internal cavity size was normal in size. There is  moderate concentric left ventricular hypertrophy. Left ventricular diastolic parameters are consistent with Grade II diastolic dysfunction (pseudonormalization). Right Ventricle: The right ventricular size is normal. No increase in right ventricular wall thickness. Right ventricular systolic function is normal. There is normal pulmonary artery systolic pressure. The tricuspid regurgitant velocity is 2.64 m/s,  and  with an assumed right atrial pressure of 3 mmHg, the estimated right ventricular systolic pressure is 57.3 mmHg. Left Atrium: Left atrial size was mildly dilated. Right Atrium: Right atrial size was mildly dilated. Pericardium: There is  no evidence of pericardial effusion. Mitral Valve: The mitral valve is normal in structure. Mild mitral annular calcification. No evidence of mitral valve regurgitation. No evidence of mitral valve stenosis. MV peak gradient, 9.0 mmHg. The mean mitral valve gradient is 3.0 mmHg. Tricuspid Valve: The tricuspid valve is normal in structure. Tricuspid valve regurgitation is trivial. Aortic Valve: The aortic valve is tricuspid. There is moderate calcification of the aortic valve. Aortic valve regurgitation is not visualized. Mild aortic stenosis is present. Aortic valve mean gradient measures 12.0 mmHg. Aortic valve peak gradient measures 20.3 mmHg. Aortic valve area, by VTI measures 2.16 cm. Pulmonic Valve: The pulmonic valve was normal in structure. Pulmonic valve regurgitation is not visualized. Aorta: The aortic root is normal in size and structure. Venous: The inferior vena cava is normal in size with greater than 50% respiratory variability, suggesting right atrial pressure of 3 mmHg. IAS/Shunts: No atrial level shunt detected by color flow Doppler.  LEFT VENTRICLE PLAX 2D LVIDd:         4.70 cm   Diastology LVIDs:         3.40 cm   LV e' medial:    5.44 cm/s LV PW:         1.30 cm   LV E/e' medial:  23.9 LV IVS:        1.40 cm   LV e' lateral:   7.51 cm/s LVOT diam:     2.00 cm   LV E/e' lateral: 17.3 LV SV:         96 LV SV Index:   48 LVOT Area:     3.14 cm  RIGHT VENTRICLE RV Basal diam:  3.85 cm RV Mid diam:    3.60 cm RV S prime:     18.00 cm/s TAPSE (M-mode): 3.2 cm LEFT ATRIUM             Index        RIGHT ATRIUM           Index LA diam:        4.20 cm 2.11 cm/m   RA Area:     20.70 cm LA Vol (A2C):   76.2 ml 38.31 ml/m  RA Volume:   63.90 ml  32.12 ml/m LA Vol  (A4C):   66.0 ml 33.18 ml/m LA Biplane Vol: 71.0 ml 35.69 ml/m  AORTIC VALVE                     PULMONIC VALVE AV Area (Vmax):    1.91 cm      PV Vmax:       1.17 m/s AV Area (Vmean):   1.96 cm      PV Peak grad:  5.5 mmHg AV Area (VTI):     2.16 cm AV Vmax:           225.33 cm/s AV Vmean:          154.000 cm/s AV VTI:            0.446 m AV Peak Grad:      20.3 mmHg AV Mean Grad:      12.0 mmHg LVOT Vmax:         137.00 cm/s LVOT Vmean:        96.300 cm/s LVOT VTI:          0.307 m LVOT/AV VTI ratio: 0.69  AORTA Ao Root diam: 2.80 cm Ao Asc diam:  3.50 cm MITRAL VALVE  TRICUSPID VALVE MV Area (PHT): 5.79 cm     TR Peak grad:   27.9 mmHg MV Area VTI:   2.78 cm     TR Vmax:        264.00 cm/s MV Peak grad:  9.0 mmHg MV Mean grad:  3.0 mmHg     SHUNTS MV Vmax:       1.50 m/s     Systemic VTI:  0.31 m MV Vmean:      82.4 cm/s    Systemic Diam: 2.00 cm MV Decel Time: 131 msec MV E velocity: 130.00 cm/s MV A velocity: 98.50 cm/s MV E/A ratio:  1.32 Dalton McleanMD Electronically signed by Franki Monte Signature Date/Time: 05/15/2022/4:50:44 PM    Final    CT Angio Chest PE W and/or Wo Contrast  Result Date: 05/14/2022 CLINICAL DATA:  High suspicion of pulmonary embolism. EXAM: CT ANGIOGRAPHY CHEST WITH CONTRAST TECHNIQUE: Multidetector CT imaging of the chest was performed using the standard protocol during bolus administration of intravenous contrast. Multiplanar CT image reconstructions and MIPs were obtained to evaluate the vascular anatomy. RADIATION DOSE REDUCTION: This exam was performed according to the departmental dose-optimization program which includes automated exposure control, adjustment of the mA and/or kV according to patient size and/or use of iterative reconstruction technique. CONTRAST:  96m OMNIPAQUE IOHEXOL 350 MG/ML SOLN COMPARISON:  08/24/2018 FINDINGS: Cardiovascular: Heart is normal size. There is calcified plaque over the left main and 3 vessel coronary arteries.  Thoracic aorta is normal in caliber. There is calcified plaque throughout the thoracic aorta. Pulmonary arterial system is well opacified without evidence of emboli. Remaining vascular structures are unremarkable. Mediastinum/Nodes: Mediastinal adenopathy over the pretracheal, precarinal and subcarinal regions with the largest node measuring 1.3 cm by short axis over the subcarinal region. No significant hilar adenopathy. Remaining mediastinal structures are unremarkable. Lungs/Pleura: Lungs are adequately inflated and demonstrate patchy bilateral airspace opacification likely multifocal pneumonia. Small to moderate size bilateral pleural effusions are present. Airways are unremarkable. Upper Abdomen: Calcified plaque over the abdominal aorta. There is an aortic stent graft beginning just below the level of the renal arteries on the most inferior images and therefore not completely evaluated. No acute findings in the visualized abdomen. Musculoskeletal: No focal abnormality. Review of the MIP images confirms the above findings. IMPRESSION: 1. No evidence of pulmonary embolism. 2. Patchy bilateral airspace opacification likely multifocal pneumonia. Small to moderate size bilateral pleural effusions. 3. Mediastinal adenopathy over the pretracheal, precarinal and subcarinal regions likely reactive. 4. Aortic atherosclerosis. Atherosclerotic coronary artery disease. Aortic Atherosclerosis (ICD10-I70.0). Electronically Signed   By: DMarin OlpM.D.   On: 05/14/2022 15:16   DG Chest Port 1 View  Result Date: 05/14/2022 CLINICAL DATA:  Short of breath.  Productive cough EXAM: PORTABLE CHEST 1 VIEW COMPARISON:  11/19/2018 FINDINGS: Normal cardiac silhouette. There is fine bilateral perihilar airspace disease with a lower lobe distribution. Potential small effusions. No pneumothorax. IMPRESSION: Bilateral lower lobe predominant airspace disease suggests pulmonary edema. Multifocal pneumonia less favored. Electronically  Signed   By: SSuzy BouchardM.D.   On: 05/14/2022 12:51        Scheduled Meds:  bisoprolol  5 mg Oral Daily   Chlorhexidine Gluconate Cloth  6 each Topical Daily   clopidogrel  75 mg Oral q AM   hydrALAZINE  50 mg Oral TID   irbesartan  150 mg Oral Daily   mouth rinse  15 mL Mouth Rinse 4 times per day   tamsulosin  0.4 mg  Oral Daily   Continuous Infusions:  azithromycin Stopped (05/15/22 1109)   cefTRIAXone (ROCEPHIN)  IV Stopped (05/16/22 1016)   heparin 1,000 Units/hr (05/16/22 1031)     LOS: 2 days    Time spent: 35 minutes    Barb Merino, MD [facility] Pager [phone]

## 2022-05-16 NOTE — Progress Notes (Addendum)
Progress Note  Patient Name: Samuel [name] Date of Encounter: 05/16/2022  [facility] Cardiologist: None   Subjective   Breathing improved today.  Denies any chest pain.  Was able to get nuclear stress test from [facility] 12/2021 that showed no ischemia.  Went into afib with RVR last night but now in NSR  Inpatient Medications    Scheduled Meds:  Chlorhexidine Gluconate Cloth  6 each Topical Daily   clopidogrel  75 mg Oral q AM   hydrALAZINE  50 mg Oral TID   irbesartan  150 mg Oral Daily   mouth rinse  15 mL Mouth Rinse 4 times per day   tamsulosin  0.4 mg Oral Daily   Continuous Infusions:  azithromycin Stopped (05/15/22 1109)   cefTRIAXone (ROCEPHIN)  IV 2 g (05/16/22 0946)   heparin 1,000 Units/hr (05/16/22 0754)   PRN Meds: acetaminophen **OR** acetaminophen, ipratropium-albuterol, ondansetron **OR** ondansetron (ZOFRAN) IV, mouth rinse   Vital Signs    Vitals:   05/16/22 0804 05/16/22 0832 05/16/22 0900 05/16/22 0947  BP: (!) 167/59  (!) 171/59 (!) 168/59  Pulse: 80  87   Resp: 19  16   Temp:  97.7 F (36.5 C)    TempSrc:  Axillary    SpO2: 91%  91%   Weight:      Height:        Intake/Output Summary (Last 24 hours) at 05/16/2022 1012 Last data filed at 05/16/2022 0754 Gross per 24 hour  Intake 405.15 ml  Output 900 ml  Net -494.85 ml      05/14/2022    8:18 PM 05/14/2022   12:03 PM 11/01/2021    1:01 PM  Last 3 Weights  Weight (lbs) 178 lb 9.2 oz 175 lb 0.7 oz 175 lb  Weight (kg) 81 kg 79.4 kg 79.379 kg      Telemetry    NSR with episode of afib with RVR overnight- Personally Reviewed  ECG    No new EKG to review - Personally Reviewed  Physical Exam   GEN: No acute distress.   Neck: No JVD Cardiac: RRR, no murmurs, rubs, or gallops.  Respiratory: few scattered wheezes GI: Soft, nontender, non-distended  MS: No edema; No deformity. Neuro:  Nonfocal  Psych: Normal affect   Labs    High Sensitivity Troponin:   Recent Labs  Lab  05/14/22 1752 05/14/22 2115 05/15/22 0722  TROPONINIHS 257* 386* 348*      Chemistry Recent Labs  Lab 05/14/22 1210 05/15/22 0319 05/16/22 0320  NA 137 134* 135  K 4.0 4.0 3.5  CL 104 101 102  CO2 24 22 21*  GLUCOSE 117* 123* 119*  BUN 21 21 28*  CREATININE 1.29* 1.56* 1.62*  CALCIUM 9.3 8.5* 8.6*  PROT  --  6.2* 6.6  ALBUMIN  --  3.2* 3.3*  AST  --  17 19  ALT  --  14 15  ALKPHOS  --  35* 39  BILITOT  --  0.8 0.8  GFRNONAA 57* 45* 43*  ANIONGAP '9 11 12     '$ Hematology Recent Labs  Lab 05/14/22 1210 05/15/22 0319 05/16/22 0320  WBC 12.6* 13.6* 11.4*  RBC 3.50* 3.11* 2.95*  HGB 11.6* 10.3* 9.9*  HCT 34.6* 31.1* 29.4*  MCV 98.9 100.0 99.7  MCH 33.1 33.1 33.6  MCHC 33.5 33.1 33.7  RDW 12.9 13.1 13.0  PLT 197 190 196    BNP Recent Labs  Lab 05/14/22 1210  BNP 951.2*  DDimer  Recent Labs  Lab 05/14/22 1210  DDIMER 0.84*     Radiology    ECHOCARDIOGRAM COMPLETE  Result Date: 05/15/2022    ECHOCARDIOGRAM REPORT   Patient Name:   Samuel [name] Date of Exam: 05/15/2022 Medical Rec #:  [id number]        Height:       70.0 in Accession #:    [id number]       Weight:       178.6 lb Date of Birth:  [birth year]        BSA:          1.989 m Patient Age:    76 years         BP:           178/64 mmHg Patient Gender: M                HR:           90 bpm. Exam Location:  Inpatient Procedure: 2D Echo, Cardiac Doppler and Color Doppler Indications:    Abnl ECG  History:        Patient has prior history of Echocardiogram examinations, most                 recent 05/17/2005. Abnormal ECG; Risk Factors:Hypertension,                 Diabetes, Dyslipidemia and Former Smoker.  Sonographer:    Wenda Low Referring Phys: [id number] [place]  1. Left ventricular ejection fraction, by estimation, is 60 to 65%. The left ventricle has normal function. The left ventricle has no regional wall motion abnormalities. There is moderate concentric left  ventricular hypertrophy. Left ventricular diastolic parameters are consistent with Grade II diastolic dysfunction (pseudonormalization).  2. Right ventricular systolic function is normal. The right ventricular size is normal. There is normal pulmonary artery systolic pressure. The estimated right ventricular systolic pressure is 09.3 mmHg.  3. Left atrial size was mildly dilated.  4. Right atrial size was mildly dilated.  5. The mitral valve is normal in structure. No evidence of mitral valve regurgitation. No evidence of mitral stenosis.  6. The aortic valve is tricuspid. There is moderate calcification of the aortic valve. Aortic valve regurgitation is not visualized. Mild aortic valve stenosis. Aortic valve mean gradient measures 12.0 mmHg.  7. The inferior vena cava is normal in size with greater than 50% respiratory variability, suggesting right atrial pressure of 3 mmHg. FINDINGS  Left Ventricle: Left ventricular ejection fraction, by estimation, is 60 to 65%. The left ventricle has normal function. The left ventricle has no regional wall motion abnormalities. The left ventricular internal cavity size was normal in size. There is  moderate concentric left ventricular hypertrophy. Left ventricular diastolic parameters are consistent with Grade II diastolic dysfunction (pseudonormalization). Right Ventricle: The right ventricular size is normal. No increase in right ventricular wall thickness. Right ventricular systolic function is normal. There is normal pulmonary artery systolic pressure. The tricuspid regurgitant velocity is 2.64 m/s, and  with an assumed right atrial pressure of 3 mmHg, the estimated right ventricular systolic pressure is 81.8 mmHg. Left Atrium: Left atrial size was mildly dilated. Right Atrium: Right atrial size was mildly dilated. Pericardium: There is no evidence of pericardial effusion. Mitral Valve: The mitral valve is normal in structure. Mild mitral annular calcification. No evidence  of mitral valve regurgitation. No evidence of mitral valve stenosis. MV peak gradient, 9.0 mmHg. The mean mitral  valve gradient is 3.0 mmHg. Tricuspid Valve: The tricuspid valve is normal in structure. Tricuspid valve regurgitation is trivial. Aortic Valve: The aortic valve is tricuspid. There is moderate calcification of the aortic valve. Aortic valve regurgitation is not visualized. Mild aortic stenosis is present. Aortic valve mean gradient measures 12.0 mmHg. Aortic valve peak gradient measures 20.3 mmHg. Aortic valve area, by VTI measures 2.16 cm. Pulmonic Valve: The pulmonic valve was normal in structure. Pulmonic valve regurgitation is not visualized. Aorta: The aortic root is normal in size and structure. Venous: The inferior vena cava is normal in size with greater than 50% respiratory variability, suggesting right atrial pressure of 3 mmHg. IAS/Shunts: No atrial level shunt detected by color flow Doppler.  LEFT VENTRICLE PLAX 2D LVIDd:         4.70 cm   Diastology LVIDs:         3.40 cm   LV e' medial:    5.44 cm/s LV PW:         1.30 cm   LV E/e' medial:  23.9 LV IVS:        1.40 cm   LV e' lateral:   7.51 cm/s LVOT diam:     2.00 cm   LV E/e' lateral: 17.3 LV SV:         96 LV SV Index:   48 LVOT Area:     3.14 cm  RIGHT VENTRICLE RV Basal diam:  3.85 cm RV Mid diam:    3.60 cm RV S prime:     18.00 cm/s TAPSE (M-mode): 3.2 cm LEFT ATRIUM             Index        RIGHT ATRIUM           Index LA diam:        4.20 cm 2.11 cm/m   RA Area:     20.70 cm LA Vol (A2C):   76.2 ml 38.31 ml/m  RA Volume:   63.90 ml  32.12 ml/m LA Vol (A4C):   66.0 ml 33.18 ml/m LA Biplane Vol: 71.0 ml 35.69 ml/m  AORTIC VALVE                     PULMONIC VALVE AV Area (Vmax):    1.91 cm      PV Vmax:       1.17 m/s AV Area (Vmean):   1.96 cm      PV Peak grad:  5.5 mmHg AV Area (VTI):     2.16 cm AV Vmax:           225.33 cm/s AV Vmean:          154.000 cm/s AV VTI:            0.446 m AV Peak Grad:      20.3 mmHg AV  Mean Grad:      12.0 mmHg LVOT Vmax:         137.00 cm/s LVOT Vmean:        96.300 cm/s LVOT VTI:          0.307 m LVOT/AV VTI ratio: 0.69  AORTA Ao Root diam: 2.80 cm Ao Asc diam:  3.50 cm MITRAL VALVE                TRICUSPID VALVE MV Area (PHT): 5.79 cm     TR Peak grad:   27.9 mmHg MV Area VTI:   2.78 cm  TR Vmax:        264.00 cm/s MV Peak grad:  9.0 mmHg MV Mean grad:  3.0 mmHg     SHUNTS MV Vmax:       1.50 m/s     Systemic VTI:  0.31 m MV Vmean:      82.4 cm/s    Systemic Diam: 2.00 cm MV Decel Time: 131 msec MV E velocity: 130.00 cm/s MV A velocity: 98.50 cm/s MV E/A ratio:  1.32 Dalton McleanMD Electronically signed by Franki Monte Signature Date/Time: 05/15/2022/4:50:44 PM    Final    CT Angio Chest PE W and/or Wo Contrast  Result Date: 05/14/2022 CLINICAL DATA:  High suspicion of pulmonary embolism. EXAM: CT ANGIOGRAPHY CHEST WITH CONTRAST TECHNIQUE: Multidetector CT imaging of the chest was performed using the standard protocol during bolus administration of intravenous contrast. Multiplanar CT image reconstructions and MIPs were obtained to evaluate the vascular anatomy. RADIATION DOSE REDUCTION: This exam was performed according to the departmental dose-optimization program which includes automated exposure control, adjustment of the mA and/or kV according to patient size and/or use of iterative reconstruction technique. CONTRAST:  56m OMNIPAQUE IOHEXOL 350 MG/ML SOLN COMPARISON:  08/24/2018 FINDINGS: Cardiovascular: Heart is normal size. There is calcified plaque over the left main and 3 vessel coronary arteries. Thoracic aorta is normal in caliber. There is calcified plaque throughout the thoracic aorta. Pulmonary arterial system is well opacified without evidence of emboli. Remaining vascular structures are unremarkable. Mediastinum/Nodes: Mediastinal adenopathy over the pretracheal, precarinal and subcarinal regions with the largest node measuring 1.3 cm by short axis over the  subcarinal region. No significant hilar adenopathy. Remaining mediastinal structures are unremarkable. Lungs/Pleura: Lungs are adequately inflated and demonstrate patchy bilateral airspace opacification likely multifocal pneumonia. Small to moderate size bilateral pleural effusions are present. Airways are unremarkable. Upper Abdomen: Calcified plaque over the abdominal aorta. There is an aortic stent graft beginning just below the level of the renal arteries on the most inferior images and therefore not completely evaluated. No acute findings in the visualized abdomen. Musculoskeletal: No focal abnormality. Review of the MIP images confirms the above findings. IMPRESSION: 1. No evidence of pulmonary embolism. 2. Patchy bilateral airspace opacification likely multifocal pneumonia. Small to moderate size bilateral pleural effusions. 3. Mediastinal adenopathy over the pretracheal, precarinal and subcarinal regions likely reactive. 4. Aortic atherosclerosis. Atherosclerotic coronary artery disease. Aortic Atherosclerosis (ICD10-I70.0). Electronically Signed   By: DMarin OlpM.D.   On: 05/14/2022 15:16   DG Chest Port 1 View  Result Date: 05/14/2022 CLINICAL DATA:  Short of breath.  Productive cough EXAM: PORTABLE CHEST 1 VIEW COMPARISON:  11/19/2018 FINDINGS: Normal cardiac silhouette. There is fine bilateral perihilar airspace disease with a lower lobe distribution. Potential small effusions. No pneumothorax. IMPRESSION: Bilateral lower lobe predominant airspace disease suggests pulmonary edema. Multifocal pneumonia less favored. Electronically Signed   By: SSuzy BouchardM.D.   On: 05/14/2022 12:51    Cardiac Studies  2D echo 05/15/2022 IMPRESSIONS    1. Left ventricular ejection fraction, by estimation, is 60 to 65%. The  left ventricle has normal function. The left ventricle has no regional  wall motion abnormalities. There is moderate concentric left ventricular  hypertrophy. Left ventricular   diastolic parameters are consistent with Grade II diastolic dysfunction  (pseudonormalization).   2. Right ventricular systolic function is normal. The right ventricular  size is normal. There is normal pulmonary artery systolic pressure. The  estimated right ventricular systolic pressure is 310.2mmHg.   3.  Left atrial size was mildly dilated.   4. Right atrial size was mildly dilated.   5. The mitral valve is normal in structure. No evidence of mitral valve  regurgitation. No evidence of mitral stenosis.   6. The aortic valve is tricuspid. There is moderate calcification of the  aortic valve. Aortic valve regurgitation is not visualized. Mild aortic  valve stenosis. Aortic valve mean gradient measures 12.0 mmHg.   7. The inferior vena cava is normal in size with greater than 50%  respiratory variability, suggesting right atrial pressure of 3 mmHg.     Echo from 09/18/21  at [place]   Name: Samuel [name], Samuel [name] Study Date: 09/18/2021 08:31 AM Height: 70 in MRN: [id number] Patient Location: K[facility]/MED/CP/CARDIO/ECHO/01 Weight: 184 lb DOB: 01/23/1946 Gender: Male BSA: 2.0 m2 Age: 76 yrs BP: 170/88 mmHg Reason For Study: Murmur   CP Order Number: [id number] Referring Physician: [name]  Interpretation Summary The left ventricle is normal in size. There is mild concentric left ventricular hypertrophy. EF= 57.3% by 2D biplane method. The right ventricle is normal in size and function. The left atrial size is normal. The aortic valve is trileaflet. Moderate thickening of the right and non-coronary cusps. No hemodynamically significant valvular aortic stenosis. There is mild mitral annular calcification. There is trace to mild mitral regurgitation. There is mild tricuspid regurgitation. Right ventricular systolic pressure is normal.  Left Ventricle: The left ventricle is normal in size. There is mild concentric left ventricular hypertrophy. EF= 57.3% by 2D biplane  method.  Right Ventricle: The right ventricle is normal in size and function.  Left Atrium The left atrial size is normal.  Right Atrium: Right atrial size is normal.  Aortic Valve: The aortic valve is trileaflet. Moderate thickening of the right and non-coronary cusps. No hemodynamically significant valvular aortic stenosis.  Pulmonic Valve: The pulmonic valve is grossly normal as visualized. Trace to mild pulmonic valvular regurgitation.  Mitral Valve: There is mild mitral annular calcification. There is trace to mild mitral regurgitation.  Tricuspid Valve: The tricuspid valve is grossly normal. There is mild tricuspid regurgitation. Right ventricular systolic pressure is normal.  Venous: The IVC is normal in size with an inspiratory collapse of greater then 50%, suggesting normal right atrial pressure.  Great Vessels: The aortic root is normal size.  Pericardium/Pleural: There is no pericardial effusion.     Nuclear stress test 12/17/21  done at the [facility] [id number] DOB-Jan 26, 1946 M Exm Date: Dec 25, 2021'@08'$ :00 Req Phys: Delma Post Loc: KER/MED/CARDIO/NUC/STRESS ([place] MED[place]INE Service: Unknown    (Case [id number] COMPLETE) MYOCARDIAL PERFUSION(SPECT)EX.RED(NM Detailed) [id number] Reason for Study: Hypertensive Heart Disease without Heart  (Case [id number] COMPLETE) TC99M TETROFOSMIN (NM Detailed) [id number]  (Case [id number] COMPLETE) TC99M TETROFOSMIN-2 (NM Detailed) [id number]  Clinical History:  Report Status: Verified Date Reported: Dec 25, 2021 Date Verified: Dec 25, 2021 Verifier E-Sig:/ES/JULIE A MOYERS, MD  Report: History: Hypertensive Heart Disease without Heart  Comparison: None  Findings: Myocardial perfusion study was performed. Images were interpreted on the cardiac workstation.  Pharmacologic stress test was performed under the supervision of the cardiologist. Please see cardiologist  notes for details.  Rest images were obtained utilizing 1.6 mCi of technetium 66mMyoview IV. Stress images were obtained utilizing 32.8 mCi of technetium 960myoview IV.  No fixed defects or areas of reversible ischemia are seen.  TID is 1.06.  Ejection fraction is calculated at 63% during stress.  Calculated end diastolic volume is 11436L.  --------------------------------------------------------  Impression:   No evidence of reversible ischemia.  Electronically Signed By: Kathryne Eriksson Electronically Signed On: 12/25/2021 4:13 PM    Patient Profile     76 y.o. male  with a hx of PVD, s/p LSFA stent 2012, R CEA 2006,  and HTN. Intolerance to statins, hx of endovascular aneurysm repair abd in 2020, chronic mesenteric ischemia with stent to SMA-2021 who is being seen 05/15/2022 for the evaluation of abnormal EKG with elevated troponin at the request of Dr. Sloan Leiter.   Assessment & Plan    #Acute hypoxemic Respiratory failure -related to multifocal PNA -chest CT showed bilateral pleural effusions with elevated BNP but suspect most of this is related to his PNA -he does have some mild LE edema on exam -2D echo with normal LVF with diastolic dysfunction -continue antibx per TRH   #Elevated Troponin -elevated (257>>386>>348) but with flat trend and not c/w ACS  -in setting of normal LVF (echo this admit) this is likely related to demand ischemia from acute hypoxemic respiratory failure and PNA -recent nuclear stress test at [facility] 12/2021 with no ischemia and normal LVF -he has not had any angina>>no further inpt ischemic workup and [facility] -ok to stop IV Heparin and transition to SQ for DVT prophylaxis   #HTN -BP remains elevated -home meds include Hydralazine '50mg'$  TID, Lisinopril HCT 10-12.'5mg'$  daily -Stopped Lisinopril HCT and started Irbesartan '150mg'$  daily yesterday -no diuretic at this time due to AKI -will not increase ARB further until we see where SCR is  going to trend -continue Hydralazine '50mg'$  TID -Will start Bisoprolol '5mg'$  daily (B1 selective) -goal would be to uptitrate B1 selective BB as well as ARB (as renal function allows) and get off Hydralazine to increase compliance.   -could also consider Amlodipine if needed   #PAD -s/p LSFA stent 2012, RCEA 2006, endovascular AAA repair 2020, stent to SMA for chronic mesenteric ischemia 2021 -he is statin intolerant -would benefit from referral to lipid clinic for PSCK9i -check FLP in am  # NEW ONSET ATRIAL FIBRILLATION -had an episode of PAF last night but now back in NSR -CHADS2VASC score is 4 -this occurred in the setting of PNA but he was unaware of his afib at the time so he could be having silent PAF at home -I think the best course of action at this time is to transition to Eliquis '5mg'$  BID and then can consider outpatient event monitor to assess for any further PAF -starting Bisoprolol for HTN which should help suppress further afib -will refer to afib clinic a discharge     I have spent a total of 30 minutes with patient reviewing 2D echo this admit, 2D echo and nuclear stress test from [place] , telemetry, EKGs, labs and examining patient as well as establishing an assessment and plan that was discussed with the patient.  > 50% of time was spent in direct patient care.    For questions or updates, please contact [place] Please consult [email or web address] for contact info under        Signed, Fransico Him, MD  05/16/2022, 10:12 AM

## 2022-05-16 NOTE — Progress Notes (Signed)
       CROSS COVER NOTE  NAME: WELLES WALTHALL MRN: [id number] DOB : [birth year]    Date of Service   05/16/2022   HPI/Events of Note   Patient was admitted with SOB and pink-tinged productive cough.  Chest images negative for PE, but did show PNA.  BNP elevated. Elevated troponins noted without chest pain.   Patient has significant history of HTN, HLD with intolerance to statin, and PAD s/p LSFA stent 2012, RCEA 2006, endovascular AAA repair 2020, stent to SMA for chronic mesenteric ischemia 2021.  Patient is currently on Plavix.  Bedside RN notified me of cardiac rhythm change.  EKG reviewed, new onset atrial fibrillation noted.  RN did notice HR momentarily up to 140s.  HR relatively controlled now in the 100's. CHA2DS2-VASc score 4 (Age> 75, HTN history, vascular disease history).    Communicated via secure chat with Dr. Rudi Rummage from cardiology who agreed with plan to start pt on heparin gtt.   Interventions/ Plan   Heparin gtt       Raenette Rover, [place], [place]

## 2022-05-16 NOTE — Progress Notes (Signed)
ANTICOAGULATION CONSULT NOTE - Initial Consult  Pharmacy Consult for Eliquis Indication: atrial fibrillation  Allergies  Allergen Reactions   Diclofenac Diarrhea   Lipitor [Atorvastatin] Other (See Comments)    Muscle pain   Livalo [Pitavastatin] Other (See Comments)    Muscle aches   Statins     Muscle aches    Patient Measurements: Height: '5\' 10"'$  (177.8 cm) Weight: 81 kg (178 lb 9.2 oz) IBW/kg (Calculated) : 73 Heparin Dosing Weight: TBW  Vital Signs: Temp: 98 F (36.7 C) (12/07 1145) Temp Source: Oral (12/07 1145) BP: 154/57 (12/07 1200) Pulse Rate: 74 (12/07 1200)  Labs: Recent Labs    05/14/22 1210 05/14/22 1752 05/14/22 2115 05/15/22 0319 05/15/22 0722 05/16/22 0320  HGB 11.6*  --   --  10.3*  --  9.9*  HCT 34.6*  --   --  31.1*  --  29.4*  PLT 197  --   --  190  --  196  CREATININE 1.29*  --   --  1.56*  --  1.62*  TROPONINIHS  --  257* 386*  --  348*  --      Estimated Creatinine Clearance: 38.8 mL/min (A) (by C-G formula based on SCr of 1.62 mg/dL (H)).   Medical History: Past Medical History:  Diagnosis Date   AAA (abdominal aortic aneurysm) ([place])    Carotid artery occlusion    Carotid artery stenosis 05/14/2022   Jun 05, 2019 Entered By: Ria Clock Comment: Right carotid endarterectomy.   Essential hypertension 10/16/2007   Qualifier: Diagnosis of   By: Danelle Earthly CMA, Darlene       Genital warts    history of   Glucose intolerance 05/14/2022   History of carotid stenosis    s/p right carotid endarterectomy 12/06   Hyperlipidemia    Hypertension    PAD (peripheral artery disease) (HCC)    Left leg   Peripheral vascular disease ([place]) 10/16/2007   Centricity Description: INTERMITTENT CLAUDICATION, LEFT LEG  Qualifier: Diagnosis of   By: Danelle Earthly CMA, Darlene      Centricity Description: UNSPECIFIED PERIPHERAL VASCULAR DISEASE  Qualifier: Diagnosis of   By: Wynona Luna       Medications:  Infusions:   azithromycin Stopped  (05/16/22 1142)   cefTRIAXone (ROCEPHIN)  IV Stopped (05/16/22 1016)    Assessment: 76 yo M  admitted with shortness of breath/PNA, found to have new onset Afib with RVR, now back in NSR.  Pharmacy was initially consulted to dose IV heparin, but is now consulted to transition to apixaban.    Age < 41, weight > 60kg SCr increased to 1.6 (baseline 1.29 on admission) CBC:  Hgb decreased to 9.9, Plt WNL RN reports no bleeding or complications.    Goal of Therapy:  Monitor platelets by anticoagulation protocol: Yes   Plan:  Apixaban 5 mg PO BID Pharmacy to provide apixaban education and coupon prior to discharge Pharmacy will sign off at this time but will continue to monitor peripherally.  Please reconsult if a change in clinical status warrants re-evaluation of anticoagulation.    Gretta Arab PharmD, BCPS [facility] main pharmacy [phone] 05/16/2022 12:07 PM

## 2022-05-16 NOTE — Discharge Instructions (Signed)

## 2022-05-16 NOTE — Progress Notes (Signed)
Report called to Deatra Ina RN. All questions answered at this time. All patient belongings and paper chart transported with patient. Patient was transferred in the wheelchair by RN and NT on tele on 4L O2 via [place]. Handoff completed.

## 2022-05-16 NOTE — Progress Notes (Signed)
ANTICOAGULATION CONSULT NOTE - Initial Consult  Pharmacy Consult for Heparin Indication: atrial fibrillation  Allergies  Allergen Reactions   Diclofenac Diarrhea   Lipitor [Atorvastatin] Other (See Comments)    Muscle pain   Livalo [Pitavastatin] Other (See Comments)    Muscle aches   Statins     Muscle aches    Patient Measurements: Height: '5\' 10"'$  (177.8 cm) Weight: 81 kg (178 lb 9.2 oz) IBW/kg (Calculated) : 73 Heparin Dosing Weight: TBW  Vital Signs: Temp: 98.5 F (36.9 C) (12/07 0335) Temp Source: Oral (12/07 0335) BP: 195/65 (12/06 2100) Pulse Rate: 85 (12/06 2100)  Labs: Recent Labs    05/14/22 1210 05/14/22 1752 05/14/22 2115 05/15/22 0319 05/15/22 0722 05/16/22 0320  HGB 11.6*  --   --  10.3*  --  9.9*  HCT 34.6*  --   --  31.1*  --  29.4*  PLT 197  --   --  190  --  196  CREATININE 1.29*  --   --  1.56*  --  1.62*  TROPONINIHS  --  257* 386*  --  348*  --     Estimated Creatinine Clearance: 38.8 mL/min (A) (by C-G formula based on SCr of 1.62 mg/dL (H)).   Medical History: Past Medical History:  Diagnosis Date   AAA (abdominal aortic aneurysm) ([place])    Carotid artery occlusion    Carotid artery stenosis 05/14/2022   Jun 05, 2019 Entered By: Ria Clock Comment: Right carotid endarterectomy.   Essential hypertension 10/16/2007   Qualifier: Diagnosis of   By: Danelle Earthly CMA, Darlene       Genital warts    history of   Glucose intolerance 05/14/2022   History of carotid stenosis    s/p right carotid endarterectomy 12/06   Hyperlipidemia    Hypertension    PAD (peripheral artery disease) ([facility])    Left leg   Peripheral vascular disease ([place]) 10/16/2007   Centricity Description: INTERMITTENT CLAUDICATION, LEFT LEG  Qualifier: Diagnosis of   By: Danelle Earthly CMA, Darlene      Centricity Description: UNSPECIFIED PERIPHERAL VASCULAR DISEASE  Qualifier: Diagnosis of   By: Wynona Luna       Medications:  Infusions:   azithromycin Stopped  (05/15/22 1109)   cefTRIAXone (ROCEPHIN)  IV Stopped (05/15/22 1035)    Assessment: 76 yo M  admitted with shortness of breath/PNA.  Currently  in Afib.   Pharmacy consulted to dose IV heparin.  Goal of Therapy:  Heparin level 0.3-0.7 units/ml Monitor platelets by anticoagulation protocol: Yes   Plan:  Heparin 4000 units IV bolus x1 Heparin infusion at 1000 units/hr Check 8h heparin level after infusion starts Daily CBC & heparin level while on heparin  Netta Cedars PharmD 05/16/2022,5:50 AM

## 2022-05-17 DIAGNOSIS — J9601 Acute respiratory failure with hypoxia: Secondary | ICD-10-CM | POA: Diagnosis not present

## 2022-05-17 DIAGNOSIS — R7989 Other specified abnormal findings of blood chemistry: Secondary | ICD-10-CM

## 2022-05-17 DIAGNOSIS — I480 Paroxysmal atrial fibrillation: Secondary | ICD-10-CM | POA: Diagnosis not present

## 2022-05-17 DIAGNOSIS — I10 Essential (primary) hypertension: Secondary | ICD-10-CM | POA: Diagnosis not present

## 2022-05-17 LAB — CBC
HCT: 26 % — ABNORMAL LOW (ref 39.0–52.0)
Hemoglobin: 8.7 g/dL — ABNORMAL LOW (ref 13.0–17.0)
MCH: 33.3 pg (ref 26.0–34.0)
MCHC: 33.5 g/dL (ref 30.0–36.0)
MCV: 99.6 fL (ref 80.0–100.0)
Platelets: 171 10*3/uL (ref 150–400)
RBC: 2.61 MIL/uL — ABNORMAL LOW (ref 4.22–5.81)
RDW: 12.9 % (ref 11.5–15.5)
WBC: 8.2 10*3/uL (ref 4.0–10.5)
nRBC: 0 % (ref 0.0–0.2)

## 2022-05-17 LAB — LIPID PANEL
Cholesterol: 99 mg/dL (ref 0–200)
HDL: 34 mg/dL — ABNORMAL LOW (ref >40)
LDL Cholesterol: 53 mg/dL (ref 0–99)
Total CHOL/HDL Ratio: 2.9 RATIO
Triglycerides: 61 mg/dL (ref <150)
VLDL: 12 mg/dL (ref 0–40)

## 2022-05-17 MED ORDER — BISOPROLOL FUMARATE 5 MG PO TABS
10.0000 mg | ORAL_TABLET | Freq: Every day | ORAL | Status: DC
  Administered 2022-05-17 – 2022-05-20 (×4): 10 mg via ORAL
  Filled 2022-05-17 (×4): qty 2

## 2022-05-17 NOTE — Progress Notes (Signed)
Mobility Specialist - Progress Note   05/17/22 1403  Mobility  Activity Ambulated independently in hallway  Level of Assistance Independent  Assistive Device None  Distance Ambulated (ft) 300 ft  Activity Response Tolerated well  Mobility Referral Yes  $Mobility charge 1 Mobility   Pt received in room standing and agreeable to mobility. No complaints during mobility. Pt to room standing after session with all needs met.     During mobility: 88% SpO2  Set designer

## 2022-05-17 NOTE — Progress Notes (Signed)
PROGRESS NOTE    Samuel [name]  DEY:[id number] DOB: September 28, 1945 DOA: 05/14/2022 PCP: [name], MD    Brief Narrative:  76 year old with history of AAA, peripheral arterial disease, essential hypertension, hyperlipidemia presented from urgent care with progressive worsening of shortness of breath, productive cough with blood-tinged fatigue and malaise.  At urgent care, oxygen saturation 70% on room air.  Initially with afebrile.  Heart rate 95, respiration 20.  Blood pressure is 212/97.  WBC count 12.6.  D-dimer 0.8.  Chest x-ray with bilateral lower lobe predominant airspace disease.  CTA with no PE, patchy bilateral airspace opacification likely multifocal pneumonia.  Admitted with IV antibiotics.  Initially on BiPAP now on high flow oxygen.  COVID and influenza negative. 12/7, overnight with new onset rapid A-fib patient fairly stable.  Started on heparin infusion.   Assessment & Plan:   Acute hypoxemic respiratory failure, respiratory distress secondary to multifocal pneumonia: Severe sepsis present on admission secondary to pneumonia.  Hypoxemia, leukocytosis, tachypnea.  Currently remains on ceftriaxone and azithromycin.  Clinically improving.  Now on 4 L oxygen. Attempted to mobilize in the hallway.  Became dyspneic with saturations dropping less than 77%.  On 4 L oxygen. Chest physiotherapy, incentive spirometry, deep breathing exercises, sputum induction, mucolytic's and bronchodilators. Sputum cultures, blood cultures, Legionella and streptococcal antigen.  Negative so far. Supplemental oxygen to keep saturations more than 90%.  Echocardiogram with normal ejection fraction. If continues to require oxygen, anticipate going home with supplemental oxygen.  New onset atrial fibrillation: 12/7 overnight went into rapid A-fib.  Patient was asymptomatic.  Spontaneously converted to sinus rhythm with 5 mg of IV metoprolol.   Currently on bisoprolol.  On Eliquis.  Rate controlled in  sinus rhythm now.  Abnormal EKG, elevated troponins: Troponins are flat.  demand ischemia.  Echocardiogram with no new findings. Patient had Lexiscan about a month ago and reportedly no reversible ischemia.  Acute kidney injury: Probably hemodynamic mediated.  Will monitor.  Blood pressures are adequate.  Recheck tomorrow morning.  Chronic medical issues including Hypertension, medication changed to hydralazine , bisoprolol and irbesartan. Hyperlipidemia, Peripheral arterial disease, status post stents to SMA and endovascular aneurysm repair.  Currently on Plavix. Stable.   DVT prophylaxis: SCDs Start: 05/14/22 1540 apixaban (ELIQUIS) tablet 5 mg   Code Status: Full code Family Communication: None at the bedside Disposition Plan: Status is: Inpatient Remains inpatient appropriate because: On significant oxygen requirement, IV antibiotics.     Consultants:  Cardiology  Procedures:  None  Antimicrobials:  Rocephin and azithromycin 12/5---   Subjective:  Seen and examined.  No complaints at rest.  Was on 2 L oxygen.  Became dyspneic on mobility requiring 4 L of oxygen.  Telemetry monitor with sinus rhythm occasional PACs.  Without chest pain or palpitations.  Objective: Vitals:   05/16/22 2213 05/17/22 0151 05/17/22 0604 05/17/22 0800  BP: (!) 159/60 (!) 155/64 (!) 159/63   Pulse: 71 72 67   Resp: '20 20 18 17  '$ Temp: 99.7 F (37.6 C) 99.4 F (37.4 C) 98.2 F (36.8 C)   TempSrc: Oral Oral Oral   SpO2: 96% 93% 93%   Weight:      Height:        Intake/Output Summary (Last 24 hours) at 05/17/2022 1112 Last data filed at 05/16/2022 2215 Gross per 24 hour  Intake 627.29 ml  Output 150 ml  Net 477.29 ml   Filed Weights   05/14/22 1203 05/14/22 2018  Weight: 79.4 kg 81 kg  Examination:  General exam: Appears calm and comfortable, able to talk in complete sentences at rest. Respiratory system: Clear to auscultation. Respiratory effort normal.  No added  sounds. SpO2: 93 % O2 Flow Rate (L/min): 3 L/min FiO2 (%): 60 %  Cardiovascular system: S1 & S2 heard, RRR. No pedal edema. Gastrointestinal system: Abdomen is nondistended, soft and nontender. No organomegaly or masses felt. Normal bowel sounds heard. Central nervous system: Alert and oriented. No focal neurological deficits. Extremities: Symmetric 5 x 5 power. Skin: No rashes, lesions or ulcers Psychiatry: Judgement and insight appear normal. Mood & affect appropriate.     Data Reviewed: I have personally reviewed following labs and imaging studies  CBC: Recent Labs  Lab 05/14/22 1210 05/15/22 0319 05/16/22 0320 05/17/22 0414  WBC 12.6* 13.6* 11.4* 8.2  NEUTROABS 10.2*  --  8.8*  --   HGB 11.6* 10.3* 9.9* 8.7*  HCT 34.6* 31.1* 29.4* 26.0*  MCV 98.9 100.0 99.7 99.6  PLT 197 190 196 916   Basic Metabolic Panel: Recent Labs  Lab 05/14/22 1210 05/15/22 0319 05/16/22 0320  NA 137 134* 135  K 4.0 4.0 3.5  CL 104 101 102  CO2 24 22 21*  GLUCOSE 117* 123* 119*  BUN 21 21 28*  CREATININE 1.29* 1.56* 1.62*  CALCIUM 9.3 8.5* 8.6*  MG  --   --  1.9  PHOS  --   --  3.6   GFR: Estimated Creatinine Clearance: 38.8 mL/min (A) (by C-G formula based on SCr of 1.62 mg/dL (H)). Liver Function Tests: Recent Labs  Lab 05/15/22 0319 05/16/22 0320  AST 17 19  ALT 14 15  ALKPHOS 35* 39  BILITOT 0.8 0.8  PROT 6.2* 6.6  ALBUMIN 3.2* 3.3*   No results for input(s): "LIPASE", "AMYLASE" in the last 168 hours. No results for input(s): "AMMONIA" in the last 168 hours. Coagulation Profile: No results for input(s): "INR", "PROTIME" in the last 168 hours. Cardiac Enzymes: No results for input(s): "CKTOTAL", "CKMB", "CKMBINDEX", "TROPONINI" in the last 168 hours. BNP (last 3 results) No results for input(s): "PROBNP" in the last 8760 hours. HbA1C: No results for input(s): "HGBA1C" in the last 72 hours. CBG: Recent Labs  Lab 05/16/22 1143  GLUCAP 103*   Lipid Profile: Recent  Labs    05/17/22 0414  CHOL 99  HDL 34*  LDLCALC 53  TRIG 61  CHOLHDL 2.9   Thyroid Function Tests: No results for input(s): "TSH", "T4TOTAL", "FREET4", "T3FREE", "THYROIDAB" in the last 72 hours. Anemia Panel: No results for input(s): "VITAMINB12", "FOLATE", "FERRITIN", "TIBC", "IRON", "RETICCTPCT" in the last 72 hours. Sepsis Labs: Recent Labs  Lab 05/14/22 1752 05/14/22 2115  LATICACIDVEN 1.2 1.4    Recent Results (from the past 240 hour(s))  Resp Panel by RT-PCR (Flu A&B, Covid) Anterior Nasal Swab     Status: None   Collection Time: 05/14/22 12:11 PM   Specimen: Anterior Nasal Swab  Result Value Ref Range Status   SARS Coronavirus 2 by RT PCR NEGATIVE NEGATIVE Final    Comment: (NOTE) SARS-CoV-2 target nucleic acids are NOT DETECTED.  The SARS-CoV-2 RNA is generally detectable in upper respiratory specimens during the acute phase of infection. The lowest concentration of SARS-CoV-2 viral copies this assay can detect is 138 copies/mL. A negative result does not preclude SARS-Cov-2 infection and should not be used as the sole basis for treatment or other patient management decisions. A negative result may occur with  improper specimen collection/handling, submission of specimen other than  nasopharyngeal swab, presence of viral mutation(s) within the areas targeted by this assay, and inadequate number of viral copies(<138 copies/mL). A negative result must be combined with clinical observations, patient history, and epidemiological information. The expected result is Negative.  Fact Sheet for Patients:  EntrepreneurPulse.com.au  Fact Sheet for Healthcare Providers:  IncredibleEmployment.be  This test is no t yet approved or cleared by the [place] FDA and  has been authorized for detection and/or diagnosis of SARS-CoV-2 by FDA under an Emergency Use Authorization (EUA). This EUA will remain  in effect (meaning this test can  be used) for the duration of the COVID-19 declaration under Section 564(b)(1) of the Act, 21 U.S.C.section 360bbb-3(b)(1), unless the authorization is terminated  or revoked sooner.       Influenza A by PCR NEGATIVE NEGATIVE Final   Influenza B by PCR NEGATIVE NEGATIVE Final    Comment: (NOTE) The Xpert Xpress SARS-CoV-2/FLU/RSV plus assay is intended as an aid in the diagnosis of influenza from Nasopharyngeal swab specimens and should not be used as a sole basis for treatment. Nasal washings and aspirates are unacceptable for Xpert Xpress SARS-CoV-2/FLU/RSV testing.  Fact Sheet for Patients: EntrepreneurPulse.com.au  Fact Sheet for Healthcare Providers: IncredibleEmployment.be  This test is not yet approved or cleared by the [place] FDA and has been authorized for detection and/or diagnosis of SARS-CoV-2 by FDA under an Emergency Use Authorization (EUA). This EUA will remain in effect (meaning this test can be used) for the duration of the COVID-19 declaration under Section 564(b)(1) of the Act, 21 U.S.C. section 360bbb-3(b)(1), unless the authorization is terminated or revoked.  Performed at [facility], [place] [address] [place], [place] [address]   Culture, blood (routine x 2)     Status: None (Preliminary result)   Collection Time: 05/14/22  5:52 PM   Specimen: BLOOD  Result Value Ref Range Status   Specimen Description   Final    BLOOD RIGHT ANTECUBITAL Performed at [facility] [address] [place], [place] [address]    Special Requests   Final    BOTTLES DRAWN AEROBIC AND ANAEROBIC Blood Culture adequate volume Performed at [facility] [address] [place], [place] [address]    Culture   Final    NO GROWTH 3 DAYS Performed at [facility], [place] [address] [place], [place] [address]    Report Status PENDING  Incomplete  MRSA Next Gen by PCR, Nasal      Status: None   Collection Time: 05/14/22  8:33 PM   Specimen: Nasal Mucosa; Nasal Swab  Result Value Ref Range Status   MRSA by PCR Next Gen NOT DETECTED NOT DETECTED Final    Comment: (NOTE) The GeneXpert MRSA Assay (FDA approved for NASAL specimens only), is one component of a comprehensive MRSA colonization surveillance program. It is not intended to diagnose MRSA infection nor to guide or monitor treatment for MRSA infections. Test performance is not FDA approved in patients less than 48 years old. Performed at [facility], [place] [address], [place], [place] [address]   Culture, blood (routine x 2)     Status: None (Preliminary result)   Collection Time: 05/14/22  9:15 PM   Specimen: BLOOD  Result Value Ref Range Status   Specimen Description   Final    BLOOD BLOOD RIGHT HAND Performed at [facility] [address] [place], [place] [address]    Special Requests  Final    BOTTLES DRAWN AEROBIC AND ANAEROBIC Blood Culture adequate volume Performed at [facility] [address], [place], [place] [address]    Culture   Final    NO GROWTH 2 DAYS Performed at [facility] [address], [place], [place] [address]    Report Status PENDING  Incomplete         Radiology Studies: ECHOCARDIOGRAM COMPLETE  Result Date: 05/15/2022    ECHOCARDIOGRAM REPORT   Patient Name:   MALICHI PALARDY Date of Exam: 05/15/2022 Medical Rec #:  [id number]        Height:       70.0 in Accession #:    [id number]       Weight:       178.6 lb Date of Birth:  February 07, 1946        BSA:          1.989 m Patient Age:    25 years         BP:           178/64 mmHg Patient Gender: M                HR:           90 bpm. Exam Location:  Inpatient Procedure: 2D Echo, Cardiac Doppler and Color Doppler Indications:    Abnl ECG  History:        Patient has prior history of Echocardiogram examinations, most                 recent 05/17/2005. Abnormal  ECG; Risk Factors:Hypertension,                 Diabetes, Dyslipidemia and Former Smoker.  Sonographer:    Wenda Low Referring Phys: [id number] [place]  1. Left ventricular ejection fraction, by estimation, is 60 to 65%. The left ventricle has normal function. The left ventricle has no regional wall motion abnormalities. There is moderate concentric left ventricular hypertrophy. Left ventricular diastolic parameters are consistent with Grade II diastolic dysfunction (pseudonormalization).  2. Right ventricular systolic function is normal. The right ventricular size is normal. There is normal pulmonary artery systolic pressure. The estimated right ventricular systolic pressure is 02.5 mmHg.  3. Left atrial size was mildly dilated.  4. Right atrial size was mildly dilated.  5. The mitral valve is normal in structure. No evidence of mitral valve regurgitation. No evidence of mitral stenosis.  6. The aortic valve is tricuspid. There is moderate calcification of the aortic valve. Aortic valve regurgitation is not visualized. Mild aortic valve stenosis. Aortic valve mean gradient measures 12.0 mmHg.  7. The inferior vena cava is normal in size with greater than 50% respiratory variability, suggesting right atrial pressure of 3 mmHg. FINDINGS  Left Ventricle: Left ventricular ejection fraction, by estimation, is 60 to 65%. The left ventricle has normal function. The left ventricle has no regional wall motion abnormalities. The left ventricular internal cavity size was normal in size. There is  moderate concentric left ventricular hypertrophy. Left ventricular diastolic parameters are consistent with Grade II diastolic dysfunction (pseudonormalization). Right Ventricle: The right ventricular size is normal. No increase in right ventricular wall thickness. Right ventricular systolic function is normal. There is normal pulmonary artery systolic pressure. The tricuspid regurgitant velocity is 2.64  m/s, and  with an assumed right atrial pressure of 3 mmHg, the estimated right ventricular systolic pressure is 85.2 mmHg. Left Atrium: Left atrial size  was mildly dilated. Right Atrium: Right atrial size was mildly dilated. Pericardium: There is no evidence of pericardial effusion. Mitral Valve: The mitral valve is normal in structure. Mild mitral annular calcification. No evidence of mitral valve regurgitation. No evidence of mitral valve stenosis. MV peak gradient, 9.0 mmHg. The mean mitral valve gradient is 3.0 mmHg. Tricuspid Valve: The tricuspid valve is normal in structure. Tricuspid valve regurgitation is trivial. Aortic Valve: The aortic valve is tricuspid. There is moderate calcification of the aortic valve. Aortic valve regurgitation is not visualized. Mild aortic stenosis is present. Aortic valve mean gradient measures 12.0 mmHg. Aortic valve peak gradient measures 20.3 mmHg. Aortic valve area, by VTI measures 2.16 cm. Pulmonic Valve: The pulmonic valve was normal in structure. Pulmonic valve regurgitation is not visualized. Aorta: The aortic root is normal in size and structure. Venous: The inferior vena cava is normal in size with greater than 50% respiratory variability, suggesting right atrial pressure of 3 mmHg. IAS/Shunts: No atrial level shunt detected by color flow Doppler.  LEFT VENTRICLE PLAX 2D LVIDd:         4.70 cm   Diastology LVIDs:         3.40 cm   LV e' medial:    5.44 cm/s LV PW:         1.30 cm   LV E/e' medial:  23.9 LV IVS:        1.40 cm   LV e' lateral:   7.51 cm/s LVOT diam:     2.00 cm   LV E/e' lateral: 17.3 LV SV:         96 LV SV Index:   48 LVOT Area:     3.14 cm  RIGHT VENTRICLE RV Basal diam:  3.85 cm RV Mid diam:    3.60 cm RV S prime:     18.00 cm/s TAPSE (M-mode): 3.2 cm LEFT ATRIUM             Index        RIGHT ATRIUM           Index LA diam:        4.20 cm 2.11 cm/m   RA Area:     20.70 cm LA Vol (A2C):   76.2 ml 38.31 ml/m  RA Volume:   63.90 ml  32.12 ml/m LA  Vol (A4C):   66.0 ml 33.18 ml/m LA Biplane Vol: 71.0 ml 35.69 ml/m  AORTIC VALVE                     PULMONIC VALVE AV Area (Vmax):    1.91 cm      PV Vmax:       1.17 m/s AV Area (Vmean):   1.96 cm      PV Peak grad:  5.5 mmHg AV Area (VTI):     2.16 cm AV Vmax:           225.33 cm/s AV Vmean:          154.000 cm/s AV VTI:            0.446 m AV Peak Grad:      20.3 mmHg AV Mean Grad:      12.0 mmHg LVOT Vmax:         137.00 cm/s LVOT Vmean:        96.300 cm/s LVOT VTI:          0.307 m LVOT/AV VTI ratio: 0.69  AORTA Ao Root diam:  2.80 cm Ao Asc diam:  3.50 cm MITRAL VALVE                TRICUSPID VALVE MV Area (PHT): 5.79 cm     TR Peak grad:   27.9 mmHg MV Area VTI:   2.78 cm     TR Vmax:        264.00 cm/s MV Peak grad:  9.0 mmHg MV Mean grad:  3.0 mmHg     SHUNTS MV Vmax:       1.50 m/s     Systemic VTI:  0.31 m MV Vmean:      82.4 cm/s    Systemic Diam: 2.00 cm MV Decel Time: 131 msec MV E velocity: 130.00 cm/s MV A velocity: 98.50 cm/s MV E/A ratio:  1.32 Dalton McleanMD Electronically signed by Franki Monte Signature Date/Time: 05/15/2022/4:50:44 PM    Final         Scheduled Meds:  apixaban  5 mg Oral BID   bisoprolol  10 mg Oral Daily   Chlorhexidine Gluconate Cloth  6 each Topical Daily   clopidogrel  75 mg Oral q AM   hydrALAZINE  50 mg Oral TID   irbesartan  150 mg Oral Daily   mouth rinse  15 mL Mouth Rinse 4 times per day   tamsulosin  0.4 mg Oral Daily   Continuous Infusions:  azithromycin 500 mg (05/17/22 1056)   cefTRIAXone (ROCEPHIN)  IV Stopped (05/16/22 1016)     LOS: 3 days    Time spent: 35 minutes    Barb Merino, MD [facility] Pager [phone]

## 2022-05-17 NOTE — Progress Notes (Signed)
Progress Note  Patient Name: Samuel [name] Date of Encounter: 05/17/2022  [facility] Cardiologist: None   Subjective   No further afib.  Denies any CP or SOB.  Nuclear stress test from [facility] 12/2021 that showed no ischemia.    Inpatient Medications    Scheduled Meds:  apixaban  5 mg Oral BID   bisoprolol  5 mg Oral Daily   Chlorhexidine Gluconate Cloth  6 each Topical Daily   clopidogrel  75 mg Oral q AM   hydrALAZINE  50 mg Oral TID   irbesartan  150 mg Oral Daily   mouth rinse  15 mL Mouth Rinse 4 times per day   tamsulosin  0.4 mg Oral Daily   Continuous Infusions:  azithromycin Stopped (05/16/22 1142)   cefTRIAXone (ROCEPHIN)  IV Stopped (05/16/22 1016)   PRN Meds: acetaminophen **OR** acetaminophen, ipratropium-albuterol, melatonin, ondansetron **OR** ondansetron (ZOFRAN) IV, mouth rinse   Vital Signs    Vitals:   05/16/22 1800 05/16/22 2213 05/17/22 0151 05/17/22 0604  BP: (!) 149/61 (!) 159/60 (!) 155/64 (!) 159/63  Pulse: 74 71 72 67  Resp: '20 20 20 18  '$ Temp: 98.1 F (36.7 C) 99.7 F (37.6 C) 99.4 F (37.4 C) 98.2 F (36.8 C)  TempSrc: Oral Oral Oral Oral  SpO2: 96% 96% 93% 93%  Weight:      Height:        Intake/Output Summary (Last 24 hours) at 05/17/2022 5462 Last data filed at 05/16/2022 2215 Gross per 24 hour  Intake 748.19 ml  Output 150 ml  Net 598.19 ml       05/14/2022    8:18 PM 05/14/2022   12:03 PM 11/01/2021    1:01 PM  Last 3 Weights  Weight (lbs) 178 lb 9.2 oz 175 lb 0.7 oz 175 lb  Weight (kg) 81 kg 79.4 kg 79.379 kg      Telemetry    NSR- Personally Reviewed  ECG    No new EKG to review - Personally Reviewed  Physical Exam   GEN: Well nourished, well developed in no acute distress HEENT: Normal NECK: No JVD; No carotid bruits LYMPHATICS: No lymphadenopathy CARDIAC:RRR, no murmurs, rubs, gallops RESPIRATORY:  Clear to auscultation without rales, wheezing or rhonchi  ABDOMEN: Soft, non-tender,  non-distended MUSCULOSKELETAL:  No edema; No deformity  SKIN: Warm and dry NEUROLOGIC:  Alert and oriented x 3 PSYCHIATRIC:  Normal affect  Labs    High Sensitivity Troponin:   Recent Labs  Lab 05/14/22 1752 05/14/22 2115 05/15/22 0722  TROPONINIHS 257* 386* 348*       Chemistry Recent Labs  Lab 05/14/22 1210 05/15/22 0319 05/16/22 0320  NA 137 134* 135  K 4.0 4.0 3.5  CL 104 101 102  CO2 24 22 21*  GLUCOSE 117* 123* 119*  BUN 21 21 28*  CREATININE 1.29* 1.56* 1.62*  CALCIUM 9.3 8.5* 8.6*  PROT  --  6.2* 6.6  ALBUMIN  --  3.2* 3.3*  AST  --  17 19  ALT  --  14 15  ALKPHOS  --  35* 39  BILITOT  --  0.8 0.8  GFRNONAA 57* 45* 43*  ANIONGAP '9 11 12      '$ Hematology Recent Labs  Lab 05/15/22 0319 05/16/22 0320 05/17/22 0414  WBC 13.6* 11.4* 8.2  RBC 3.11* 2.95* 2.61*  HGB 10.3* 9.9* 8.7*  HCT 31.1* 29.4* 26.0*  MCV 100.0 99.7 99.6  MCH 33.1 33.6 33.3  MCHC 33.1 33.7  33.5  RDW 13.1 13.0 12.9  PLT 190 196 171     BNP Recent Labs  Lab 05/14/22 1210  BNP 951.2*      DDimer  Recent Labs  Lab 05/14/22 1210  DDIMER 0.84*      Radiology    ECHOCARDIOGRAM COMPLETE  Result Date: 05/15/2022    ECHOCARDIOGRAM REPORT   Patient Name:   Samuel [name] Date of Exam: 05/15/2022 Medical Rec #:  [id number]        Height:       70.0 in Accession #:    [id number]       Weight:       178.6 lb Date of Birth:  August 31, 1945        BSA:          1.989 m Patient Age:    76 years         BP:           178/64 mmHg Patient Gender: M                HR:           90 bpm. Exam Location:  Inpatient Procedure: 2D Echo, Cardiac Doppler and Color Doppler Indications:    Abnl ECG  History:        Patient has prior history of Echocardiogram examinations, most                 recent 05/17/2005. Abnormal ECG; Risk Factors:Hypertension,                 Diabetes, Dyslipidemia and Former Smoker.  Sonographer:    Wenda Low Referring Phys: [id number] [place]  1.  Left ventricular ejection fraction, by estimation, is 60 to 65%. The left ventricle has normal function. The left ventricle has no regional wall motion abnormalities. There is moderate concentric left ventricular hypertrophy. Left ventricular diastolic parameters are consistent with Grade II diastolic dysfunction (pseudonormalization).  2. Right ventricular systolic function is normal. The right ventricular size is normal. There is normal pulmonary artery systolic pressure. The estimated right ventricular systolic pressure is 33.2 mmHg.  3. Left atrial size was mildly dilated.  4. Right atrial size was mildly dilated.  5. The mitral valve is normal in structure. No evidence of mitral valve regurgitation. No evidence of mitral stenosis.  6. The aortic valve is tricuspid. There is moderate calcification of the aortic valve. Aortic valve regurgitation is not visualized. Mild aortic valve stenosis. Aortic valve mean gradient measures 12.0 mmHg.  7. The inferior vena cava is normal in size with greater than 50% respiratory variability, suggesting right atrial pressure of 3 mmHg. FINDINGS  Left Ventricle: Left ventricular ejection fraction, by estimation, is 60 to 65%. The left ventricle has normal function. The left ventricle has no regional wall motion abnormalities. The left ventricular internal cavity size was normal in size. There is  moderate concentric left ventricular hypertrophy. Left ventricular diastolic parameters are consistent with Grade II diastolic dysfunction (pseudonormalization). Right Ventricle: The right ventricular size is normal. No increase in right ventricular wall thickness. Right ventricular systolic function is normal. There is normal pulmonary artery systolic pressure. The tricuspid regurgitant velocity is 2.64 m/s, and  with an assumed right atrial pressure of 3 mmHg, the estimated right ventricular systolic pressure is 95.1 mmHg. Left Atrium: Left atrial size was mildly dilated. Right  Atrium: Right atrial size was mildly dilated. Pericardium: There is no evidence of pericardial effusion. Mitral Valve:  The mitral valve is normal in structure. Mild mitral annular calcification. No evidence of mitral valve regurgitation. No evidence of mitral valve stenosis. MV peak gradient, 9.0 mmHg. The mean mitral valve gradient is 3.0 mmHg. Tricuspid Valve: The tricuspid valve is normal in structure. Tricuspid valve regurgitation is trivial. Aortic Valve: The aortic valve is tricuspid. There is moderate calcification of the aortic valve. Aortic valve regurgitation is not visualized. Mild aortic stenosis is present. Aortic valve mean gradient measures 12.0 mmHg. Aortic valve peak gradient measures 20.3 mmHg. Aortic valve area, by VTI measures 2.16 cm. Pulmonic Valve: The pulmonic valve was normal in structure. Pulmonic valve regurgitation is not visualized. Aorta: The aortic root is normal in size and structure. Venous: The inferior vena cava is normal in size with greater than 50% respiratory variability, suggesting right atrial pressure of 3 mmHg. IAS/Shunts: No atrial level shunt detected by color flow Doppler.  LEFT VENTRICLE PLAX 2D LVIDd:         4.70 cm   Diastology LVIDs:         3.40 cm   LV e' medial:    5.44 cm/s LV PW:         1.30 cm   LV E/e' medial:  23.9 LV IVS:        1.40 cm   LV e' lateral:   7.51 cm/s LVOT diam:     2.00 cm   LV E/e' lateral: 17.3 LV SV:         96 LV SV Index:   48 LVOT Area:     3.14 cm  RIGHT VENTRICLE RV Basal diam:  3.85 cm RV Mid diam:    3.60 cm RV S prime:     18.00 cm/s TAPSE (M-mode): 3.2 cm LEFT ATRIUM             Index        RIGHT ATRIUM           Index LA diam:        4.20 cm 2.11 cm/m   RA Area:     20.70 cm LA Vol (A2C):   76.2 ml 38.31 ml/m  RA Volume:   63.90 ml  32.12 ml/m LA Vol (A4C):   66.0 ml 33.18 ml/m LA Biplane Vol: 71.0 ml 35.69 ml/m  AORTIC VALVE                     PULMONIC VALVE AV Area (Vmax):    1.91 cm      PV Vmax:       1.17 m/s AV  Area (Vmean):   1.96 cm      PV Peak grad:  5.5 mmHg AV Area (VTI):     2.16 cm AV Vmax:           225.33 cm/s AV Vmean:          154.000 cm/s AV VTI:            0.446 m AV Peak Grad:      20.3 mmHg AV Mean Grad:      12.0 mmHg LVOT Vmax:         137.00 cm/s LVOT Vmean:        96.300 cm/s LVOT VTI:          0.307 m LVOT/AV VTI ratio: 0.69  AORTA Ao Root diam: 2.80 cm Ao Asc diam:  3.50 cm MITRAL VALVE  TRICUSPID VALVE MV Area (PHT): 5.79 cm     TR Peak grad:   27.9 mmHg MV Area VTI:   2.78 cm     TR Vmax:        264.00 cm/s MV Peak grad:  9.0 mmHg MV Mean grad:  3.0 mmHg     SHUNTS MV Vmax:       1.50 m/s     Systemic VTI:  0.31 m MV Vmean:      82.4 cm/s    Systemic Diam: 2.00 cm MV Decel Time: 131 msec MV E velocity: 130.00 cm/s MV A velocity: 98.50 cm/s MV E/A ratio:  1.32 Dalton McleanMD Electronically signed by Franki Monte Signature Date/Time: 05/15/2022/4:50:44 PM    Final     Cardiac Studies  2D echo 05/15/2022 IMPRESSIONS    1. Left ventricular ejection fraction, by estimation, is 60 to 65%. The  left ventricle has normal function. The left ventricle has no regional  wall motion abnormalities. There is moderate concentric left ventricular  hypertrophy. Left ventricular  diastolic parameters are consistent with Grade II diastolic dysfunction  (pseudonormalization).   2. Right ventricular systolic function is normal. The right ventricular  size is normal. There is normal pulmonary artery systolic pressure. The  estimated right ventricular systolic pressure is 27.2 mmHg.   3. Left atrial size was mildly dilated.   4. Right atrial size was mildly dilated.   5. The mitral valve is normal in structure. No evidence of mitral valve  regurgitation. No evidence of mitral stenosis.   6. The aortic valve is tricuspid. There is moderate calcification of the  aortic valve. Aortic valve regurgitation is not visualized. Mild aortic  valve stenosis. Aortic valve mean gradient measures  12.0 mmHg.   7. The inferior vena cava is normal in size with greater than 50%  respiratory variability, suggesting right atrial pressure of 3 mmHg.     Echo from 09/18/21  at [place]   Name: Samuel [name], Samuel [name] Study Date: 09/18/2021 08:31 AM Height: 70 in MRN: [id number] Patient Location: [facility]/MED/CP/CARDIO/ECHO/01 Weight: 184 lb DOB: December 28, 1945 Gender: Male BSA: 2.0 m2 Age: 19 yrs BP: 170/88 mmHg Reason For Study: Murmur   CP Order Number: [id number] Referring Physician: [name]  Interpretation Summary The left ventricle is normal in size. There is mild concentric left ventricular hypertrophy. EF= 57.3% by 2D biplane method. The right ventricle is normal in size and function. The left atrial size is normal. The aortic valve is trileaflet. Moderate thickening of the right and non-coronary cusps. No hemodynamically significant valvular aortic stenosis. There is mild mitral annular calcification. There is trace to mild mitral regurgitation. There is mild tricuspid regurgitation. Right ventricular systolic pressure is normal.  Left Ventricle: The left ventricle is normal in size. There is mild concentric left ventricular hypertrophy. EF= 57.3% by 2D biplane method.  Right Ventricle: The right ventricle is normal in size and function.  Left Atrium The left atrial size is normal.  Right Atrium: Right atrial size is normal.  Aortic Valve: The aortic valve is trileaflet. Moderate thickening of the right and non-coronary cusps. No hemodynamically significant valvular aortic stenosis.  Pulmonic Valve: The pulmonic valve is grossly normal as visualized. Trace to mild pulmonic valvular regurgitation.  Mitral Valve: There is mild mitral annular calcification. There is trace to mild mitral regurgitation.  Tricuspid Valve: The tricuspid valve is grossly normal. There is mild tricuspid regurgitation. Right ventricular systolic pressure is normal.  Venous: The IVC is normal  in  size with an inspiratory collapse of greater then 50%, suggesting normal right atrial pressure.  Great Vessels: The aortic root is normal size.  Pericardium/Pleural: There is no pericardial effusion.     Nuclear stress test 12/17/21  done at the [facility] [id number] DOB-Jan 26, 1946 M Exm Date: Dec 25, 2021'@08'$ :00 Req Phys: Delma Post Loc: [facility]/MED/CARDIO/NUC/STRESS ([place] MED[place] Service: Unknown    (Case [id number] COMPLETE) MYOCARDIAL PERFUSION(SPECT)EX.RED(NM Detailed) [id number] Reason for Study: Hypertensive Heart Disease without Heart  (Case [phone] COMPLETE) TC99M TETROFOSMIN (NM Detailed) [id number]  (Case [id number] COMPLETE) TC99M TETROFOSMIN-2 (NM Detailed) [id number]  Clinical History:  Report Status: Verified Date Reported: Dec 25, 2021 Date Verified: Dec 25, 2021 Verifier E-Sig:/ES/JULIE A MOYERS, MD  Report: History: Hypertensive Heart Disease without Heart  Comparison: None  Findings: Myocardial perfusion study was performed. Images were interpreted on the cardiac workstation.  Pharmacologic stress test was performed under the supervision of the cardiologist. Please see cardiologist notes for details.  Rest images were obtained utilizing 1.6 mCi of technetium 17mMyoview IV. Stress images were obtained utilizing 32.8 mCi of technetium 910myoview IV.  No fixed defects or areas of reversible ischemia are seen.  TID is 1.06.  Ejection fraction is calculated at 63% during stress.  Calculated end diastolic volume is 11638L.  --------------------------------------------------------    Impression:   No evidence of reversible ischemia.  Electronically Signed By: JuKathryne Erikssonlectronically Signed On: 12/25/2021 4:13 PM    Patient Profile     [age].o. male  with a hx of PVD, s/p LSFA stent 2012, R CEA 2006,  and HTN. Intolerance to statins, hx of endovascular aneurysm repair abd in 2020,  chronic mesenteric ischemia with stent to SMA-2021 who is being seen 05/15/2022 for the evaluation of abnormal EKG with elevated troponin at the request of Dr. GhSloan Leiter  Assessment & Plan    #Acute hypoxemic Respiratory failure -related to multifocal PNA -chest CT showed bilateral pleural effusions with elevated BNP but suspect most of this is related to his PNA -2D echo with normal LVF with diastolic dysfunction -continue antibx per TRH   #Elevated Troponin -elevated (257>>386>>348) but with flat trend and not c/w ACS  -in setting of normal LVF (echo this admit) this is likely related to demand ischemia from acute hypoxemic respiratory failure and PNA -[facility] and normal LVF -he has not had any angina>>no further inpt ischemic workup and [facility]   #HTN -BP continues to be high -home meds include Hydralazine '50mg'$  TID, Lisinopril HCT 10-12.'5mg'$  daily -Stopped Lisinopril HCT and started Irbesartan '150mg'$  daily  -no diuretic at this time due to AKI -BMET pending this am -will not increase ARB further until we see where SCR is going to trend -continue Hydralazine '50mg'$  TID -Increase Bisoprolol to '10mg'$  daily -goal would be to uptitrate B1 selective BB as well as ARB (as renal function allows) and get off Hydralazine to increase compliance.   -could also consider Amlodipine if needed   #PAD -s/p LSFA stent 2012, RCEA 2006, endovascular AAA repair 2020, stent to SMA for chronic mesenteric ischemia 2021 -he is statin intolerant -LDL this admit good at 53  # [place] but now in NSR with no reoccurence -CHADS2VASC score is 4 -this occurred in the setting of PNA but he was unaware of his afib at the time so he could be  having silent PAF at home -I think the best course of action at this time is to transition to Eliquis '5mg'$  BID and then can consider outpatient event monitor to  assess for any further PAF -increasing Bisoprolol to '10mg'$  daily for better Bp control -will refer to afib clinic a discharge     For questions or updates, please contact Wanda Please consult [email or web address] for contact info under        Signed, Fransico Him, MD  05/17/2022, 8:21 AM

## 2022-05-17 NOTE — Plan of Care (Signed)
  Problem: Activity: Goal: Ability to tolerate increased activity will improve Outcome: Progressing   Problem: Respiratory: Goal: Ability to maintain adequate ventilation will improve Outcome: Progressing Goal: Ability to maintain a clear airway will improve Outcome: Progressing   Problem: Education: Goal: Knowledge of General Education information will improve Description: Including pain rating scale, medication(s)/side effects and non-pharmacologic comfort measures Outcome: Progressing   Problem: Activity: Goal: Risk for activity intolerance will decrease Outcome: Progressing   Problem: Coping: Goal: Level of anxiety will decrease Outcome: Progressing   Problem: Pain Managment: Goal: General experience of comfort will improve Outcome: Progressing   Problem: Safety: Goal: Ability to remain free from injury will improve Outcome: Progressing   Problem: Skin Integrity: Goal: Risk for impaired skin integrity will decrease Outcome: Progressing

## 2022-05-17 NOTE — Progress Notes (Signed)
Mobility Specialist - Progress Note   05/17/22 0927  Oxygen Therapy  O2 Device Nasal Cannula  O2 Flow Rate (L/min) 3 L/min  Mobility  Activity Ambulated independently in hallway  Level of Assistance Independent  Assistive Device None  Distance Ambulated (ft) 350 ft  Activity Response Tolerated well  Mobility Referral Yes  $Mobility charge 1 Mobility   Pt received EOB and agreeable to mobility. Nurse requested Mobility Specialist to perform oxygen saturation test with pt which includes removing pt from oxygen both at rest and while ambulating.  Below are the results from that testing.     Patient Saturations on Room Air at Rest = spO2 77% Patient Saturations on Room Air while Ambulating = sp02 75% .  Rested and performed pursed lip breathing for 1 minute with sp02 at 91%. Patient Saturations on 1 Liters of oxygen while Ambulating = sp02 76%  At end of testing pt left in room on 4  Liters of oxygen.  Reported results to nurse. Pt had no complaints during mobility session. Pt to EOB after session with all needs met.     [facility]

## 2022-05-18 DIAGNOSIS — J9601 Acute respiratory failure with hypoxia: Secondary | ICD-10-CM | POA: Diagnosis not present

## 2022-05-18 DIAGNOSIS — R7989 Other specified abnormal findings of blood chemistry: Secondary | ICD-10-CM | POA: Diagnosis not present

## 2022-05-18 DIAGNOSIS — I480 Paroxysmal atrial fibrillation: Secondary | ICD-10-CM | POA: Diagnosis not present

## 2022-05-18 DIAGNOSIS — I10 Essential (primary) hypertension: Secondary | ICD-10-CM | POA: Diagnosis not present

## 2022-05-18 LAB — CBC
HCT: 26.3 % — ABNORMAL LOW (ref 39.0–52.0)
Hemoglobin: 8.7 g/dL — ABNORMAL LOW (ref 13.0–17.0)
MCH: 33.1 pg (ref 26.0–34.0)
MCHC: 33.1 g/dL (ref 30.0–36.0)
MCV: 100 fL (ref 80.0–100.0)
Platelets: 175 10*3/uL (ref 150–400)
RBC: 2.63 MIL/uL — ABNORMAL LOW (ref 4.22–5.81)
RDW: 12.9 % (ref 11.5–15.5)
WBC: 7.4 10*3/uL (ref 4.0–10.5)
nRBC: 0 % (ref 0.0–0.2)

## 2022-05-18 LAB — BASIC METABOLIC PANEL
Anion gap: 10 (ref 5–15)
BUN: 36 mg/dL — ABNORMAL HIGH (ref 8–23)
CO2: 21 mmol/L — ABNORMAL LOW (ref 22–32)
Calcium: 8.7 mg/dL — ABNORMAL LOW (ref 8.9–10.3)
Chloride: 108 mmol/L (ref 98–111)
Creatinine, Ser: 1.86 mg/dL — ABNORMAL HIGH (ref 0.61–1.24)
GFR, Estimated: 37 mL/min — ABNORMAL LOW (ref >60)
Glucose, Bld: 123 mg/dL — ABNORMAL HIGH (ref 70–99)
Potassium: 3.8 mmol/L (ref 3.5–5.1)
Sodium: 139 mmol/L (ref 135–145)

## 2022-05-18 LAB — MAGNESIUM: Magnesium: 1.9 mg/dL (ref 1.7–2.4)

## 2022-05-18 MED ORDER — HYDRALAZINE HCL 20 MG/ML IJ SOLN
5.0000 mg | Freq: Once | INTRAMUSCULAR | Status: AC | PRN
  Administered 2022-05-19: 5 mg via INTRAVENOUS
  Filled 2022-05-18: qty 1

## 2022-05-18 MED ORDER — MELATONIN 5 MG PO TABS
5.0000 mg | ORAL_TABLET | Freq: Once | ORAL | Status: AC
  Administered 2022-05-18: 5 mg via ORAL
  Filled 2022-05-18: qty 1

## 2022-05-18 MED ORDER — POTASSIUM CHLORIDE CRYS ER 20 MEQ PO TBCR
20.0000 meq | EXTENDED_RELEASE_TABLET | Freq: Once | ORAL | Status: AC
  Administered 2022-05-18: 20 meq via ORAL
  Filled 2022-05-18: qty 1

## 2022-05-18 MED ORDER — BISOPROLOL FUMARATE 10 MG PO TABS: 10.0000 mg | ORAL_TABLET | Freq: Every day | ORAL | 0 refills | Status: AC

## 2022-05-18 MED ORDER — HYDRALAZINE HCL 50 MG PO TABS
100.0000 mg | ORAL_TABLET | Freq: Three times a day (TID) | ORAL | Status: DC
  Administered 2022-05-18 – 2022-05-20 (×7): 100 mg via ORAL
  Filled 2022-05-18 (×8): qty 2

## 2022-05-18 MED ORDER — AMOXICILLIN-POT CLAVULANATE 875-125 MG PO TABS: 1.0000 | ORAL_TABLET | Freq: Two times a day (BID) | ORAL | 0 refills | Status: AC

## 2022-05-18 MED ORDER — APIXABAN 5 MG PO TABS: 5.0000 mg | ORAL_TABLET | Freq: Two times a day (BID) | ORAL | 2 refills | Status: AC

## 2022-05-18 MED ORDER — HYDRALAZINE HCL 100 MG PO TABS: 100.0000 mg | ORAL_TABLET | Freq: Three times a day (TID) | ORAL | 0 refills | Status: AC

## 2022-05-18 NOTE — Progress Notes (Signed)
PROGRESS NOTE    Samuel [name]  BPZ:[id number] DOB: 1945/08/13 DOA: 05/14/2022 PCP: [name], MD    Brief Narrative:  76 year old with history of AAA, peripheral arterial disease, essential hypertension, hyperlipidemia presented from urgent care with progressive worsening of shortness of breath, productive cough with blood-tinge, fatigue and malaise.  At urgent care, oxygen saturation 70% on room air.  Initially afebrile.  Heart rate 95, respiration 20.  Blood pressure is 212/97.  WBC count 12.6.  D-dimer 0.8.  Chest x-ray with bilateral lower lobe predominant airspace disease.  CTA with no PE, patchy bilateral airspace opacification likely multifocal pneumonia.  Admitted with IV antibiotics.  Initially on BiPAP and on high flow oxygen.  COVID and influenza negative. 12/7, overnight with new onset rapid A-fib patient fairly stable.  Started on heparin infusion. Clinically improving, still on oxygen and cardiac arrhythmias.   Assessment & Plan:   Acute hypoxemic respiratory failure, respiratory distress secondary to multifocal pneumonia: Severe sepsis present on admission secondary to pneumonia.  Hypoxemia, leukocytosis, tachypnea.  Currently remains on ceftriaxone and azithromycin.  Clinically improving.  Now on 4 L oxygen with mobility. Continue Chest physiotherapy, incentive spirometry, deep breathing exercises, sputum induction, mucolytic's and bronchodilators. Sputum cultures, blood cultures, Legionella and streptococcal antigen.  Negative so far. Supplemental oxygen to keep saturations more than 90%.  Echocardiogram with normal ejection fraction. Patient will be going home with oxygen supplementation.  New onset atrial fibrillation: 12/7 overnight went into rapid A-fib.  Patient was asymptomatic.  Spontaneously converted to sinus rhythm with 5 mg of IV metoprolol.   Currently on bisoprolol.  On Eliquis.  Rate controlled in sinus rhythm now. Intermittent NSVT noted, electrolyte  replacement.  Avoid hypoxemia.  Abnormal EKG, elevated troponins: Troponins are flat.  demand ischemia.  Echocardiogram with no new findings. Patient had Lexiscan about a month ago and reportedly no reversible ischemia.  Acute kidney injury: Probably hemodynamic mediated.  Patient also received angiogram with intravenous dye.  Will monitor.  Blood pressures are adequate.  Recheck tomorrow morning. Stopping ARB and starting on increased dose of hydralazine.  Chronic medical issues including Hypertension, medication changed to hydralazine , bisoprolol .  Lisinopril was changed to irbesartan.  Holding irbesartan due to AKI. Hyperlipidemia, Peripheral arterial disease, status post stents to SMA and endovascular aneurysm repair.  Currently on Plavix. Stable.   DVT prophylaxis: SCDs Start: 05/14/22 1540 apixaban (ELIQUIS) tablet 5 mg   Code Status: Full code Family Communication: Wife at the bedside Disposition Plan: Status is: Inpatient Remains inpatient appropriate because: On significant oxygen requirement, IV antibiotics.     Consultants:  Cardiology  Procedures:  None  Antimicrobials:  Rocephin and azithromycin 12/5---   Subjective:  Patient was seen and examined.  No overnight events.  He is walking around with oxygen tank.  He was eager to go home.  Overnight telemetry noted with frequent episodes of NSVT's.  Patient was also trying to go off oxygen and see if he tolerates.  Denies any chest pain or palpitations.  Afebrile.  Very motivated and mobilizing around.  Objective: Vitals:   05/17/22 2024 05/17/22 2025 05/17/22 2027 05/18/22 0533  BP: (!) 173/72 (!) 179/68 (!) 185/72 (!) 151/65  Pulse: 69 68 69 67  Resp: 20   (!) 22  Temp: 98.7 F (37.1 C)   98.6 F (37 C)  TempSrc: Oral   Oral  SpO2: 94%   91%  Weight:      Height:  Intake/Output Summary (Last 24 hours) at 05/18/2022 1204 Last data filed at 05/18/2022 0900 Gross per 24 hour  Intake 120 ml   Output --  Net 120 ml   Filed Weights   05/14/22 1203 05/14/22 2018  Weight: 79.4 kg 81 kg    Examination:  General exam: Appears calm and comfortable, able to talk in complete sentences.  Up about and walking. Respiratory system: Clear to auscultation. Respiratory effort normal.  No added sounds. SpO2: 91 % O2 Flow Rate (L/min): 3 L/min FiO2 (%): 60 %  Cardiovascular system: S1 & S2 heard, RRR. No pedal edema. Gastrointestinal system: Abdomen is nondistended, soft and nontender. No organomegaly or masses felt. Normal bowel sounds heard. Central nervous system: Alert and oriented. No focal neurological deficits. Extremities: Symmetric 5 x 5 power. Skin: No rashes, lesions or ulcers Psychiatry: Judgement and insight appear normal. Mood & affect appropriate.     Data Reviewed: I have personally reviewed following labs and imaging studies  CBC: Recent Labs  Lab 05/14/22 1210 05/15/22 0319 05/16/22 0320 05/17/22 0414 05/18/22 0419  WBC 12.6* 13.6* 11.4* 8.2 7.4  NEUTROABS 10.2*  --  8.8*  --   --   HGB 11.6* 10.3* 9.9* 8.7* 8.7*  HCT 34.6* 31.1* 29.4* 26.0* 26.3*  MCV 98.9 100.0 99.7 99.6 100.0  PLT 197 190 196 171 086   Basic Metabolic Panel: Recent Labs  Lab 05/14/22 1210 05/15/22 0319 05/16/22 0320 05/18/22 0419  NA 137 134* 135 139  K 4.0 4.0 3.5 3.8  CL 104 101 102 108  CO2 24 22 21* 21*  GLUCOSE 117* 123* 119* 123*  BUN 21 21 28* 36*  CREATININE 1.29* 1.56* 1.62* 1.86*  CALCIUM 9.3 8.5* 8.6* 8.7*  MG  --   --  1.9 1.9  PHOS  --   --  3.6  --    GFR: Estimated Creatinine Clearance: 33.8 mL/min (A) (by C-G formula based on SCr of 1.86 mg/dL (H)). Liver Function Tests: Recent Labs  Lab 05/15/22 0319 05/16/22 0320  AST 17 19  ALT 14 15  ALKPHOS 35* 39  BILITOT 0.8 0.8  PROT 6.2* 6.6  ALBUMIN 3.2* 3.3*   No results for input(s): "LIPASE", "AMYLASE" in the last 168 hours. No results for input(s): "AMMONIA" in the last 168 hours. Coagulation  Profile: No results for input(s): "INR", "PROTIME" in the last 168 hours. Cardiac Enzymes: No results for input(s): "CKTOTAL", "CKMB", "CKMBINDEX", "TROPONINI" in the last 168 hours. BNP (last 3 results) No results for input(s): "PROBNP" in the last 8760 hours. HbA1C: No results for input(s): "HGBA1C" in the last 72 hours. CBG: Recent Labs  Lab 05/16/22 1143  GLUCAP 103*   Lipid Profile: Recent Labs    05/17/22 0414  CHOL 99  HDL 34*  LDLCALC 53  TRIG 61  CHOLHDL 2.9   Thyroid Function Tests: No results for input(s): "TSH", "T4TOTAL", "FREET4", "T3FREE", "THYROIDAB" in the last 72 hours. Anemia Panel: No results for input(s): "VITAMINB12", "FOLATE", "FERRITIN", "TIBC", "IRON", "RETICCTPCT" in the last 72 hours. Sepsis Labs: Recent Labs  Lab 05/14/22 1752 05/14/22 2115  LATICACIDVEN 1.2 1.4    Recent Results (from the past 240 hour(s))  Resp Panel by RT-PCR (Flu A&B, Covid) Anterior Nasal Swab     Status: None   Collection Time: 05/14/22 12:11 PM   Specimen: Anterior Nasal Swab  Result Value Ref Range Status   SARS Coronavirus 2 by RT PCR NEGATIVE NEGATIVE Final    Comment: (NOTE) SARS-CoV-2 target  nucleic acids are NOT DETECTED.  The SARS-CoV-2 RNA is generally detectable in upper respiratory specimens during the acute phase of infection. The lowest concentration of SARS-CoV-2 viral copies this assay can detect is 138 copies/mL. A negative result does not preclude SARS-Cov-2 infection and should not be used as the sole basis for treatment or other patient management decisions. A negative result may occur with  improper specimen collection/handling, submission of specimen other than nasopharyngeal swab, presence of viral mutation(s) within the areas targeted by this assay, and inadequate number of viral copies(<138 copies/mL). A negative result must be combined with clinical observations, patient history, and epidemiological information. The expected result is  Negative.  Fact Sheet for Patients:  EntrepreneurPulse.com.au  Fact Sheet for Healthcare Providers:  IncredibleEmployment.be  This test is no t yet approved or cleared by the [place] FDA and  has been authorized for detection and/or diagnosis of SARS-CoV-2 by FDA under an Emergency Use Authorization (EUA). This EUA will remain  in effect (meaning this test can be used) for the duration of the COVID-19 declaration under Section 564(b)(1) of the Act, 21 U.S.C.section 360bbb-3(b)(1), unless the authorization is terminated  or revoked sooner.       Influenza A by PCR NEGATIVE NEGATIVE Final   Influenza B by PCR NEGATIVE NEGATIVE Final    Comment: (NOTE) The Xpert Xpress SARS-CoV-2/FLU/RSV plus assay is intended as an aid in the diagnosis of influenza from Nasopharyngeal swab specimens and should not be used as a sole basis for treatment. Nasal washings and aspirates are unacceptable for Xpert Xpress SARS-CoV-2/FLU/RSV testing.  Fact Sheet for Patients: EntrepreneurPulse.com.au  Fact Sheet for Healthcare Providers: IncredibleEmployment.be  This test is not yet approved or cleared by the [place] FDA and has been authorized for detection and/or diagnosis of SARS-CoV-2 by FDA under an Emergency Use Authorization (EUA). This EUA will remain in effect (meaning this test can be used) for the duration of the COVID-19 declaration under Section 564(b)(1) of the Act, 21 U.S.C. section 360bbb-3(b)(1), unless the authorization is terminated or revoked.  Performed at [facility], [place] [address], [place], [place] [address]   Culture, blood (routine x 2)     Status: None (Preliminary result)   Collection Time: 05/14/22  5:52 PM   Specimen: BLOOD  Result Value Ref Range Status   Specimen Description   Final    BLOOD RIGHT ANTECUBITAL Performed at [facility]  [address], [place], [place] [address]    Special Requests   Final    BOTTLES DRAWN AEROBIC AND ANAEROBIC Blood Culture adequate volume Performed at [facility] [address] [place], [place] [address]    Culture   Final    NO GROWTH 4 DAYS Performed at [facility], [place] [address], [place], [place] [address]    Report Status PENDING  Incomplete  MRSA Next Gen by PCR, Nasal     Status: None   Collection Time: 05/14/22  8:33 PM   Specimen: Nasal Mucosa; Nasal Swab  Result Value Ref Range Status   MRSA by PCR Next Gen NOT DETECTED NOT DETECTED Final    Comment: (NOTE) The GeneXpert MRSA Assay (FDA approved for NASAL specimens only), is one component of a comprehensive MRSA colonization surveillance program. It is not intended to diagnose MRSA infection nor to guide or monitor treatment for MRSA infections. Test performance is not FDA approved in patients less than 62 years old. Performed at [facility], [place] [address], [place], [place] [address]   Culture, blood (routine x 2)     Status: None (Preliminary result)   Collection Time: 05/14/22  9:15 PM   Specimen: BLOOD  Result Value Ref Range Status   Specimen Description   Final    BLOOD BLOOD RIGHT HAND Performed at [facility] [address], [place], [place] [address]    Special Requests   Final    BOTTLES DRAWN AEROBIC AND ANAEROBIC Blood Culture adequate volume Performed at [facility] [address], [place], [place] [address]    Culture   Final    NO GROWTH 3 DAYS Performed at [facility], [place] [address] [place], [place] [address]    Report Status PENDING  Incomplete         Radiology Studies: No results found.      Scheduled Meds:  apixaban  5 mg Oral BID   bisoprolol  10 mg Oral Daily   Chlorhexidine Gluconate Cloth  6 each Topical Daily   clopidogrel  75 mg Oral q AM   hydrALAZINE  100 mg Oral TID   mouth  rinse  15 mL Mouth Rinse 4 times per day   potassium chloride  20 mEq Oral Once   tamsulosin  0.4 mg Oral Daily   Continuous Infusions:  azithromycin 500 mg (05/17/22 1056)   cefTRIAXone (ROCEPHIN)  IV 2 g (05/17/22 1113)     LOS: 4 days    Time spent: 35 minutes    Barb Merino, MD [facility] Pager [phone]

## 2022-05-18 NOTE — Progress Notes (Signed)
Copied from chart dated 120823/  Patient needs o2 due to hypoxia whiith exertion. Patient Saturations on Room Air at Rest = spO2 77% Patient Saturations on Room Air while Ambulating = sp02 75% .  Rested and performed pursed lip breathing for 1 minute with sp02 at 91%. Patient Saturations on 1 Liters of oxygen while Ambulating = sp02 76%   At end of testing pt left in room on 4  Liters of oxygen.

## 2022-05-18 NOTE — Progress Notes (Signed)
Progress Note  Patient Name: Samuel [name] Date of Encounter: 05/18/2022  [facility] Cardiologist: None   Subjective   Nuclear stress test from [place] 12/2021 that showed no ischemia. Denies any chest pain or SOB.  Tele shows no further PAF but had 18 beat run of NSVT   Inpatient Medications    Scheduled Meds:  apixaban  5 mg Oral BID   bisoprolol  10 mg Oral Daily   Chlorhexidine Gluconate Cloth  6 each Topical Daily   clopidogrel  75 mg Oral q AM   hydrALAZINE  50 mg Oral TID   irbesartan  150 mg Oral Daily   mouth rinse  15 mL Mouth Rinse 4 times per day   tamsulosin  0.4 mg Oral Daily   Continuous Infusions:  azithromycin 500 mg (05/17/22 1056)   cefTRIAXone (ROCEPHIN)  IV 2 g (05/17/22 1113)   PRN Meds: acetaminophen **OR** acetaminophen, ipratropium-albuterol, ondansetron **OR** ondansetron (ZOFRAN) IV, mouth rinse   Vital Signs    Vitals:   05/17/22 2024 05/17/22 2025 05/17/22 2027 05/18/22 0533  BP: (!) 173/72 (!) 179/68 (!) 185/72 (!) 151/65  Pulse: 69 68 69 67  Resp: 20   (!) 22  Temp: 98.7 F (37.1 C)   98.6 F (37 C)  TempSrc: Oral   Oral  SpO2: 94%   91%  Weight:      Height:       No intake or output data in the 24 hours ending 05/18/22 0932     05/14/2022    8:18 PM 05/14/2022   12:03 PM 11/01/2021    1:01 PM  Last 3 Weights  Weight (lbs) 178 lb 9.2 oz 175 lb 0.7 oz 175 lb  Weight (kg) 81 kg 79.4 kg 79.379 kg      Telemetry    NSR with 18 beat run of NSVT- Personally Reviewed  ECG    No new EKG to review - Personally Reviewed  Physical Exam   GEN: Well nourished, well developed in no acute distress HEENT: Normal NECK: No JVD; No carotid bruits LYMPHATICS: No lymphadenopathy CARDIAC:RRR, no murmurs, rubs, gallops RESPIRATORY:  Clear to auscultation without rales, wheezing or rhonchi  ABDOMEN: Soft, non-tender, non-distended MUSCULOSKELETAL:  No edema; No deformity  SKIN: Warm and dry NEUROLOGIC:  Alert and oriented x  3 PSYCHIATRIC:  Normal affect  Labs    High Sensitivity Troponin:   Recent Labs  Lab 05/14/22 1752 05/14/22 2115 05/15/22 0722  TROPONINIHS 257* 386* 348*       Chemistry Recent Labs  Lab 05/15/22 0319 05/16/22 0320 05/18/22 0419  NA 134* 135 139  K 4.0 3.5 3.8  CL 101 102 108  CO2 22 21* 21*  GLUCOSE 123* 119* 123*  BUN 21 28* 36*  CREATININE 1.56* 1.62* 1.86*  CALCIUM 8.5* 8.6* 8.7*  PROT 6.2* 6.6  --   ALBUMIN 3.2* 3.3*  --   AST 17 19  --   ALT 14 15  --   ALKPHOS 35* 39  --   BILITOT 0.8 0.8  --   GFRNONAA 45* 43* 37*  ANIONGAP '11 12 10      '$ Hematology Recent Labs  Lab 05/16/22 0320 05/17/22 0414 05/18/22 0419  WBC 11.4* 8.2 7.4  RBC 2.95* 2.61* 2.63*  HGB 9.9* 8.7* 8.7*  HCT 29.4* 26.0* 26.3*  MCV 99.7 99.6 100.0  MCH 33.6 33.3 33.1  MCHC 33.7 33.5 33.1  RDW 13.0 12.9 12.9  PLT 196 171 175  BNP Recent Labs  Lab 05/14/22 1210  BNP 951.2*      DDimer  Recent Labs  Lab 05/14/22 1210  DDIMER 0.84*      Radiology    No results found.  Cardiac Studies  2D echo 05/15/2022 IMPRESSIONS    1. Left ventricular ejection fraction, by estimation, is 60 to 65%. The  left ventricle has normal function. The left ventricle has no regional  wall motion abnormalities. There is moderate concentric left ventricular  hypertrophy. Left ventricular  diastolic parameters are consistent with Grade II diastolic dysfunction  (pseudonormalization).   2. Right ventricular systolic function is normal. The right ventricular  size is normal. There is normal pulmonary artery systolic pressure. The  estimated right ventricular systolic pressure is 03.4 mmHg.   3. Left atrial size was mildly dilated.   4. Right atrial size was mildly dilated.   5. The mitral valve is normal in structure. No evidence of mitral valve  regurgitation. No evidence of mitral stenosis.   6. The aortic valve is tricuspid. There is moderate calcification of the  aortic valve.  Aortic valve regurgitation is not visualized. Mild aortic  valve stenosis. Aortic valve mean gradient measures 12.0 mmHg.   7. The inferior vena cava is normal in size with greater than 50%  respiratory variability, suggesting right atrial pressure of 3 mmHg.     Echo from 09/18/21  at [place]   Name: ROMELLO, HOEHN Study Date: 09/18/2021 08:31 AM Height: 70 in MRN: [id number] Patient Location: [facility]/MED/CP/CARDIO/ECHO/01 Weight: 184 lb DOB: 26-Jun-1945 Gender: Male BSA: 2.0 m2 Age: 76 yrs BP: 170/88 mmHg Reason For Study: Murmur   CP Order Number: [id number] Referring Physician: [name]  Interpretation Summary The left ventricle is normal in size. There is mild concentric left ventricular hypertrophy. EF= 57.3% by 2D biplane method. The right ventricle is normal in size and function. The left atrial size is normal. The aortic valve is trileaflet. Moderate thickening of the right and non-coronary cusps. No hemodynamically significant valvular aortic stenosis. There is mild mitral annular calcification. There is trace to mild mitral regurgitation. There is mild tricuspid regurgitation. Right ventricular systolic pressure is normal.  Left Ventricle: The left ventricle is normal in size. There is mild concentric left ventricular hypertrophy. EF= 57.3% by 2D biplane method.  Right Ventricle: The right ventricle is normal in size and function.  Left Atrium The left atrial size is normal.  Right Atrium: Right atrial size is normal.  Aortic Valve: The aortic valve is trileaflet. Moderate thickening of the right and non-coronary cusps. No hemodynamically significant valvular aortic stenosis.  Pulmonic Valve: The pulmonic valve is grossly normal as visualized. Trace to mild pulmonic valvular regurgitation.  Mitral Valve: There is mild mitral annular calcification. There is trace to mild mitral regurgitation.  Tricuspid Valve: The tricuspid valve is grossly normal.  There is mild tricuspid regurgitation. Right ventricular systolic pressure is normal.  Venous: The IVC is normal in size with an inspiratory collapse of greater then 50%, suggesting normal right atrial pressure.  Great Vessels: The aortic root is normal size.  Pericardium/Pleural: There is no pericardial effusion.     Nuclear stress test 12/17/21  done at the [facility] [id number] DOB-Jan 26, 1946 M Exm Date: Dec 25, 2021'@08'$ :00 Req Phys: Delma Post Loc: [facility]/MED/CARDIO/NUC/STRESS ([place] MEDICINE Service: Unknown    (Case [phone] COMPLETE) MYOCARDIAL PERFUSION(SPECT)EX.RED(NM Detailed) [id number] Reason for Study: Hypertensive Heart Disease without Heart  (Case [phone] COMPLETE) TC99M TETROFOSMIN (  NM Detailed) [id number]  (Case [phone] COMPLETE) TC99M TETROFOSMIN-2 (NM Detailed) [id number]  Clinical History:  Report Status: Verified Date Reported: Dec 25, 2021 Date Verified: Dec 25, 2021 Verifier E-Sig:/ES/[email or web address] A MOYERS, MD  Report: History: Hypertensive Heart Disease without Heart  Comparison: None  Findings: Myocardial perfusion study was performed. Images were interpreted on the cardiac workstation.  Pharmacologic stress test was performed under the supervision of the cardiologist. Please see cardiologist notes for details.  Rest images were obtained utilizing 1.6 mCi of technetium 76mMyoview IV. Stress images were obtained utilizing 32.8 mCi of technetium 951myoview IV.  No fixed defects or areas of reversible ischemia are seen.  TID is 1.06.  Ejection fraction is calculated at 63% during stress.  Calculated end diastolic volume is 11616L.  --------------------------------------------------------    Impression:   No evidence of reversible ischemia.  Electronically Signed By: JuKathryne Erikssonlectronically Signed On: 12/25/2021 4:13 PM    Patient Profile     89.o. male  with a hx of PVD,  s/p LSFA stent 2012, R CEA 2006,  and HTN. Intolerance to statins, hx of endovascular aneurysm repair abd in 2020, chronic mesenteric ischemia with stent to SMA-2021 who is being seen 05/15/2022 for the evaluation of abnormal EKG with elevated troponin at the request of Dr. GhSloan Leiter  Assessment & Plan    #Acute hypoxemic Respiratory failure -related to multifocal PNA -chest CT showed bilateral pleural effusions with elevated BNP but suspect most of this is related to his PNA -2D echo with normal LVF with diastolic dysfunction -continue antibx per [facility]   #Elevated Troponin -elevated (257>>386>>348) but with flat trend and not c/w ACS  -in setting of normal LVF (echo this admit) this is likely related to demand ischemia from acute hypoxemic respiratory failure and PNA -[facility] with no ischemia and normal LVF -he has not had any angina>>no further inpt ischemic workup and [facility]   #HTN -BP remains elevated -home meds include Hydralazine '50mg'$  TID, Lisinopril HCT 10-12.'5mg'$  daily -Stopped Lisinopril HCT and started Irbesartan '150mg'$  daily  -no diuretic at this time due to AKI -SCr has bumped to 1.86 today -Stop Irbesartan -increase Hydralazine to '100mg'$  TID due to persistently elevated BP -continue Bisoprolol '10mg'$  daily -continue to increase BB as needed for BP control  #PAD -s/p LSFA stent 2012, RCEA 2006, endovascular AAA repair 2020, stent to SMA for chronic mesenteric ischemia 2021 -he is statin intolerant -LDL this admit good at 53  # NEW ONSET ATRIAL FIBRILLATION -had an episode of PAF this admit but now in NSR with no reoccurence -CHADS2VASC score is 4 -this occurred in the setting of PNA but he was unaware of his afib at the time so he could be having silent PAF at home -I think the best course of action at this time is to transition to Eliquis '5mg'$  BID and then can consider outpatient event monitor to assess for any further  PAF -continue Bisoprolol '10mg'$  daily -will refer to afib clinic a discharge  #NSVT -had an 18 beat run of NSVT>>asymptomatic -2D echo with EF 60% and nuclear stress test 12/2021 with no ischemia -K+ 3.8 -check Mag -replete K+ to 4 -continue Bisoprolol '10mg'$  daily     For questions or updates, please contact [place] consult [email or web address] for contact info under        Signed, TrFransico HimMD  05/18/2022, 9:32 AM

## 2022-05-18 NOTE — TOC Progression Note (Signed)
Transition of Care [facility]) - Progression Note    Patient Details  Name: Samuel [name] MRN: [id number] Date of Birth: 1945/09/11  [facility] CM/SW Contact  Leeroy Cha, RN Phone Number: 05/18/2022, 9:14 AM  Clinical Narrative:    Request for home o2 sent to [employer] at 0914.        Expected Discharge Plan and Services                                                 Social Determinants of Health (SDOH) Interventions    Readmission Risk Interventions   No data to display

## 2022-05-18 NOTE — Progress Notes (Signed)
Mobility Specialist Cancellation/Refusal Note:  Pt declined mobility on two occurrences today due to not feeling well & high BP. Will check back as schedule permits.     [facility]

## 2022-05-19 ENCOUNTER — Encounter (HOSPITAL_COMMUNITY): Payer: Medicare Other

## 2022-05-19 DIAGNOSIS — I10 Essential (primary) hypertension: Secondary | ICD-10-CM | POA: Diagnosis not present

## 2022-05-19 DIAGNOSIS — R7989 Other specified abnormal findings of blood chemistry: Secondary | ICD-10-CM | POA: Diagnosis not present

## 2022-05-19 DIAGNOSIS — I480 Paroxysmal atrial fibrillation: Secondary | ICD-10-CM | POA: Diagnosis not present

## 2022-05-19 DIAGNOSIS — J9601 Acute respiratory failure with hypoxia: Secondary | ICD-10-CM | POA: Diagnosis not present

## 2022-05-19 LAB — BASIC METABOLIC PANEL
Anion gap: 9 (ref 5–15)
BUN: 31 mg/dL — ABNORMAL HIGH (ref 8–23)
CO2: 21 mmol/L — ABNORMAL LOW (ref 22–32)
Calcium: 8.9 mg/dL (ref 8.9–10.3)
Chloride: 107 mmol/L (ref 98–111)
Creatinine, Ser: 1.4 mg/dL — ABNORMAL HIGH (ref 0.61–1.24)
GFR, Estimated: 51 mL/min — ABNORMAL LOW (ref >60)
Glucose, Bld: 106 mg/dL — ABNORMAL HIGH (ref 70–99)
Potassium: 3.8 mmol/L (ref 3.5–5.1)
Sodium: 137 mmol/L (ref 135–145)

## 2022-05-19 LAB — CULTURE, BLOOD (ROUTINE X 2)
Culture: NO GROWTH
Special Requests: ADEQUATE

## 2022-05-19 LAB — CBC
HCT: 28.9 % — ABNORMAL LOW (ref 39.0–52.0)
Hemoglobin: 9.6 g/dL — ABNORMAL LOW (ref 13.0–17.0)
MCH: 33.4 pg (ref 26.0–34.0)
MCHC: 33.2 g/dL (ref 30.0–36.0)
MCV: 100.7 fL — ABNORMAL HIGH (ref 80.0–100.0)
Platelets: 210 10*3/uL (ref 150–400)
RBC: 2.87 MIL/uL — ABNORMAL LOW (ref 4.22–5.81)
RDW: 12.8 % (ref 11.5–15.5)
WBC: 7.9 10*3/uL (ref 4.0–10.5)
nRBC: 0 % (ref 0.0–0.2)

## 2022-05-19 MED ORDER — AMLODIPINE BESYLATE 5 MG PO TABS
5.0000 mg | ORAL_TABLET | Freq: Every day | ORAL | Status: DC
  Administered 2022-05-19: 5 mg via ORAL
  Filled 2022-05-19 (×2): qty 1

## 2022-05-19 MED ORDER — MELATONIN 3 MG PO TABS
3.0000 mg | ORAL_TABLET | Freq: Every day | ORAL | Status: DC
  Administered 2022-05-19: 3 mg via ORAL
  Filled 2022-05-19: qty 1

## 2022-05-19 NOTE — Progress Notes (Signed)
PROGRESS NOTE    Samuel [name]  NWG:[id number] DOB: 1945-08-26 DOA: 05/14/2022 PCP: [name], MD    Brief Narrative:  76 year old with history of AAA, peripheral arterial disease, essential hypertension, hyperlipidemia presented from urgent care with progressive worsening of shortness of breath, productive cough with blood-tinge, fatigue and malaise.  At urgent care, oxygen saturation 70% on room air.  Initially afebrile.  Heart rate 95, respiration 20.  Blood pressure is 212/97.  WBC count 12.6.  D-dimer 0.8.  Chest x-ray with bilateral lower lobe predominant airspace disease.  CTA with no PE, patchy bilateral airspace opacification likely multifocal pneumonia.  Admitted with IV antibiotics.  Initially on BiPAP and on high flow oxygen.  COVID and influenza negative. 12/7, overnight with new onset rapid A-fib patient fairly stable.  Started on heparin infusion. Clinically improving, still on oxygen and cardiac arrhythmias. Remains in the hospital with elevated blood pressures, oxygen need and medication titration.   Assessment & Plan:   Acute hypoxemic respiratory failure, respiratory distress secondary to multifocal pneumonia: Severe sepsis present on admission secondary to pneumonia.  Hypoxemia, leukocytosis, tachypnea.  Currently remains on ceftriaxone and azithromycin.  Clinically improving.  Now on 4 L oxygen with mobility. Continue Chest physiotherapy, incentive spirometry, deep breathing exercises, sputum induction, mucolytic's and bronchodilators. Sputum cultures, blood cultures, Legionella and streptococcal antigen.  Negative so far. Supplemental oxygen to keep saturations more than 90%.  Echocardiogram with normal ejection fraction. Patient will be going home with oxygen supplementation and this has been arranged at home.  New onset atrial fibrillation: 12/7 overnight went into rapid A-fib.  Patient was asymptomatic.  Spontaneously converted to sinus rhythm with 5 mg of IV  metoprolol.   Currently on bisoprolol.  On Eliquis.  Rate controlled in sinus rhythm now. Intermittent NSVT noted, electrolyte replacement.  Avoid hypoxemia.  Abnormal EKG, elevated troponins: Troponins are flat.  demand ischemia.  Echocardiogram with no new findings. Patient had Lexiscan about a month ago and reportedly no reversible ischemia.  Acute kidney injury: Probably hemodynamic mediated.  Patient also received angiogram with intravenous dye. Stopping ARB and starting on increased dose of hydralazine. Kidney functions improved today.  Checking renal artery duplexes today.  Chronic medical issues including Hypertension, medication changed to hydralazine , bisoprolol .  Lisinopril was changed to irbesartan.  Holding irbesartan due to AKI.  On increasing dose of hydralazine. Hyperlipidemia, on a statin. Peripheral arterial disease, status post stents to SMA and endovascular aneurysm repair.  Currently on Plavix. Stable.  Patient is medically stabilizing.  Is still intermittent elevated blood pressures.  Renal duplex is today.  Continue mobility.  Arrange for home oxygen.  Anticipate home later today or tomorrow if his blood pressures are better.   DVT prophylaxis: SCDs Start: 05/14/22 1540 apixaban (ELIQUIS) tablet 5 mg   Code Status: Full code Family Communication: None at the bedside. Disposition Plan: Status is: Inpatient Remains inpatient appropriate because: On significant oxygen requirement, IV antibiotics.     Consultants:  Cardiology  Procedures:  None  Antimicrobials:  Rocephin and azithromycin 12/5---   Subjective:  Seen and examined.  Yesterday evening he did not feel well with fluctuating blood pressures.  He felt tired.  Denies any chest pain.  Dry cough present.  Afebrile.  Wearing 4 L of oxygen and planning not to take it off.  Objective: Vitals:   05/18/22 1910 05/18/22 2023 05/18/22 2255 05/19/22 0607  BP: (!) 189/74 (!) 170/65 (!) 162/67 (!)  171/71  Pulse: 75 76  73  Resp: (!) '22 19 20 18  '$ Temp:  98.3 F (36.8 C)  97.9 F (36.6 C)  TempSrc:  Oral  Oral  SpO2:  (!) 81%  (!) 86%  Weight:      Height:        Intake/Output Summary (Last 24 hours) at 05/19/2022 1051 Last data filed at 05/19/2022 0900 Gross per 24 hour  Intake 240 ml  Output --  Net 240 ml    Filed Weights   05/14/22 1203 05/14/22 2018  Weight: 79.4 kg 81 kg    Examination:  General exam: Appears calm and comfortable, able to talk in complete sentences.  Anxious today. Respiratory system: Clear to auscultation. Respiratory effort normal.  No added sounds. SpO2: (!) 86 % O2 Flow Rate (L/min): 3 L/min FiO2 (%): 60 %  Cardiovascular system: S1 & S2 heard, RRR. No pedal edema. Gastrointestinal system: Abdomen is nondistended, soft and nontender. No organomegaly or masses felt. Normal bowel sounds heard. Central nervous system: Alert and oriented. No focal neurological deficits. Extremities: Symmetric 5 x 5 power. Skin: No rashes, lesions or ulcers Psychiatry: Judgement and insight appear normal. Mood & affect appropriate.     Data Reviewed: I have personally reviewed following labs and imaging studies  CBC: Recent Labs  Lab 05/14/22 1210 05/15/22 0319 05/16/22 0320 05/17/22 0414 05/18/22 0419 05/19/22 0349  WBC 12.6* 13.6* 11.4* 8.2 7.4 7.9  NEUTROABS 10.2*  --  8.8*  --   --   --   HGB 11.6* 10.3* 9.9* 8.7* 8.7* 9.6*  HCT 34.6* 31.1* 29.4* 26.0* 26.3* 28.9*  MCV 98.9 100.0 99.7 99.6 100.0 100.7*  PLT 197 190 196 171 175 916    Basic Metabolic Panel: Recent Labs  Lab 05/14/22 1210 05/15/22 0319 05/16/22 0320 05/18/22 0419 05/19/22 0349  NA 137 134* 135 139 137  K 4.0 4.0 3.5 3.8 3.8  CL 104 101 102 108 107  CO2 24 22 21* 21* 21*  GLUCOSE 117* 123* 119* 123* 106*  BUN 21 21 28* 36* 31*  CREATININE 1.29* 1.56* 1.62* 1.86* 1.40*  CALCIUM 9.3 8.5* 8.6* 8.7* 8.9  MG  --   --  1.9 1.9  --   PHOS  --   --  3.6  --   --      GFR: Estimated Creatinine Clearance: 44.9 mL/min (A) (by C-G formula based on SCr of 1.4 mg/dL (H)). Liver Function Tests: Recent Labs  Lab 05/15/22 0319 05/16/22 0320  AST 17 19  ALT 14 15  ALKPHOS 35* 39  BILITOT 0.8 0.8  PROT 6.2* 6.6  ALBUMIN 3.2* 3.3*    No results for input(s): "LIPASE", "AMYLASE" in the last 168 hours. No results for input(s): "AMMONIA" in the last 168 hours. Coagulation Profile: No results for input(s): "INR", "PROTIME" in the last 168 hours. Cardiac Enzymes: No results for input(s): "CKTOTAL", "CKMB", "CKMBINDEX", "TROPONINI" in the last 168 hours. BNP (last 3 results) No results for input(s): "PROBNP" in the last 8760 hours. HbA1C: No results for input(s): "HGBA1C" in the last 72 hours. CBG: Recent Labs  Lab 05/16/22 1143  GLUCAP 103*    Lipid Profile: Recent Labs    05/17/22 0414  CHOL 99  HDL 34*  LDLCALC 53  TRIG 61  CHOLHDL 2.9    Thyroid Function Tests: No results for input(s): "TSH", "T4TOTAL", "FREET4", "T3FREE", "THYROIDAB" in the last 72 hours. Anemia Panel: No results for input(s): "VITAMINB12", "FOLATE", "FERRITIN", "TIBC", "IRON", "RETICCTPCT" in the last 72  hours. Sepsis Labs: Recent Labs  Lab 05/14/22 1752 05/14/22 2115  LATICACIDVEN 1.2 1.4     Recent Results (from the past 240 hour(s))  Resp Panel by RT-PCR (Flu A&B, Covid) Anterior Nasal Swab     Status: None   Collection Time: 05/14/22 12:11 PM   Specimen: Anterior Nasal Swab  Result Value Ref Range Status   SARS Coronavirus 2 by RT PCR NEGATIVE NEGATIVE Final    Comment: (NOTE) SARS-CoV-2 target nucleic acids are NOT DETECTED.  The SARS-CoV-2 RNA is generally detectable in upper respiratory specimens during the acute phase of infection. The lowest concentration of SARS-CoV-2 viral copies this assay can detect is 138 copies/mL. A negative result does not preclude SARS-Cov-2 infection and should not be used as the sole basis for treatment or other  patient management decisions. A negative result may occur with  improper specimen collection/handling, submission of specimen other than nasopharyngeal swab, presence of viral mutation(s) within the areas targeted by this assay, and inadequate number of viral copies(<138 copies/mL). A negative result must be combined with clinical observations, patient history, and epidemiological information. The expected result is Negative.  Fact Sheet for Patients:  EntrepreneurPulse.com.au  Fact Sheet for Healthcare Providers:  IncredibleEmployment.be  This test is no t yet approved or cleared by the [place] FDA and  has been authorized for detection and/or diagnosis of SARS-CoV-2 by FDA under an Emergency Use Authorization (EUA). This EUA will remain  in effect (meaning this test can be used) for the duration of the COVID-19 declaration under Section 564(b)(1) of the Act, 21 U.S.C.section 360bbb-3(b)(1), unless the authorization is terminated  or revoked sooner.       Influenza A by PCR NEGATIVE NEGATIVE Final   Influenza B by PCR NEGATIVE NEGATIVE Final    Comment: (NOTE) The Xpert Xpress SARS-CoV-2/FLU/RSV plus assay is intended as an aid in the diagnosis of influenza from Nasopharyngeal swab specimens and should not be used as a sole basis for treatment. Nasal washings and aspirates are unacceptable for Xpert Xpress SARS-CoV-2/FLU/RSV testing.  Fact Sheet for Patients: EntrepreneurPulse.com.au  Fact Sheet for Healthcare Providers: IncredibleEmployment.be  This test is not yet approved or cleared by the [place] FDA and has been authorized for detection and/or diagnosis of SARS-CoV-2 by FDA under an Emergency Use Authorization (EUA). This EUA will remain in effect (meaning this test can be used) for the duration of the COVID-19 declaration under Section 564(b)(1) of the Act, 21 U.S.C. section  360bbb-3(b)(1), unless the authorization is terminated or revoked.  Performed at [facility], [place] [address], [place], [place] [address]   Culture, blood (routine x 2)     Status: None (Preliminary result)   Collection Time: 05/14/22  5:52 PM   Specimen: BLOOD  Result Value Ref Range Status   Specimen Description   Final    BLOOD RIGHT ANTECUBITAL Performed at [place] [address], [place], [place] [address]    Special Requests   Final    BOTTLES DRAWN AEROBIC AND ANAEROBIC Blood Culture adequate volume Performed at [place] [address] [place], [place] [address]    Culture   Final    NO GROWTH 4 DAYS Performed at [facility], [place] [address], [place],  [address]    Report Status PENDING  Incomplete  MRSA Next Gen by PCR, Nasal     Status: None   Collection Time: 05/14/22  8:33 PM   Specimen: Nasal Mucosa;  Nasal Swab  Result Value Ref Range Status   MRSA by PCR Next Gen NOT DETECTED NOT DETECTED Final    Comment: (NOTE) The GeneXpert MRSA Assay (FDA approved for NASAL specimens only), is one component of a comprehensive MRSA colonization surveillance program. It is not intended to diagnose MRSA infection nor to guide or monitor treatment for MRSA infections. Test performance is not FDA approved in patients less than 56 years old. Performed at [facility], [place] [address] [place], [place] [address]   Culture, blood (routine x 2)     Status: None (Preliminary result)   Collection Time: 05/14/22  9:15 PM   Specimen: BLOOD  Result Value Ref Range Status   Specimen Description   Final    BLOOD BLOOD RIGHT HAND Performed at [place] [address], [place], [place] [address]    Special Requests   Final    BOTTLES DRAWN AEROBIC AND ANAEROBIC Blood Culture adequate volume Performed at [place] [address],  [place], [place] [address]    Culture   Final    NO GROWTH 3 DAYS Performed at [facility], [place] [address], [place],  [address]    Report Status PENDING  Incomplete         Radiology Studies: No results found.      Scheduled Meds:  amLODipine  5 mg Oral Daily   apixaban  5 mg Oral BID   bisoprolol  10 mg Oral Daily   Chlorhexidine Gluconate Cloth  6 each Topical Daily   clopidogrel  75 mg Oral q AM   hydrALAZINE  100 mg Oral TID   mouth rinse  15 mL Mouth Rinse 4 times per day   tamsulosin  0.4 mg Oral Daily   Continuous Infusions:  azithromycin 500 mg (05/18/22 1535)   cefTRIAXone (ROCEPHIN)  IV 2 g (05/18/22 1417)     LOS: 5 days    Time spent: 35 minutes    Barb Merino, MD [facility] Pager [phone]

## 2022-05-19 NOTE — Progress Notes (Signed)
Progress Note  Patient Name: Samuel [name] Date of Encounter: 05/19/2022  [facility] Cardiologist: None   Subjective   Nuclear stress test from [place] 12/2021 that showed no ischemia. Denies any CP or SOB.  Anxious to go home. No further NSVT on tele.   Inpatient Medications    Scheduled Meds:  apixaban  5 mg Oral BID   bisoprolol  10 mg Oral Daily   Chlorhexidine Gluconate Cloth  6 each Topical Daily   clopidogrel  75 mg Oral q AM   hydrALAZINE  100 mg Oral TID   mouth rinse  15 mL Mouth Rinse 4 times per day   tamsulosin  0.4 mg Oral Daily   Continuous Infusions:  azithromycin 500 mg (05/18/22 1535)   cefTRIAXone (ROCEPHIN)  IV 2 g (05/18/22 1417)   PRN Meds: acetaminophen **OR** acetaminophen, ipratropium-albuterol, ondansetron **OR** ondansetron (ZOFRAN) IV, mouth rinse   Vital Signs    Vitals:   05/18/22 1910 05/18/22 2023 05/18/22 2255 05/19/22 0607  BP: (!) 189/74 (!) 170/65 (!) 162/67 (!) 171/71  Pulse: 75 76  73  Resp: (!) '22 19 20 18  '$ Temp:  98.3 F (36.8 C)  97.9 F (36.6 C)  TempSrc:  Oral  Oral  SpO2:  (!) 81%  (!) 86%  Weight:      Height:        Intake/Output Summary (Last 24 hours) at 05/19/2022 0910 Last data filed at 05/18/2022 1900 Gross per 24 hour  Intake 120 ml  Output --  Net 120 ml       05/14/2022    8:18 PM 05/14/2022   12:03 PM 11/01/2021    1:01 PM  Last 3 Weights  Weight (lbs) 178 lb 9.2 oz 175 lb 0.7 oz 175 lb  Weight (kg) 81 kg 79.4 kg 79.379 kg      Telemetry    NSR- Personally Reviewed  ECG    No new EKG to review - Personally Reviewed  Physical Exam   GEN: Well nourished, well developed in no acute distress HEENT: Normal NECK: No JVD; No carotid bruits LYMPHATICS: No lymphadenopathy CARDIAC:RRR, no murmurs, rubs, gallops RESPIRATORY:  Clear to auscultation without rales, wheezing or rhonchi  ABDOMEN: Soft, non-tender, non-distended MUSCULOSKELETAL:  No edema; No deformity  SKIN: Warm and  dry NEUROLOGIC:  Alert and oriented x 3 PSYCHIATRIC:  Normal affect  Labs    High Sensitivity Troponin:   Recent Labs  Lab 05/14/22 1752 05/14/22 2115 05/15/22 0722  TROPONINIHS 257* 386* 348*       Chemistry Recent Labs  Lab 05/15/22 0319 05/16/22 0320 05/18/22 0419 05/19/22 0349  NA 134* 135 139 137  K 4.0 3.5 3.8 3.8  CL 101 102 108 107  CO2 22 21* 21* 21*  GLUCOSE 123* 119* 123* 106*  BUN 21 28* 36* 31*  CREATININE 1.56* 1.62* 1.86* 1.40*  CALCIUM 8.5* 8.6* 8.7* 8.9  PROT 6.2* 6.6  --   --   ALBUMIN 3.2* 3.3*  --   --   AST 17 19  --   --   ALT 14 15  --   --   ALKPHOS 35* 39  --   --   BILITOT 0.8 0.8  --   --   GFRNONAA 45* 43* 37* 51*  ANIONGAP '11 12 10 9      '$ Hematology Recent Labs  Lab 05/17/22 0414 05/18/22 0419 05/19/22 0349  WBC 8.2 7.4 7.9  RBC 2.61* 2.63* 2.87*  HGB 8.7* 8.7* 9.6*  HCT 26.0* 26.3* 28.9*  MCV 99.6 100.0 100.7*  MCH 33.3 33.1 33.4  MCHC 33.5 33.1 33.2  RDW 12.9 12.9 12.8  PLT 171 175 210     BNP Recent Labs  Lab 05/14/22 1210  BNP 951.2*      DDimer  Recent Labs  Lab 05/14/22 1210  DDIMER 0.84*      Radiology    No results found.  Cardiac Studies  2D echo 05/15/2022 IMPRESSIONS    1. Left ventricular ejection fraction, by estimation, is 60 to 65%. The  left ventricle has normal function. The left ventricle has no regional  wall motion abnormalities. There is moderate concentric left ventricular  hypertrophy. Left ventricular  diastolic parameters are consistent with Grade II diastolic dysfunction  (pseudonormalization).   2. Right ventricular systolic function is normal. The right ventricular  size is normal. There is normal pulmonary artery systolic pressure. The  estimated right ventricular systolic pressure is 29.5 mmHg.   3. Left atrial size was mildly dilated.   4. Right atrial size was mildly dilated.   5. The mitral valve is normal in structure. No evidence of mitral valve   regurgitation. No evidence of mitral stenosis.   6. The aortic valve is tricuspid. There is moderate calcification of the  aortic valve. Aortic valve regurgitation is not visualized. Mild aortic  valve stenosis. Aortic valve mean gradient measures 12.0 mmHg.   7. The inferior vena cava is normal in size with greater than 50%  respiratory variability, suggesting right atrial pressure of 3 mmHg.     Echo from 09/18/21  at [place]   Name: Samuel [name], Samuel [name] Study Date: 09/18/2021 08:31 AM Height: 70 in MRN: [id number] Patient Location: [facility]/MED/CP/CARDIO/ECHO/01 Weight: 184 lb DOB: [birth year] Gender: Male BSA: 2.0 m2 Age: 76 yrs BP: 170/88 mmHg Reason For Study: Murmur   CP Order Number: [id number] Referring Physician: [name]  Interpretation Summary The left ventricle is normal in size. There is mild concentric left ventricular hypertrophy. EF= 57.3% by 2D biplane method. The right ventricle is normal in size and function. The left atrial size is normal. The aortic valve is trileaflet. Moderate thickening of the right and non-coronary cusps. No hemodynamically significant valvular aortic stenosis. There is mild mitral annular calcification. There is trace to mild mitral regurgitation. There is mild tricuspid regurgitation. Right ventricular systolic pressure is normal.  Left Ventricle: The left ventricle is normal in size. There is mild concentric left ventricular hypertrophy. EF= 57.3% by 2D biplane method.  Right Ventricle: The right ventricle is normal in size and function.  Left Atrium The left atrial size is normal.  Right Atrium: Right atrial size is normal.  Aortic Valve: The aortic valve is trileaflet. Moderate thickening of the right and non-coronary cusps. No hemodynamically significant valvular aortic stenosis.  Pulmonic Valve: The pulmonic valve is grossly normal as visualized. Trace to mild pulmonic valvular regurgitation.  Mitral Valve: There is  mild mitral annular calcification. There is trace to mild mitral regurgitation.  Tricuspid Valve: The tricuspid valve is grossly normal. There is mild tricuspid regurgitation. Right ventricular systolic pressure is normal.  Venous: The IVC is normal in size with an inspiratory collapse of greater then 50%, suggesting normal right atrial pressure.  Great Vessels: The aortic root is normal size.  Pericardium/Pleural: There is no pericardial effusion.     Nuclear stress test 12/17/21  done at [facility] [phone] DOB-Feb 05, 1946 M Exm Date: Dec 25, 2021'@08'$ :00 Req Phys: [place] Post Loc: [facility]/MED/CARDIO/NUC/STRESS ([place] MEDICINE Service: Unknown    (Case [phone] COMPLETE) MYOCARDIAL PERFUSION(SPECT)EX.RED(NM Detailed) [id number] Reason for Study: Hypertensive Heart Disease without Heart  (Case [phone] COMPLETE) TC99M TETROFOSMIN (NM Detailed) [id number]  (Case [id number] COMPLETE) TC99M TETROFOSMIN-2 (NM Detailed) [id number]  Clinical History:  Report Status: Verified Date Reported: Dec 25, 2021 Date Verified: Dec 25, 2021 Verifier E-Sig:/[email or web address] A MOYERS, MD  Report: History: Hypertensive Heart Disease without Heart  Comparison: None  Findings: Myocardial perfusion study was performed. Images were interpreted on the cardiac workstation.  Pharmacologic stress test was performed under the supervision of the cardiologist. Please see cardiologist notes for details.  Rest images were obtained utilizing 1.6 mCi of technetium 27mMyoview IV. Stress images were obtained utilizing 32.8 mCi of technetium 943myoview IV.  No fixed defects or areas of reversible ischemia are seen.  TID is 1.06.  Ejection fraction is calculated at 63% during stress.  Calculated end diastolic volume is 11322L.  --------------------------------------------------------    Impression:   No evidence of reversible  ischemia.  Electronically Signed By: JuKathryne Erikssonlectronically Signed On: 12/25/2021 4:13 PM    Patient Profile     [age].o. male  with a hx of PVD, s/p LSFA stent 2012, R CEA 2006,  and HTN. Intolerance to statins, hx of endovascular aneurysm repair abd in 2020, chronic mesenteric ischemia with stent to SMA-2021 who is being seen 05/15/2022 for the evaluation of abnormal EKG with elevated troponin at the request of Dr. GhSloan Leiter  Assessment & Plan    #Acute hypoxemic Respiratory failure -related to multifocal PNA -chest CT showed bilateral pleural effusions with elevated BNP but suspect most of this is related to his PNA -2D echo with normal LVF with diastolic dysfunction -continue antibx per [facility]   #Elevated Troponin -elevated (257>>386>>348) but with flat trend and not c/w ACS  -in setting of normal LVF (echo this admit) this is likely related to demand ischemia from acute hypoxemic respiratory failure and PNA -[facility] with no ischemia and normal LVF -he has not had any angina>>no further inpt ischemic workup and [facility]   #HTN -BP still elevated -home meds include Hydralazine '50mg'$  TID, Lisinopril HCT 10-12.'5mg'$  daily -Stopped Lisinopril HCT and started Irbesartan '150mg'$  daily  -no diuretic at this time due to AKI -SCr improved from 1.86>>1.4 this am -ARB stopped due to AKI -continue Hydralazine to '100mg'$  TID and Bisoprolol '10mg'$  daily -Add amlodipine '5mg'$  daily -with bump in SCr with ARB and persistently elevated BP, will check renal duplex to rule out RAS prior to discharge home today  #PAD -s/p LSFA stent 2012, RCEA 2006, endovascular AAA repair 2020, stent to SMA for chronic mesenteric ischemia 2021 -he is statin intolerant -LDL this admit good at 53  # NEW ONSET ATRIAL FIBRILLATION -had an episode of PAF this admit but now in NSR with no reoccurence -CHADS2VASC score is 4 -this occurred in the setting of PNA but he was unaware  of his afib at the time so he could be having silent PAF at home -I think the best course of action at this time is to transition to Eliquis '5mg'$  BID and then can consider outpatient event monitor to assess for any further PAF -continue Bisoprolol '10mg'$  daily -will refer to afib clinic a discharge  #NSVT -had an 18 beat run of NSVT yesterday but no further runs on tele>>asymptomatic -2D echo with EF 60%  and nuclear stress test 12/2021 with no ischemia -K+ 3.8, Mag 1.9 -continue Bisoprolol '10mg'$  daily     For questions or updates, please contact [place] Please consult [email or web address] for contact info under        Signed, Fransico Him, MD  05/19/2022, 9:10 AM

## 2022-05-19 NOTE — Plan of Care (Signed)
  Problem: Clinical Measurements: Goal: Diagnostic test results will improve Outcome: Progressing   Problem: Activity: Goal: Risk for activity intolerance will decrease Outcome: Progressing   Problem: Safety: Goal: Ability to remain free from injury will improve Outcome: Progressing   

## 2022-05-20 ENCOUNTER — Inpatient Hospital Stay (HOSPITAL_COMMUNITY): Payer: Medicare Other

## 2022-05-20 DIAGNOSIS — I4891 Unspecified atrial fibrillation: Secondary | ICD-10-CM | POA: Diagnosis not present

## 2022-05-20 DIAGNOSIS — J189 Pneumonia, unspecified organism: Secondary | ICD-10-CM

## 2022-05-20 DIAGNOSIS — J9601 Acute respiratory failure with hypoxia: Secondary | ICD-10-CM | POA: Diagnosis not present

## 2022-05-20 DIAGNOSIS — I160 Hypertensive urgency: Secondary | ICD-10-CM | POA: Diagnosis not present

## 2022-05-20 DIAGNOSIS — I10 Essential (primary) hypertension: Secondary | ICD-10-CM | POA: Diagnosis not present

## 2022-05-20 LAB — CBC
HCT: 27.3 % — ABNORMAL LOW (ref 39.0–52.0)
Hemoglobin: 9.1 g/dL — ABNORMAL LOW (ref 13.0–17.0)
MCH: 32.7 pg (ref 26.0–34.0)
MCHC: 33.3 g/dL (ref 30.0–36.0)
MCV: 98.2 fL (ref 80.0–100.0)
Platelets: 239 10*3/uL (ref 150–400)
RBC: 2.78 MIL/uL — ABNORMAL LOW (ref 4.22–5.81)
RDW: 12.7 % (ref 11.5–15.5)
WBC: 10 10*3/uL (ref 4.0–10.5)
nRBC: 0 % (ref 0.0–0.2)

## 2022-05-20 LAB — BASIC METABOLIC PANEL
Anion gap: 10 (ref 5–15)
BUN: 37 mg/dL — ABNORMAL HIGH (ref 8–23)
CO2: 21 mmol/L — ABNORMAL LOW (ref 22–32)
Calcium: 9.2 mg/dL (ref 8.9–10.3)
Chloride: 107 mmol/L (ref 98–111)
Creatinine, Ser: 1.66 mg/dL — ABNORMAL HIGH (ref 0.61–1.24)
GFR, Estimated: 42 mL/min — ABNORMAL LOW (ref >60)
Glucose, Bld: 117 mg/dL — ABNORMAL HIGH (ref 70–99)
Potassium: 4.3 mmol/L (ref 3.5–5.1)
Sodium: 138 mmol/L (ref 135–145)

## 2022-05-20 LAB — CULTURE, BLOOD (ROUTINE X 2)
Culture: NO GROWTH
Special Requests: ADEQUATE

## 2022-05-20 MED ORDER — AMLODIPINE BESYLATE 5 MG PO TABS
5.0000 mg | ORAL_TABLET | ORAL | Status: AC
  Administered 2022-05-20: 5 mg via ORAL
  Filled 2022-05-20: qty 1

## 2022-05-20 MED ORDER — AMLODIPINE BESYLATE 10 MG PO TABS: 10.0000 mg | ORAL_TABLET | Freq: Every day | ORAL | 1 refills | Status: AC

## 2022-05-20 MED ORDER — IPRATROPIUM-ALBUTEROL 0.5-2.5 (3) MG/3ML IN SOLN: 3.0000 mL | Freq: Four times a day (QID) | RESPIRATORY_TRACT | 0 refills | Status: AC | PRN

## 2022-05-20 MED ORDER — AMLODIPINE BESYLATE 10 MG PO TABS: 10.0000 mg | ORAL_TABLET | Freq: Every day | ORAL | Status: DC

## 2022-05-20 NOTE — Progress Notes (Signed)
Progress Note  Patient Name: Samuel [name] Date of Encounter: 05/20/2022  Primary Cardiologist: None   Subjective   Patient was seen and examined at his bedside.  Inpatient Medications    Scheduled Meds:  amLODipine  5 mg Oral Daily   apixaban  5 mg Oral BID   bisoprolol  10 mg Oral Daily   Chlorhexidine Gluconate Cloth  6 each Topical Daily   clopidogrel  75 mg Oral q AM   hydrALAZINE  100 mg Oral TID   melatonin  3 mg Oral QHS   mouth rinse  15 mL Mouth Rinse 4 times per day   tamsulosin  0.4 mg Oral Daily   Continuous Infusions:  PRN Meds: acetaminophen **OR** acetaminophen, ipratropium-albuterol, ondansetron **OR** ondansetron (ZOFRAN) IV, mouth rinse   Vital Signs    Vitals:   05/19/22 1847 05/19/22 1850 05/19/22 2026 05/20/22 0356  BP: (!) 162/63  (!) 164/67 (!) 167/66  Pulse: 78  80 73  Resp:  (!) 21 17   Temp: 97.6 F (36.4 C)  97.8 F (36.6 C) 98.7 F (37.1 C)  TempSrc: Oral  Oral Oral  SpO2:  93% (!) 89% 90%  Weight:      Height:        Intake/Output Summary (Last 24 hours) at 05/20/2022 1101 Last data filed at 05/19/2022 2043 Gross per 24 hour  Intake 540 ml  Output --  Net 540 ml   Filed Weights   05/14/22 1203 05/14/22 2018  Weight: 79.4 kg 81 kg    Telemetry    Sinus rhythm - Personally Reviewed  ECG    None  - Personally Reviewed  Physical Exam    General: Comfortable Head: Atraumatic, normal size  Eyes: PEERLA, EOMI  Neck: Supple, normal JVD Cardiac: Normal S1, S2; RRR; no murmurs, rubs, or gallops Lungs: Clear to auscultation bilaterally Abd: Soft, nontender, no hepatomegaly  Ext: warm, no edema Musculoskeletal: No deformities, BUE and BLE strength normal and equal Skin: Warm and dry, no rashes   Neuro: Alert and oriented to person, place, time, and situation, CNII-XII grossly intact, no focal deficits  Psych: Normal mood and affect   Labs    Chemistry Recent Labs  Lab 05/15/22 0319 05/16/22 0320  05/18/22 0419 05/19/22 0349 05/20/22 0345  NA 134* 135 139 137 138  K 4.0 3.5 3.8 3.8 4.3  CL 101 102 108 107 107  CO2 22 21* 21* 21* 21*  GLUCOSE 123* 119* 123* 106* 117*  BUN 21 28* 36* 31* 37*  CREATININE 1.56* 1.62* 1.86* 1.40* 1.66*  CALCIUM 8.5* 8.6* 8.7* 8.9 9.2  PROT 6.2* 6.6  --   --   --   ALBUMIN 3.2* 3.3*  --   --   --   AST 17 19  --   --   --   ALT 14 15  --   --   --   ALKPHOS 35* 39  --   --   --   BILITOT 0.8 0.8  --   --   --   GFRNONAA 45* 43* 37* 51* 42*  ANIONGAP '11 12 10 9 10     '$ Hematology Recent Labs  Lab 05/18/22 0419 05/19/22 0349 05/20/22 0345  WBC 7.4 7.9 10.0  RBC 2.63* 2.87* 2.78*  HGB 8.7* 9.6* 9.1*  HCT 26.3* 28.9* 27.3*  MCV 100.0 100.7* 98.2  MCH 33.1 33.4 32.7  MCHC 33.1 33.2 33.3  RDW 12.9 12.8 12.7  PLT 175 210  239    Cardiac EnzymesNo results for input(s): "TROPONINI" in the last 168 hours. No results for input(s): "TROPIPOC" in the last 168 hours.   BNP Recent Labs  Lab 05/14/22 1210  BNP 951.2*     DDimer  Recent Labs  Lab 05/14/22 1210  DDIMER 0.84*     Radiology    No results found.  Cardiac Studies   2D echo 05/15/2022 IMPRESSIONS   1. Left ventricular ejection fraction, by estimation, is 60 to 65%. The left ventricle has normal function. The left ventricle has no regional wall motion abnormalities. There is moderate concentric left ventricular  hypertrophy. Left ventricular diastolic parameters are consistent with Grade II diastolic dysfunction  (pseudonormalization).   2. Right ventricular systolic function is normal. The right ventricular size is normal. There is normal pulmonary artery systolic pressure. The estimated right ventricular systolic pressure is 58.8 mmHg.   3. Left atrial size was mildly dilated.   4. Right atrial size was mildly dilated.   5. The mitral valve is normal in structure. No evidence of mitral valve regurgitation. No evidence of mitral stenosis.   6. The aortic valve is  tricuspid. There is moderate calcification of the aortic valve. Aortic valve regurgitation is not visualized. Mild aortic valve stenosis. Aortic valve mean gradient measures 12.0 mmHg.   7. The inferior vena cava is normal in size with greater than 50% respiratory variability, suggesting right atrial pressure of 3 mmHg.       Echo from 09/18/21  at [place] Name: Samuel [name] Study Date: 09/18/2021 08:31 AM Height: 70 in MRN: [id number] Patient Location: [facility]/MED/CP/CARDIO/ECHO/01 Weight: 184 lb DOB: September 04, 1945 Gender: Male BSA: 2.0 m2 Age: 43 yrs BP: 170/88 mmHg Reason For Study: Murmur   CP Order Number: [id number] Referring Physician: [name]  Interpretation Summary The left ventricle is normal in size. There is mild concentric left ventricular hypertrophy. EF= 57.3% by 2D biplane method. The right ventricle is normal in size and function. The left atrial size is normal. The aortic valve is trileaflet. Moderate thickening of the right and non-coronary cusps. No hemodynamically significant valvular aortic stenosis. There is mild mitral annular calcification. There is trace to mild mitral regurgitation. There is mild tricuspid regurgitation. Right ventricular systolic pressure is normal.  Left Ventricle: The left ventricle is normal in size. There is mild concentric left ventricular hypertrophy. EF= 57.3% by 2D biplane method.  Right Ventricle: The right ventricle is normal in size and function.  Left Atrium The left atrial size is normal.  Right Atrium: Right atrial size is normal.  Aortic Valve: The aortic valve is trileaflet. Moderate thickening of the right and non-coronary cusps. No hemodynamically significant valvular aortic stenosis.  Pulmonic Valve: The pulmonic valve is grossly normal as visualized. Trace to mild pulmonic valvular regurgitation.  Mitral Valve: There is mild mitral annular calcification. There is trace to mild mitral  regurgitation.  Tricuspid Valve: The tricuspid valve is grossly normal. There is mild tricuspid regurgitation. Right ventricular systolic pressure is normal.  Venous: The IVC is normal in size with an inspiratory collapse of greater then 50%, suggesting normal right atrial pressure.  Great Vessels: The aortic root is normal size.  Pericardium/Pleural: There is no pericardial effusion.     Nuclear stress test 12/17/21  done at the [facility] [phone] DOB-Jan 26, 1946 M Exm Date: Dec 25, 2021'@08'$ :00 Req Phys: Delma Post Loc: [facility]/MED/CARDIO/NUC/STRESS ([place] [facility] Service: Unknown    (Case [id number] COMPLETE) MYOCARDIAL PERFUSION(SPECT)EX.RED(NM  Detailed) [id number] Reason for Study: Hypertensive Heart Disease without Heart  (Case [id number] COMPLETE) TC99M TETROFOSMIN (NM Detailed) [id number]  (Case [id number] COMPLETE) TC99M TETROFOSMIN-2 (NM Detailed) [id number]  Clinical History:  Report Status: Verified Date Reported: Dec 25, 2021 Date Verified: Dec 25, 2021 Verifier E-Sig:/ES/JULIE A MOYERS, MD  Report: History: Hypertensive Heart Disease without Heart  Comparison: None  Findings: Myocardial perfusion study was performed. Images were interpreted on the cardiac workstation.  Pharmacologic stress test was performed under the supervision of the cardiologist. Please see cardiologist notes for details.  Rest images were obtained utilizing 1.6 mCi of technetium 61mMyoview IV. Stress images were obtained utilizing 32.8 mCi of technetium 944myoview IV.  No fixed defects or areas of reversible ischemia are seen.  TID is 1.06.  Ejection fraction is calculated at 63% during stress.  Calculated end diastolic volume is 11825L.  --------------------------------------------------------    Impression:   No evidence of reversible ischemia.  Electronically Signed By: JuKathryne Erikssonlectronically Signed On:  12/25/2021 4:13 PM    Patient Profile     [age].o. male hx of PVD, s/p LSFA stent 2012, R CEA 2006,  and HTN. Intolerance to statins, hx of endovascular aneurysm repair abd in 2020, chronic mesenteric ischemia with stent to SMA-2021 who is being seen 05/15/2022 for the evaluation of abnormal EKG with elevated troponin.   Assessment & Plan    Acute on chronic respiratory failure Elevated troponin  Hypertension  PAD  New onset atrial fibrillation  NSVT   Blood pressure is still elevated this morning. Recommend increasing Amlopidine to 10 mg daily. Continue other antihypertensive medications Hydralazine 100 mg TID, bisoprolol 10 mg daily. In terms of the elevated troponin no need for ischemic evaluation at this time PAD he is statin intolerant but LDL is at target. Noted to have paroxysmal atrial fibrillation this admission-on rate control agent and also now on Eliquis.    For questions or updates, please contact [facility] consult [email or web address] for contact info under Cardiology/STEMI.      Signed, Lenka Zhao, DO  05/20/2022, 11:01 AM

## 2022-05-20 NOTE — Progress Notes (Signed)
Renal artery duplex has been completed.   Results can be found under chart review under CV PROC. 05/20/2022 1:30 PM Alois Colgan RVT, RDMS

## 2022-05-20 NOTE — Discharge Summary (Signed)
Physician Discharge Summary  SPURGEON GANCARZ OBS:[id number] DOB: 1945/11/07 DOA: 05/14/2022  PCP: [name], MD  Admit date: 05/14/2022 Discharge date: 05/20/2022  Admitted From: home Disposition:  home  Recommendations for Outpatient Follow-up:  Follow up with PCP in 1-2 weeks Please obtain BMP/CBC in one week Follow-up with atrial fibrillation clinic, referral has been made Follow-up with vascular surgery, Dr. Donzetta Matters for mesenteric vascular disease Outpatient referral to pulmonology has been made  Home Health: Home health PT, OT Equipment/Devices: Oxygen at 4 L, nebulizer machine  Discharge Condition: Stable CODE STATUS: Full code Diet recommendation: Heart healthy  Brief/Interim Summary: 76 year old male with a history of AAA, PAD, hypertension, presented with worsening shortness of breath, productive cough and bloody sputum.  Noted to be significantly hypoxic on room air.  Blood pressure significantly elevated at 212/97.  Chest x-ray with bilateral lobe predominant airspace today.  CTA showed patchy bilateral airspace opacification likely multifocal pneumonia.  Initially started on BiPAP and high flow oxygen.  COVID and influenza screen was negative.  Subsequently developed rapid atrial fibrillation and started on anticoagulation.  Seen by cardiology.  Overall respiratory status is stabilizing.  Chest x-ray appears to be clearing.  Oxygen has been weaned down to 4 L.  Overall blood pressures have improved and will be further titrated as an outpatient.  He feels well enough to go home and is eager to be discharged  Discharge Diagnoses:  Principal Problem:   Acute respiratory failure with hypoxia ([place]) Active Problems:   Hyperlipidemia   Essential hypertension   Peripheral vascular disease ([place])   Carotid artery stenosis   Normocytic anemia   Glucose intolerance   Elevated brain natriuretic peptide (BNP) level   Abnormal EKG   Pleural effusion   Multifocal pneumonia    Volume overload   Elevated troponin   Atrial fibrillation with RVR (HCC)   Severe sepsis ([facility])  Acute hypoxemic respiratory failure, respiratory distress secondary to multifocal pneumonia: Severe sepsis present on admission secondary to pneumonia.  Hypoxemia, leukocytosis, tachypnea.   He was treated with ceftriaxone and azithromycin, subsequently transition to oral antibiotics.  Clinically improving.  Now on 4 L oxygen with mobility. Continue Chest physiotherapy, incentive spirometry, deep breathing exercises, sputum induction, mucolytic's and bronchodilators. Sputum cultures, blood cultures, Legionella and streptococcal antigen.  Negative so far. Supplemental oxygen to keep saturations more than 90%.  Echocardiogram with normal ejection fraction. Patient will be going home with oxygen supplementation and this has been arranged at home.   New onset atrial fibrillation: 12/7 overnight went into rapid A-fib.  Patient was asymptomatic.  Spontaneously converted to sinus rhythm with 5 mg of IV metoprolol.   Currently on bisoprolol.  On Eliquis.  Rate controlled in sinus rhythm now. Intermittent NSVT noted, electrolyte replacement.  Avoid hypoxemia.   Abnormal EKG, elevated troponins: Troponins are flat.  demand ischemia.  Echocardiogram with no new findings. Patient had Lexiscan about a month ago and reportedly no reversible ischemia.   Acute kidney injury: Probably hemodynamic mediated.  Patient also received angiogram with intravenous dye. Stopping ARB and starting on increased dose of hydralazine. Although renal function has not normalized yet, overall creatinine appears to be stable and to be followed as an outpatient.  Renal artery duplexes were checked and did not show critical stenosis   Chronic medical issues including Hypertension, medication changed to hydralazine , bisoprolol .  Lisinopril was changed to irbesartan.  Holding irbesartan due to AKI.  On increasing dose of hydralazine.   He is also  on amlodipine Hyperlipidemia, reportedly intolerant of statins Peripheral arterial disease, status post stents to SMA and endovascular aneurysm repair.  Currently on Plavix. Stable. Incidental finding on renal duplex of severe mesenteric stenosis.  He does not have any abdominal pain or vomiting.  He is already followed by Dr. Donzetta Matters with vascular surgery plans on following up .  Discharge Instructions  Discharge Instructions     Ambulatory referral to Pulmonology   Complete by: As directed    Follow up acute respiratory failure, possible pneumonitis   Reason for referral: Other   Ambulatory referral to Vascular Surgery   Complete by: As directed    Follow up mesenteric stenosis   Call MD for:  difficulty breathing, headache or visual disturbances   Complete by: As directed    Diet - low sodium heart healthy   Complete by: As directed    Diet - low sodium heart healthy   Complete by: As directed    Diet - low sodium heart healthy   Complete by: As directed    Increase activity slowly   Complete by: As directed    Increase activity slowly   Complete by: As directed    Increase activity slowly   Complete by: As directed       Allergies as of 05/20/2022       Reactions   Diclofenac Diarrhea   Lipitor [atorvastatin] Other (See Comments)   Muscle pain   Livalo [pitavastatin] Other (See Comments)   Muscle aches   Statins    Muscle aches        Medication List     STOP taking these medications    erythromycin ophthalmic ointment   lisinopril-hydrochlorothiazide 10-12.5 MG tablet Commonly known as: ZESTORETIC       TAKE these medications    amLODipine 10 MG tablet Commonly known as: NORVASC Take 1 tablet (10 mg total) by mouth daily. Start taking on: May 21, 2022   amoxicillin-clavulanate 875-125 MG tablet Commonly known as: AUGMENTIN Take 1 tablet by mouth 2 (two) times daily for 5 days.   apixaban 5 MG Tabs tablet Commonly known as:  ELIQUIS Take 1 tablet (5 mg total) by mouth 2 (two) times daily.   bisoprolol 10 MG tablet Commonly known as: ZEBETA Take 1 tablet (10 mg total) by mouth daily.   clopidogrel 75 MG tablet Commonly known as: PLAVIX TAKE 1 TABLET BY MOUTH EVERY DAY What changed: when to take this   fluocinonide cream 0.05 % Commonly known as: LIDEX Apply 1 Application topically 2 (two) times daily as needed (to affected areas for itching/swelling).   hydrALAZINE 100 MG tablet Commonly known as: APRESOLINE Take 1 tablet (100 mg total) by mouth 3 (three) times daily. What changed:  medication strength how much to take   ipratropium-albuterol 0.5-2.5 (3) MG/3ML Soln Commonly known as: DUONEB Take 3 mLs by nebulization every 6 (six) hours as needed.   NON FORMULARY Take 1 capsule by mouth See admin instructions. Beet plus capsules- Take 1 capsule by mouth once a day   NON FORMULARY Take 1 capsule by mouth See admin instructions. Juice Plus capsules- Take 1 capsule by mouth once a day   sildenafil 100 MG tablet Commonly known as: VIAGRA Take 100 mg by mouth daily as needed for erectile dysfunction (one hour prior to activity- max of 1 tablet/day).   sodium chloride 5 % ophthalmic solution Commonly known as: MURO 128 Place 1 drop into both eyes 3 (three) times daily.  tamsulosin 0.4 MG Caps capsule Commonly known as: FLOMAX Take 0.4 mg by mouth at bedtime.   Vitamin D3 125 MCG (5000 UT) Chew Chew 5,000 Units by mouth daily after breakfast.               Durable Medical Equipment  (From admission, onward)           Start     Ordered   05/20/22 1530  For home use only DME Nebulizer machine  Once       Question Answer Comment  Patient needs a nebulizer to treat with the following condition Pneumonia   Length of Need 6 Months      05/20/22 1529   05/18/22 1019  For home use only DME oxygen  Once       Question Answer Comment  Length of Need Lifetime   Mode or (Route)  Nasal cannula   Liters per Minute 4   Frequency Continuous (stationary and portable oxygen unit needed)   Oxygen conserving device Yes   Oxygen delivery system Gas      05/18/22 1019            Follow-up Information     [name], MD. Schedule an appointment as soon as possible for a visit in 2 week(s).   Specialty: Internal Medicine Contact information: [place] [address] [phone]         [facility] Follow up.   Specialty: Pulmonology Why: office will contact you with appointment Contact information: [address] [place] [address] [phone]        Waynetta Sandy, MD Follow up.   Specialties: Vascular Surgery, Cardiology Why: office will contact you with appointment Contact information: [place] [address] [place], [facility] Follow up in 2 day(s).   Specialty: Home Health Services Why: Will follow up with you 24 to 48 hrs after d/c. Contact information: [address] [place] [address] [phone]                Allergies  Allergen Reactions   Diclofenac Diarrhea   Lipitor [Atorvastatin] Other (See Comments)    Muscle pain   Livalo [Pitavastatin] Other (See Comments)    Muscle aches   Statins     Muscle aches    Consultations: Cardiology   Procedures/Studies: VAS US RENAL ARTERY DUPLEX  Result Date: 05/20/2022 ABDOMINAL VISCERAL Patient Name:  FINTAN GRATER  Date of Exam:   05/20/2022 Medical Rec #: [id number]         Accession #:    [id number] Date of Birth: 07-15-1945         Patient Gender: M Patient Age:   48 years Exam Location:  [facility] Procedure:      VAS US RENAL ARTERY DUPLEX Referring Phys: [place] -------------------------------------------------------------------------------- High Risk Factors: Hypertension, hyperlipidemia, past history of smoking. Other  Factors: PAD, Carotid stenosis s/p RT CEA, Afib, AAA s/p EVAR. Limitations: Respiratory variations. Comparison Study: No previous exams Performing Technologist: Jody Hill RVT, RDMS  Examination Guidelines: A complete evaluation includes B-mode imaging, spectral Doppler, color Doppler, and power Doppler as needed of all accessible portions of each vessel. Bilateral testing is considered an integral part of a complete examination. Limited examinations for reoccurring indications may be performed as noted.  Duplex Findings: +--------------------+--------+--------+------+--------+ Mesenteric  PSV cm/sEDV cm/sPlaqueComments +--------------------+--------+--------+------+--------+ Aorta Prox             55      12                  +--------------------+--------+--------+------+--------+ Celiac Artery Origin  242      60                  +--------------------+--------+--------+------+--------+ SMA Proximal          457      56                  +--------------------+--------+--------+------+--------+ SMA Mid               115      21                  +--------------------+--------+--------+------+--------+ SMA Distal             94      21                  +--------------------+--------+--------+------+--------+    +------------------+--------+--------+-------+ Right Renal ArteryPSV cm/sEDV cm/sComment +------------------+--------+--------+-------+ Origin              170      38           +------------------+--------+--------+-------+ Proximal            169      36           +------------------+--------+--------+-------+ Mid                  71      20           +------------------+--------+--------+-------+ Distal               58      21           +------------------+--------+--------+-------+ +-----------------+--------+--------+-------+ Left Renal ArteryPSV cm/sEDV cm/sComment +-----------------+--------+--------+-------+ Origin              154      40           +-----------------+--------+--------+-------+ Proximal            74      24           +-----------------+--------+--------+-------+ Mid                 74      25           +-----------------+--------+--------+-------+ Distal              77      25           +-----------------+--------+--------+-------+ +------------+--------+--------+----+-----------+--------+--------+----+ Right KidneyPSV cm/sEDV cm/sRI  Left KidneyPSV cm/sEDV cm/sRI   +------------+--------+--------+----+-----------+--------+--------+----+ Upper Pole  23      8       0.65Upper Pole 13      4       0.67 +------------+--------+--------+----+-----------+--------+--------+----+ Mid         71      22      0.71Mid        15      5       0.64 +------------+--------+--------+----+-----------+--------+--------+----+ Lower Pole  28      8       0.73Lower Pole 14      5       0.64 +------------+--------+--------+----+-----------+--------+--------+----+ Hilar       53  12      0.78Hilar                           +------------+--------+--------+----+-----------+--------+--------+----+ +------------------+-----+------------------+----+ Right Kidney           Left Kidney            +------------------+-----+------------------+----+ RAR                    RAR                    +------------------+-----+------------------+----+ RAR (manual)      3.09 RAR (manual)      2.8  +------------------+-----+------------------+----+ Cortex            17/6 Cortex            11/5 +------------------+-----+------------------+----+ Cortex thickness       Corex thickness        +------------------+-----+------------------+----+ Kidney length (cm)10.34Kidney length (cm)9.50 +------------------+-----+------------------+----+  Summary: Renal:  Right: 1-59% stenosis of the right renal artery. Abnormal right        Resistive Index. Normal size right kidney. RRV  flow present. Left:  1-59% stenosis of the left renal artery. Normal left        Resistive Index. Normal size of left kidney. LRV flow        present. 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  *See table(s) above for measurements and observations.  Diagnosing physician: Monica Martinez MD  Electronically signed by Monica Martinez MD on 05/20/2022 at 1:53:54 PM.    Final    DG CHEST PORT 1 VIEW  Result Date: 05/20/2022 CLINICAL DATA:  Shortness of breath.  History of hypertension. EXAM: PORTABLE CHEST 1 VIEW COMPARISON:  Radiographs and CT 05/14/2022.  Radiographs 11/19/2018. FINDINGS: 1120 hours. The heart size and mediastinal contours are stable with aortic atherosclerosis. There are persistent diffuse bilateral airspace opacities with minimal change compared with the prior studies of 6 days ago. Small to moderate bilateral pleural effusions. No pneumothorax. There are degenerative changes within the spine. Telemetry leads overlie the chest. IMPRESSION: Persistent diffuse bilateral airspace opacities and bilateral pleural effusions with minimal change compared with recent prior studies. Findings may reflect edema, inflammation or multilobar pneumonia. Electronically Signed   By: Richardean Sale M.D.   On: 05/20/2022 11:36   ECHOCARDIOGRAM COMPLETE  Result Date: 05/15/2022    ECHOCARDIOGRAM REPORT   Patient Name:   SEVILLE DOWNS Date of Exam: 05/15/2022 Medical Rec #:  [id number]        Height:       70.0 in Accession #:    [id number]       Weight:       178.6 lb Date of Birth:  [birth year]        BSA:          1.989 m Patient Age:    34 years         BP:           178/64 mmHg Patient Gender: M                HR:           90 bpm. Exam Location:  Inpatient Procedure: 2D Echo, Cardiac Doppler and Color Doppler Indications:    Abnl ECG  History:        Patient has prior history of Echocardiogram examinations, most  recent 05/17/2005. Abnormal ECG;  Risk Factors:Hypertension,                 Diabetes, Dyslipidemia and Former Smoker.  Sonographer:    Wenda Low Referring Phys: [id number] [place]  1. Left ventricular ejection fraction, by estimation, is 60 to 65%. The left ventricle has normal function. The left ventricle has no regional wall motion abnormalities. There is moderate concentric left ventricular hypertrophy. Left ventricular diastolic parameters are consistent with Grade II diastolic dysfunction (pseudonormalization).  2. Right ventricular systolic function is normal. The right ventricular size is normal. There is normal pulmonary artery systolic pressure. The estimated right ventricular systolic pressure is 25.9 mmHg.  3. Left atrial size was mildly dilated.  4. Right atrial size was mildly dilated.  5. The mitral valve is normal in structure. No evidence of mitral valve regurgitation. No evidence of mitral stenosis.  6. The aortic valve is tricuspid. There is moderate calcification of the aortic valve. Aortic valve regurgitation is not visualized. Mild aortic valve stenosis. Aortic valve mean gradient measures 12.0 mmHg.  7. The inferior vena cava is normal in size with greater than 50% respiratory variability, suggesting right atrial pressure of 3 mmHg. FINDINGS  Left Ventricle: Left ventricular ejection fraction, by estimation, is 60 to 65%. The left ventricle has normal function. The left ventricle has no regional wall motion abnormalities. The left ventricular internal cavity size was normal in size. There is  moderate concentric left ventricular hypertrophy. Left ventricular diastolic parameters are consistent with Grade II diastolic dysfunction (pseudonormalization). Right Ventricle: The right ventricular size is normal. No increase in right ventricular wall thickness. Right ventricular systolic function is normal. There is normal pulmonary artery systolic pressure. The tricuspid regurgitant velocity is 2.64 m/s,  and  with an assumed right atrial pressure of 3 mmHg, the estimated right ventricular systolic pressure is 56.3 mmHg. Left Atrium: Left atrial size was mildly dilated. Right Atrium: Right atrial size was mildly dilated. Pericardium: There is no evidence of pericardial effusion. Mitral Valve: The mitral valve is normal in structure. Mild mitral annular calcification. No evidence of mitral valve regurgitation. No evidence of mitral valve stenosis. MV peak gradient, 9.0 mmHg. The mean mitral valve gradient is 3.0 mmHg. Tricuspid Valve: The tricuspid valve is normal in structure. Tricuspid valve regurgitation is trivial. Aortic Valve: The aortic valve is tricuspid. There is moderate calcification of the aortic valve. Aortic valve regurgitation is not visualized. Mild aortic stenosis is present. Aortic valve mean gradient measures 12.0 mmHg. Aortic valve peak gradient measures 20.3 mmHg. Aortic valve area, by VTI measures 2.16 cm. Pulmonic Valve: The pulmonic valve was normal in structure. Pulmonic valve regurgitation is not visualized. Aorta: The aortic root is normal in size and structure. Venous: The inferior vena cava is normal in size with greater than 50% respiratory variability, suggesting right atrial pressure of 3 mmHg. IAS/Shunts: No atrial level shunt detected by color flow Doppler.  LEFT VENTRICLE PLAX 2D LVIDd:         4.70 cm   Diastology LVIDs:         3.40 cm   LV e' medial:    5.44 cm/s LV PW:         1.30 cm   LV E/e' medial:  23.9 LV IVS:        1.40 cm   LV e' lateral:   7.51 cm/s LVOT diam:     2.00 cm   LV E/e' lateral: 17.3 LV SV:  96 LV SV Index:   48 LVOT Area:     3.14 cm  RIGHT VENTRICLE RV Basal diam:  3.85 cm RV Mid diam:    3.60 cm RV S prime:     18.00 cm/s TAPSE (M-mode): 3.2 cm LEFT ATRIUM             Index        RIGHT ATRIUM           Index LA diam:        4.20 cm 2.11 cm/m   RA Area:     20.70 cm LA Vol (A2C):   76.2 ml 38.31 ml/m  RA Volume:   63.90 ml  32.12 ml/m LA Vol  (A4C):   66.0 ml 33.18 ml/m LA Biplane Vol: 71.0 ml 35.69 ml/m  AORTIC VALVE                     PULMONIC VALVE AV Area (Vmax):    1.91 cm      PV Vmax:       1.17 m/s AV Area (Vmean):   1.96 cm      PV Peak grad:  5.5 mmHg AV Area (VTI):     2.16 cm AV Vmax:           225.33 cm/s AV Vmean:          154.000 cm/s AV VTI:            0.446 m AV Peak Grad:      20.3 mmHg AV Mean Grad:      12.0 mmHg LVOT Vmax:         137.00 cm/s LVOT Vmean:        96.300 cm/s LVOT VTI:          0.307 m LVOT/AV VTI ratio: 0.69  AORTA Ao Root diam: 2.80 cm Ao Asc diam:  3.50 cm MITRAL VALVE                TRICUSPID VALVE MV Area (PHT): 5.79 cm     TR Peak grad:   27.9 mmHg MV Area VTI:   2.78 cm     TR Vmax:        264.00 cm/s MV Peak grad:  9.0 mmHg MV Mean grad:  3.0 mmHg     SHUNTS MV Vmax:       1.50 m/s     Systemic VTI:  0.31 m MV Vmean:      82.4 cm/s    Systemic Diam: 2.00 cm MV Decel Time: 131 msec MV E velocity: 130.00 cm/s MV A velocity: 98.50 cm/s MV E/A ratio:  1.32 Dalton McleanMD Electronically signed by Franki Monte Signature Date/Time: 05/15/2022/4:50:44 PM    Final    CT Angio Chest PE W and/or Wo Contrast  Result Date: 05/14/2022 CLINICAL DATA:  High suspicion of pulmonary embolism. EXAM: CT ANGIOGRAPHY CHEST WITH CONTRAST TECHNIQUE: Multidetector CT imaging of the chest was performed using the standard protocol during bolus administration of intravenous contrast. Multiplanar CT image reconstructions and MIPs were obtained to evaluate the vascular anatomy. RADIATION DOSE REDUCTION: This exam was performed according to the departmental dose-optimization program which includes automated exposure control, adjustment of the mA and/or kV according to patient size and/or use of iterative reconstruction technique. CONTRAST:  53m OMNIPAQUE IOHEXOL 350 MG/ML SOLN COMPARISON:  08/24/2018 FINDINGS: Cardiovascular: Heart is normal size. There is calcified plaque over the left main and 3 vessel coronary arteries.  Thoracic  aorta is normal in caliber. There is calcified plaque throughout the thoracic aorta. Pulmonary arterial system is well opacified without evidence of emboli. Remaining vascular structures are unremarkable. Mediastinum/Nodes: Mediastinal adenopathy over the pretracheal, precarinal and subcarinal regions with the largest node measuring 1.3 cm by short axis over the subcarinal region. No significant hilar adenopathy. Remaining mediastinal structures are unremarkable. Lungs/Pleura: Lungs are adequately inflated and demonstrate patchy bilateral airspace opacification likely multifocal pneumonia. Small to moderate size bilateral pleural effusions are present. Airways are unremarkable. Upper Abdomen: Calcified plaque over the abdominal aorta. There is an aortic stent graft beginning just below the level of the renal arteries on the most inferior images and therefore not completely evaluated. No acute findings in the visualized abdomen. Musculoskeletal: No focal abnormality. Review of the MIP images confirms the above findings. IMPRESSION: 1. No evidence of pulmonary embolism. 2. Patchy bilateral airspace opacification likely multifocal pneumonia. Small to moderate size bilateral pleural effusions. 3. Mediastinal adenopathy over the pretracheal, precarinal and subcarinal regions likely reactive. 4. Aortic atherosclerosis. Atherosclerotic coronary artery disease. Aortic Atherosclerosis (ICD10-I70.0). Electronically Signed   By: Marin Olp M.D.   On: 05/14/2022 15:16   DG Chest Port 1 View  Result Date: 05/14/2022 CLINICAL DATA:  Short of breath.  Productive cough EXAM: PORTABLE CHEST 1 VIEW COMPARISON:  11/19/2018 FINDINGS: Normal cardiac silhouette. There is fine bilateral perihilar airspace disease with a lower lobe distribution. Potential small effusions. No pneumothorax. IMPRESSION: Bilateral lower lobe predominant airspace disease suggests pulmonary edema. Multifocal pneumonia less favored. Electronically  Signed   By: Suzy Bouchard M.D.   On: 05/14/2022 12:51      Subjective: He says he feels well.  He is very eager to discharge home.  Oxygen has been delivered to his room.  Discharge Exam: Vitals:   05/19/22 1850 05/19/22 2026 05/20/22 0356 05/20/22 1427  BP:  (!) 164/67 (!) 167/66 (!) 171/68  Pulse:  80 73 66  Resp: (!) '21 17  20  '$ Temp:  97.8 F (36.6 C) 98.7 F (37.1 C) 97.6 F (36.4 C)  TempSrc:  Oral Oral Oral  SpO2: 93% (!) 89% 90% 93%  Weight:      Height:        General: Pt is alert, awake, not in acute distress Cardiovascular: RRR, S1/S2 +, no rubs, no gallops Respiratory: CTA bilaterally, no wheezing, no rhonchi Abdominal: Soft, NT, ND, bowel sounds + Extremities: no edema, no cyanosis    The results of significant diagnostics from this hospitalization (including imaging, microbiology, ancillary and laboratory) are listed below for reference.     Microbiology: Recent Results (from the past 240 hour(s))  Resp Panel by RT-PCR (Flu A&B, Covid) Anterior Nasal Swab     Status: None   Collection Time: 05/14/22 12:11 PM   Specimen: Anterior Nasal Swab  Result Value Ref Range Status   SARS Coronavirus 2 by RT PCR NEGATIVE NEGATIVE Final    Comment: (NOTE) SARS-CoV-2 target nucleic acids are NOT DETECTED.  The SARS-CoV-2 RNA is generally detectable in upper respiratory specimens during the acute phase of infection. The lowest concentration of SARS-CoV-2 viral copies this assay can detect is 138 copies/mL. A negative result does not preclude SARS-Cov-2 infection and should not be used as the sole basis for treatment or other patient management decisions. A negative result may occur with  improper specimen collection/handling, submission of specimen other than nasopharyngeal swab, presence of viral mutation(s) within the areas targeted by this assay, and inadequate number of viral copies(<138  copies/mL). A negative result must be combined with clinical  observations, patient history, and epidemiological information. The expected result is Negative.  Fact Sheet for Patients:  EntrepreneurPulse.com.au  Fact Sheet for Healthcare Providers:  IncredibleEmployment.be  This test is no t yet approved or cleared by the [place] FDA and  has been authorized for detection and/or diagnosis of SARS-CoV-2 by FDA under an Emergency Use Authorization (EUA). This EUA will remain  in effect (meaning this test can be used) for the duration of the COVID-19 declaration under Section 564(b)(1) of the Act, 21 U.S.C.section 360bbb-3(b)(1), unless the authorization is terminated  or revoked sooner.       Influenza A by PCR NEGATIVE NEGATIVE Final   Influenza B by PCR NEGATIVE NEGATIVE Final    Comment: (NOTE) The Xpert Xpress SARS-CoV-2/FLU/RSV plus assay is intended as an aid in the diagnosis of influenza from Nasopharyngeal swab specimens and should not be used as a sole basis for treatment. Nasal washings and aspirates are unacceptable for Xpert Xpress SARS-CoV-2/FLU/RSV testing.  Fact Sheet for Patients: EntrepreneurPulse.com.au  Fact Sheet for Healthcare Providers: IncredibleEmployment.be  This test is not yet approved or cleared by the [place] FDA and has been authorized for detection and/or diagnosis of SARS-CoV-2 by FDA under an Emergency Use Authorization (EUA). This EUA will remain in effect (meaning this test can be used) for the duration of the COVID-19 declaration under Section 564(b)(1) of the Act, 21 U.S.C. section 360bbb-3(b)(1), unless the authorization is terminated or revoked.  Performed at [facility], [place] [address] [place], [place] [address]   Culture, blood (routine x 2)     Status: None   Collection Time: 05/14/22  5:52 PM   Specimen: BLOOD  Result Value Ref Range Status   Specimen Description   Final    BLOOD RIGHT  ANTECUBITAL Performed at [place] [address], [place], [place] [address]    Special Requests   Final    BOTTLES DRAWN AEROBIC AND ANAEROBIC Blood Culture adequate volume Performed at [place] [address], [place], [place] [address]    Culture   Final    NO GROWTH 5 DAYS Performed at [facility], [place] [address], [place], [place] [address]    Report Status 05/19/2022 FINAL  Final  MRSA Next Gen by PCR, Nasal     Status: None   Collection Time: 05/14/22  8:33 PM   Specimen: Nasal Mucosa; Nasal Swab  Result Value Ref Range Status   MRSA by PCR Next Gen NOT DETECTED NOT DETECTED Final    Comment: (NOTE) The GeneXpert MRSA Assay (FDA approved for NASAL specimens only), is one component of a comprehensive MRSA colonization surveillance program. It is not intended to diagnose MRSA infection nor to guide or monitor treatment for MRSA infections. Test performance is not FDA approved in patients less than 71 years old. Performed at [facility], [place] [address], [place], [place] [address]   Culture, blood (routine x 2)     Status: None   Collection Time: 05/14/22  9:15 PM   Specimen: BLOOD  Result Value Ref Range Status   Specimen Description   Final    BLOOD BLOOD RIGHT HAND Performed at [place] [address], [place], [place] [address]    Special Requests   Final    BOTTLES DRAWN AEROBIC AND ANAEROBIC Blood Culture adequate volume Performed at [facility]  [place] [place], [place] [address]    Culture   Final    NO GROWTH 5 DAYS Performed at [facility], [place] [address] [place], [place] [address]    Report Status 05/20/2022 FINAL  Final     Labs: BNP (last 3 results) Recent Labs    05/14/22 1210  BNP 592.9*   Basic Metabolic Panel: Recent Labs  Lab 05/15/22 0319 05/16/22 0320 05/18/22 0419 05/19/22 0349 05/20/22 0345  NA 134*  135 139 137 138  K 4.0 3.5 3.8 3.8 4.3  CL 101 102 108 107 107  CO2 22 21* 21* 21* 21*  GLUCOSE 123* 119* 123* 106* 117*  BUN 21 28* 36* 31* 37*  CREATININE 1.56* 1.62* 1.86* 1.40* 1.66*  CALCIUM 8.5* 8.6* 8.7* 8.9 9.2  MG  --  1.9 1.9  --   --   PHOS  --  3.6  --   --   --    Liver Function Tests: Recent Labs  Lab 05/15/22 0319 05/16/22 0320  AST 17 19  ALT 14 15  ALKPHOS 35* 39  BILITOT 0.8 0.8  PROT 6.2* 6.6  ALBUMIN 3.2* 3.3*   No results for input(s): "LIPASE", "AMYLASE" in the last 168 hours. No results for input(s): "AMMONIA" in the last 168 hours. CBC: Recent Labs  Lab 05/14/22 1210 05/15/22 0319 05/16/22 0320 05/17/22 0414 05/18/22 0419 05/19/22 0349 05/20/22 0345  WBC 12.6*   < > 11.4* 8.2 7.4 7.9 10.0  NEUTROABS 10.2*  --  8.8*  --   --   --   --   HGB 11.6*   < > 9.9* 8.7* 8.7* 9.6* 9.1*  HCT 34.6*   < > 29.4* 26.0* 26.3* 28.9* 27.3*  MCV 98.9   < > 99.7 99.6 100.0 100.7* 98.2  PLT 197   < > 196 171 175 210 239   < > = values in this interval not displayed.   Cardiac Enzymes: No results for input(s): "CKTOTAL", "CKMB", "CKMBINDEX", "TROPONINI" in the last 168 hours. BNP: Invalid input(s): "POCBNP" CBG: Recent Labs  Lab 05/16/22 1143  GLUCAP 103*   D-Dimer No results for input(s): "DDIMER" in the last 72 hours. Hgb A1c No results for input(s): "HGBA1C" in the last 72 hours. Lipid Profile No results for input(s): "CHOL", "HDL", "LDLCALC", "TRIG", "CHOLHDL", "LDLDIRECT" in the last 72 hours. Thyroid function studies No results for input(s): "TSH", "T4TOTAL", "T3FREE", "THYROIDAB" in the last 72 hours.  Invalid input(s): "FREET3" Anemia work up No results for input(s): "VITAMINB12", "FOLATE", "FERRITIN", "TIBC", "IRON", "RETICCTPCT" in the last 72 hours. Urinalysis    Component Value Date/Time   COLORURINE YELLOW 11/18/2018 1500   APPEARANCEUR CLEAR 11/18/2018 1500   LABSPEC 1.015 11/18/2018 1500   PHURINE 6.0 11/18/2018 1500   GLUCOSEU  NEGATIVE 11/18/2018 1500   HGBUR NEGATIVE 11/18/2018 1500   BILIRUBINUR NEGATIVE 11/18/2018 1500   KETONESUR NEGATIVE 11/18/2018 1500   PROTEINUR NEGATIVE 11/18/2018 1500   NITRITE NEGATIVE 11/18/2018 1500   LEUKOCYTESUR NEGATIVE 11/18/2018 1500   Sepsis Labs Recent Labs  Lab 05/17/22 0414 05/18/22 0419 05/19/22 0349 05/20/22 0345  WBC 8.2 7.4 7.9 10.0   Microbiology Recent Results (from the past 240 hour(s))  Resp Panel by RT-PCR (Flu A&B, Covid) Anterior Nasal Swab     Status: None   Collection Time: 05/14/22 12:11 PM   Specimen: Anterior Nasal Swab  Result Value Ref Range Status   SARS Coronavirus 2 by RT PCR NEGATIVE NEGATIVE Final    Comment: (  NOTE) SARS-CoV-2 target nucleic acids are NOT DETECTED.  The SARS-CoV-2 RNA is generally detectable in upper respiratory specimens during the acute phase of infection. The lowest concentration of SARS-CoV-2 viral copies this assay can detect is 138 copies/mL. A negative result does not preclude SARS-Cov-2 infection and should not be used as the sole basis for treatment or other patient management decisions. A negative result may occur with  improper specimen collection/handling, submission of specimen other than nasopharyngeal swab, presence of viral mutation(s) within the areas targeted by this assay, and inadequate number of viral copies(<138 copies/mL). A negative result must be combined with clinical observations, patient history, and epidemiological information. The expected result is Negative.  Fact Sheet for Patients:  EntrepreneurPulse.com.au  Fact Sheet for Healthcare Providers:  IncredibleEmployment.be  This test is no t yet approved or cleared by the [place] FDA and  has been authorized for detection and/or diagnosis of SARS-CoV-2 by FDA under an Emergency Use Authorization (EUA). This EUA will remain  in effect (meaning this test can be used) for the duration of  the COVID-19 declaration under Section 564(b)(1) of the Act, 21 U.S.C.section 360bbb-3(b)(1), unless the authorization is terminated  or revoked sooner.       Influenza A by PCR NEGATIVE NEGATIVE Final   Influenza B by PCR NEGATIVE NEGATIVE Final    Comment: (NOTE) The Xpert Xpress SARS-CoV-2/FLU/RSV plus assay is intended as an aid in the diagnosis of influenza from Nasopharyngeal swab specimens and should not be used as a sole basis for treatment. Nasal washings and aspirates are unacceptable for Xpert Xpress SARS-CoV-2/FLU/RSV testing.  Fact Sheet for Patients: EntrepreneurPulse.com.au  Fact Sheet for Healthcare Providers: IncredibleEmployment.be  This test is not yet approved or cleared by the [place] FDA and has been authorized for detection and/or diagnosis of SARS-CoV-2 by FDA under an Emergency Use Authorization (EUA). This EUA will remain in effect (meaning this test can be used) for the duration of the COVID-19 declaration under Section 564(b)(1) of the Act, 21 U.S.C. section 360bbb-3(b)(1), unless the authorization is terminated or revoked.  Performed at [facility], [place] [address], [place], [place] [address]   Culture, blood (routine x 2)     Status: None   Collection Time: 05/14/22  5:52 PM   Specimen: BLOOD  Result Value Ref Range Status   Specimen Description   Final    BLOOD RIGHT ANTECUBITAL Performed at [place] [address], [place], [place] [address]    Special Requests   Final    BOTTLES DRAWN AEROBIC AND ANAEROBIC Blood Culture adequate volume Performed at [facility] [address] [place], [place] [address]    Culture   Final    NO GROWTH 5 DAYS Performed at [facility], [place] [address] [place], [place] [address]    Report Status 05/19/2022 FINAL  Final  MRSA Next Gen by PCR, Nasal     Status: None   Collection Time: 05/14/22   8:33 PM   Specimen: Nasal Mucosa; Nasal Swab  Result Value Ref Range Status   MRSA by PCR Next Gen NOT DETECTED NOT DETECTED Final    Comment: (NOTE) The GeneXpert MRSA Assay (FDA approved for NASAL specimens only), is one component of a comprehensive MRSA colonization surveillance program. It is not intended to diagnose MRSA infection nor to guide or monitor treatment for MRSA infections. Test performance is not FDA approved in patients less than 74 years old. Performed at [facility]  [place] [address], [place], [place] [address]   Culture, blood (routine x 2)     Status: None   Collection Time: 05/14/22  9:15 PM   Specimen: BLOOD  Result Value Ref Range Status   Specimen Description   Final    BLOOD BLOOD RIGHT HAND Performed at [place] [address] [place], [place] [address]    Special Requests   Final    BOTTLES DRAWN AEROBIC AND ANAEROBIC Blood Culture adequate volume Performed at [place] [address] [place], [place] [address]    Culture   Final    NO GROWTH 5 DAYS Performed at [facility], [place] [address], [place], [place] [address]    Report Status 05/20/2022 FINAL  Final     Time coordinating discharge: 16mns  SIGNED:   JKathie Dike MD  [facility] 05/20/2022, 8:56 PM   If 7PM-7AM, please contact night-coverage [email or web address]

## 2022-05-20 NOTE — Care Management Important Message (Signed)
Important Message  Patient Details IM Letter placed in Patient's room. Name: Samuel [name] MRN: [id number] Date of Birth: [birth year]   Medicare Important Message Given:  Yes     Kerin Salen 05/20/2022, 12:50 PM

## 2022-05-20 NOTE — TOC Transition Note (Signed)
Transition of Care [facility]) - CM/SW Discharge Note   Patient Details  Name: Samuel [name] MRN: [id number] Date of Birth: 04-08-1946  Transition of Care [facility]) CM/SW Contact:  Illene Regulus, LCSW Phone Number: 05/20/2022, 3:58 PM   Clinical Narrative:    CSW spoke with pt , he has received his O2 , PT/OT has been set up through [facility].  Referral for nebulizer machine sent to [facility] for delivery to pt's home. No additional need TOC sign-off.    Final next level of care: [facility] [place] Barriers to Discharge: No Barriers Identified   Patient Goals and CMS Choice Patient states their goals for this hospitalization and ongoing recovery are:: retun home CMS Medicare.gov Compare Post Acute Care list provided to:: Patient Choice offered to / list presented to : Patient  Discharge Placement                       Discharge Plan and Services                DME Arranged: Oxygen DME Agency: [employer]       HH Arranged: OT, PT [place] Agency: [facility] Date [place]: 05/20/22 Time [place]: 8502 Representative spoke with at [place]: [place] Determinants of Health ([place]) Interventions     Readmission Risk Interventions     No data to display

## 2022-05-23 ENCOUNTER — Inpatient Hospital Stay (HOSPITAL_COMMUNITY)
Admission: EM | Admit: 2022-05-23 | Discharge: 2022-06-10 | DRG: 871 | Disposition: E | Payer: Medicare Other | Attending: Pulmonary Disease | Admitting: Pulmonary Disease

## 2022-05-23 ENCOUNTER — Encounter (HOSPITAL_COMMUNITY): Payer: Self-pay

## 2022-05-23 ENCOUNTER — Emergency Department (HOSPITAL_COMMUNITY): Payer: Medicare Other

## 2022-05-23 ENCOUNTER — Inpatient Hospital Stay (HOSPITAL_COMMUNITY): Payer: Medicare Other

## 2022-05-23 ENCOUNTER — Other Ambulatory Visit: Payer: Self-pay

## 2022-05-23 DIAGNOSIS — N1832 Chronic kidney disease, stage 3b: Secondary | ICD-10-CM | POA: Diagnosis present

## 2022-05-23 DIAGNOSIS — I252 Old myocardial infarction: Secondary | ICD-10-CM

## 2022-05-23 DIAGNOSIS — I714 Abdominal aortic aneurysm, without rupture, unspecified: Secondary | ICD-10-CM | POA: Diagnosis present

## 2022-05-23 DIAGNOSIS — J189 Pneumonia, unspecified organism: Secondary | ICD-10-CM | POA: Diagnosis present

## 2022-05-23 DIAGNOSIS — D631 Anemia in chronic kidney disease: Secondary | ICD-10-CM | POA: Diagnosis present

## 2022-05-23 DIAGNOSIS — Z66 Do not resuscitate: Secondary | ICD-10-CM | POA: Diagnosis not present

## 2022-05-23 DIAGNOSIS — E785 Hyperlipidemia, unspecified: Secondary | ICD-10-CM | POA: Diagnosis present

## 2022-05-23 DIAGNOSIS — E11649 Type 2 diabetes mellitus with hypoglycemia without coma: Secondary | ICD-10-CM | POA: Diagnosis not present

## 2022-05-23 DIAGNOSIS — R600 Localized edema: Secondary | ICD-10-CM | POA: Diagnosis present

## 2022-05-23 DIAGNOSIS — R579 Shock, unspecified: Secondary | ICD-10-CM | POA: Diagnosis present

## 2022-05-23 DIAGNOSIS — Z515 Encounter for palliative care: Secondary | ICD-10-CM

## 2022-05-23 DIAGNOSIS — E875 Hyperkalemia: Secondary | ICD-10-CM | POA: Diagnosis present

## 2022-05-23 DIAGNOSIS — J9601 Acute respiratory failure with hypoxia: Secondary | ICD-10-CM | POA: Diagnosis not present

## 2022-05-23 DIAGNOSIS — Z1152 Encounter for screening for COVID-19: Secondary | ICD-10-CM

## 2022-05-23 DIAGNOSIS — I959 Hypotension, unspecified: Secondary | ICD-10-CM | POA: Diagnosis not present

## 2022-05-23 DIAGNOSIS — I249 Acute ischemic heart disease, unspecified: Secondary | ICD-10-CM | POA: Diagnosis present

## 2022-05-23 DIAGNOSIS — E874 Mixed disorder of acid-base balance: Secondary | ICD-10-CM | POA: Diagnosis present

## 2022-05-23 DIAGNOSIS — Z87891 Personal history of nicotine dependence: Secondary | ICD-10-CM

## 2022-05-23 DIAGNOSIS — I358 Other nonrheumatic aortic valve disorders: Secondary | ICD-10-CM | POA: Diagnosis present

## 2022-05-23 DIAGNOSIS — F419 Anxiety disorder, unspecified: Secondary | ICD-10-CM | POA: Diagnosis present

## 2022-05-23 DIAGNOSIS — I5033 Acute on chronic diastolic (congestive) heart failure: Secondary | ICD-10-CM | POA: Diagnosis present

## 2022-05-23 DIAGNOSIS — D6489 Other specified anemias: Secondary | ICD-10-CM | POA: Diagnosis present

## 2022-05-23 DIAGNOSIS — Z9911 Dependence on respirator [ventilator] status: Secondary | ICD-10-CM | POA: Diagnosis not present

## 2022-05-23 DIAGNOSIS — Z7901 Long term (current) use of anticoagulants: Secondary | ICD-10-CM

## 2022-05-23 DIAGNOSIS — Z95828 Presence of other vascular implants and grafts: Secondary | ICD-10-CM | POA: Diagnosis not present

## 2022-05-23 DIAGNOSIS — F1090 Alcohol use, unspecified, uncomplicated: Secondary | ICD-10-CM | POA: Diagnosis present

## 2022-05-23 DIAGNOSIS — E1122 Type 2 diabetes mellitus with diabetic chronic kidney disease: Secondary | ICD-10-CM | POA: Diagnosis present

## 2022-05-23 DIAGNOSIS — J810 Acute pulmonary edema: Secondary | ICD-10-CM | POA: Diagnosis not present

## 2022-05-23 DIAGNOSIS — Z781 Physical restraint status: Secondary | ICD-10-CM

## 2022-05-23 DIAGNOSIS — I739 Peripheral vascular disease, unspecified: Secondary | ICD-10-CM | POA: Diagnosis not present

## 2022-05-23 DIAGNOSIS — F40240 Claustrophobia: Secondary | ICD-10-CM | POA: Diagnosis present

## 2022-05-23 DIAGNOSIS — I25110 Atherosclerotic heart disease of native coronary artery with unstable angina pectoris: Secondary | ICD-10-CM | POA: Diagnosis present

## 2022-05-23 DIAGNOSIS — R042 Hemoptysis: Secondary | ICD-10-CM | POA: Diagnosis present

## 2022-05-23 DIAGNOSIS — J80 Acute respiratory distress syndrome: Secondary | ICD-10-CM | POA: Diagnosis present

## 2022-05-23 DIAGNOSIS — I4891 Unspecified atrial fibrillation: Secondary | ICD-10-CM

## 2022-05-23 DIAGNOSIS — K7200 Acute and subacute hepatic failure without coma: Secondary | ICD-10-CM | POA: Diagnosis present

## 2022-05-23 DIAGNOSIS — Z8701 Personal history of pneumonia (recurrent): Secondary | ICD-10-CM

## 2022-05-23 DIAGNOSIS — R6520 Severe sepsis without septic shock: Secondary | ICD-10-CM | POA: Diagnosis not present

## 2022-05-23 DIAGNOSIS — Z888 Allergy status to other drugs, medicaments and biological substances status: Secondary | ICD-10-CM

## 2022-05-23 DIAGNOSIS — E8729 Other acidosis: Secondary | ICD-10-CM | POA: Diagnosis present

## 2022-05-23 DIAGNOSIS — D72829 Elevated white blood cell count, unspecified: Secondary | ICD-10-CM | POA: Diagnosis present

## 2022-05-23 DIAGNOSIS — Z79899 Other long term (current) drug therapy: Secondary | ICD-10-CM

## 2022-05-23 DIAGNOSIS — E1151 Type 2 diabetes mellitus with diabetic peripheral angiopathy without gangrene: Secondary | ICD-10-CM | POA: Diagnosis present

## 2022-05-23 DIAGNOSIS — A419 Sepsis, unspecified organism: Principal | ICD-10-CM | POA: Diagnosis present

## 2022-05-23 DIAGNOSIS — I214 Non-ST elevation (NSTEMI) myocardial infarction: Secondary | ICD-10-CM | POA: Diagnosis not present

## 2022-05-23 DIAGNOSIS — Z83438 Family history of other disorder of lipoprotein metabolism and other lipidemia: Secondary | ICD-10-CM

## 2022-05-23 DIAGNOSIS — R059 Cough, unspecified: Secondary | ICD-10-CM | POA: Diagnosis not present

## 2022-05-23 DIAGNOSIS — J90 Pleural effusion, not elsewhere classified: Secondary | ICD-10-CM | POA: Diagnosis not present

## 2022-05-23 DIAGNOSIS — Z4682 Encounter for fitting and adjustment of non-vascular catheter: Secondary | ICD-10-CM | POA: Diagnosis not present

## 2022-05-23 DIAGNOSIS — Z8249 Family history of ischemic heart disease and other diseases of the circulatory system: Secondary | ICD-10-CM

## 2022-05-23 DIAGNOSIS — I480 Paroxysmal atrial fibrillation: Secondary | ICD-10-CM | POA: Diagnosis not present

## 2022-05-23 DIAGNOSIS — I5031 Acute diastolic (congestive) heart failure: Secondary | ICD-10-CM | POA: Diagnosis not present

## 2022-05-23 DIAGNOSIS — Z7902 Long term (current) use of antithrombotics/antiplatelets: Secondary | ICD-10-CM

## 2022-05-23 DIAGNOSIS — I130 Hypertensive heart and chronic kidney disease with heart failure and stage 1 through stage 4 chronic kidney disease, or unspecified chronic kidney disease: Secondary | ICD-10-CM | POA: Diagnosis not present

## 2022-05-23 DIAGNOSIS — R6521 Severe sepsis with septic shock: Secondary | ICD-10-CM | POA: Diagnosis not present

## 2022-05-23 DIAGNOSIS — N179 Acute kidney failure, unspecified: Secondary | ICD-10-CM | POA: Diagnosis present

## 2022-05-23 DIAGNOSIS — Z452 Encounter for adjustment and management of vascular access device: Secondary | ICD-10-CM | POA: Diagnosis not present

## 2022-05-23 DIAGNOSIS — I5032 Chronic diastolic (congestive) heart failure: Secondary | ICD-10-CM | POA: Diagnosis not present

## 2022-05-23 DIAGNOSIS — Z9981 Dependence on supplemental oxygen: Secondary | ICD-10-CM

## 2022-05-23 DIAGNOSIS — R7309 Other abnormal glucose: Secondary | ICD-10-CM | POA: Diagnosis present

## 2022-05-23 LAB — GLUCOSE, CAPILLARY
Glucose-Capillary: 146 mg/dL — ABNORMAL HIGH (ref 70–99)
Glucose-Capillary: 137 mg/dL — ABNORMAL HIGH (ref 70–99)

## 2022-05-23 LAB — BLOOD GAS, VENOUS
Acid-base deficit: 2.9 mmol/L — ABNORMAL HIGH (ref 0.0–2.0)
Bicarbonate: 20.9 mmol/L (ref 20.0–28.0)
O2 Saturation: 94.9 %
Patient temperature: 37
pCO2, Ven: 33 mmHg — ABNORMAL LOW (ref 44–60)
pH, Ven: 7.41 (ref 7.25–7.43)
pO2, Ven: 72 mmHg — ABNORMAL HIGH (ref 32–45)

## 2022-05-23 LAB — COMPREHENSIVE METABOLIC PANEL
ALT: 40 U/L (ref 0–44)
AST: 36 U/L (ref 15–41)
Albumin: 3.3 g/dL — ABNORMAL LOW (ref 3.5–5.0)
Alkaline Phosphatase: 59 U/L (ref 38–126)
Anion gap: 11 (ref 5–15)
BUN: 76 mg/dL — ABNORMAL HIGH (ref 8–23)
CO2: 20 mmol/L — ABNORMAL LOW (ref 22–32)
Calcium: 9.2 mg/dL (ref 8.9–10.3)
Chloride: 106 mmol/L (ref 98–111)
Creatinine, Ser: 2.45 mg/dL — ABNORMAL HIGH (ref 0.61–1.24)
GFR, Estimated: 26 mL/min — ABNORMAL LOW (ref >60)
Glucose, Bld: 124 mg/dL — ABNORMAL HIGH (ref 70–99)
Potassium: 4.3 mmol/L (ref 3.5–5.1)
Sodium: 137 mmol/L (ref 135–145)
Total Bilirubin: 0.8 mg/dL (ref 0.3–1.2)
Total Protein: 7.2 g/dL (ref 6.5–8.1)

## 2022-05-23 LAB — CBC
HCT: 24.8 % — ABNORMAL LOW (ref 39.0–52.0)
Hemoglobin: 8.2 g/dL — ABNORMAL LOW (ref 13.0–17.0)
MCH: 33.2 pg (ref 26.0–34.0)
MCHC: 33.1 g/dL (ref 30.0–36.0)
MCV: 100.4 fL — ABNORMAL HIGH (ref 80.0–100.0)
Platelets: 280 10*3/uL (ref 150–400)
RBC: 2.47 MIL/uL — ABNORMAL LOW (ref 4.22–5.81)
RDW: 13.2 % (ref 11.5–15.5)
WBC: 14.7 10*3/uL — ABNORMAL HIGH (ref 4.0–10.5)
nRBC: 0 % (ref 0.0–0.2)

## 2022-05-23 LAB — CBC WITH DIFFERENTIAL/PLATELET
Abs Immature Granulocytes: 0.13 10*3/uL — ABNORMAL HIGH (ref 0.00–0.07)
Basophils Absolute: 0 10*3/uL (ref 0.0–0.1)
Basophils Relative: 0 %
Eosinophils Absolute: 0 10*3/uL (ref 0.0–0.5)
Eosinophils Relative: 0 %
HCT: 25.8 % — ABNORMAL LOW (ref 39.0–52.0)
Hemoglobin: 8.5 g/dL — ABNORMAL LOW (ref 13.0–17.0)
Immature Granulocytes: 1 %
Lymphocytes Relative: 8 %
Lymphs Abs: 1 10*3/uL (ref 0.7–4.0)
MCH: 32.2 pg (ref 26.0–34.0)
MCHC: 32.9 g/dL (ref 30.0–36.0)
MCV: 97.7 fL (ref 80.0–100.0)
Monocytes Absolute: 1.3 10*3/uL — ABNORMAL HIGH (ref 0.1–1.0)
Monocytes Relative: 11 %
Neutro Abs: 9.8 10*3/uL — ABNORMAL HIGH (ref 1.7–7.7)
Neutrophils Relative %: 80 %
Platelets: 316 10*3/uL (ref 150–400)
RBC: 2.64 MIL/uL — ABNORMAL LOW (ref 4.22–5.81)
RDW: 13.2 % (ref 11.5–15.5)
WBC: 12.3 10*3/uL — ABNORMAL HIGH (ref 4.0–10.5)
nRBC: 0 % (ref 0.0–0.2)

## 2022-05-23 LAB — MAGNESIUM: Magnesium: 2.5 mg/dL — ABNORMAL HIGH (ref 1.7–2.4)

## 2022-05-23 LAB — BLOOD GAS, ARTERIAL
Acid-base deficit: 4 mmol/L — ABNORMAL HIGH (ref 0.0–2.0)
Acid-base deficit: 5 mmol/L — ABNORMAL HIGH (ref 0.0–2.0)
Bicarbonate: 20.9 mmol/L (ref 20.0–28.0)
Bicarbonate: 21.1 mmol/L (ref 20.0–28.0)
O2 Saturation: 95.7 %
O2 Saturation: 98.5 %
Patient temperature: 37
Patient temperature: 36.5
pCO2 arterial: 37 mmHg (ref 32–48)
pCO2 arterial: 41 mmHg (ref 32–48)
pH, Arterial: 7.36 (ref 7.35–7.45)
pH, Arterial: 7.32 — ABNORMAL LOW (ref 7.35–7.45)
pO2, Arterial: 150 mmHg — ABNORMAL HIGH (ref 83–108)
pO2, Arterial: 199 mmHg — ABNORMAL HIGH (ref 83–108)

## 2022-05-23 LAB — PHOSPHORUS: Phosphorus: 6.5 mg/dL — ABNORMAL HIGH (ref 2.5–4.6)

## 2022-05-23 LAB — CREATININE, SERUM
Creatinine, Ser: 2.53 mg/dL — ABNORMAL HIGH (ref 0.61–1.24)
GFR, Estimated: 25 mL/min — ABNORMAL LOW (ref >60)

## 2022-05-23 LAB — BRAIN NATRIURETIC PEPTIDE: B Natriuretic Peptide: 1240.2 pg/mL — ABNORMAL HIGH (ref 0.0–100.0)

## 2022-05-23 LAB — MRSA NEXT GEN BY PCR, NASAL: MRSA by PCR Next Gen: NOT DETECTED

## 2022-05-23 LAB — LACTIC ACID, PLASMA
Lactic Acid, Venous: 1.4 mmol/L (ref 0.5–1.9)
Lactic Acid, Venous: 1.2 mmol/L (ref 0.5–1.9)

## 2022-05-23 LAB — TROPONIN I (HIGH SENSITIVITY)
Troponin I (High Sensitivity): 66 ng/L — ABNORMAL HIGH (ref <18)
Troponin I (High Sensitivity): 84 ng/L — ABNORMAL HIGH (ref <18)

## 2022-05-23 LAB — RESP PANEL BY RT-PCR (RSV, FLU A&B, COVID)  RVPGX2
Influenza A by PCR: NEGATIVE
Influenza B by PCR: NEGATIVE
Resp Syncytial Virus by PCR: NEGATIVE
SARS Coronavirus 2 by RT PCR: NEGATIVE

## 2022-05-23 LAB — APTT: aPTT: 37 seconds — ABNORMAL HIGH (ref 24–36)

## 2022-05-23 LAB — PROTIME-INR
INR: 2.4 — ABNORMAL HIGH (ref 0.8–1.2)
Prothrombin Time: 26.2 seconds — ABNORMAL HIGH (ref 11.4–15.2)

## 2022-05-23 LAB — PROCALCITONIN: Procalcitonin: 0.17 ng/mL

## 2022-05-23 MED ORDER — ORAL CARE MOUTH RINSE: 15.0000 mL | OROMUCOSAL | Status: DC | PRN

## 2022-05-23 MED ORDER — VANCOMYCIN HCL 750 MG/150ML IV SOLN: 750.0000 mg | INTRAVENOUS | Status: DC

## 2022-05-23 MED ORDER — HEPARIN (PORCINE) 25000 UT/250ML-% IV SOLN
1350.0000 [IU]/h | INTRAVENOUS | Status: DC
  Administered 2022-05-23: 1200 [IU]/h via INTRAVENOUS
  Administered 2022-05-24 – 2022-05-25 (×2): 1350 [IU]/h via INTRAVENOUS
  Filled 2022-05-23 (×3): qty 250

## 2022-05-23 MED ORDER — HEPARIN SODIUM (PORCINE) 5000 UNIT/ML IJ SOLN: 5000.0000 [IU] | Freq: Three times a day (TID) | INTRAMUSCULAR | Status: DC

## 2022-05-23 MED ORDER — LORAZEPAM 2 MG/ML IJ SOLN
1.0000 mg | Freq: Once | INTRAMUSCULAR | Status: AC
  Administered 2022-05-23: 1 mg via INTRAVENOUS
  Filled 2022-05-23: qty 1

## 2022-05-23 MED ORDER — SODIUM CHLORIDE 0.9 % IV SOLN
2.0000 g | Freq: Once | INTRAVENOUS | Status: AC
  Administered 2022-05-23: 2 g via INTRAVENOUS
  Filled 2022-05-23: qty 12.5

## 2022-05-23 MED ORDER — ETOMIDATE 2 MG/ML IV SOLN
INTRAVENOUS | Status: AC
  Filled 2022-05-23: qty 20

## 2022-05-23 MED ORDER — MIDAZOLAM HCL 2 MG/2ML IJ SOLN
INTRAMUSCULAR | Status: AC
  Filled 2022-05-23: qty 2

## 2022-05-23 MED ORDER — POLYETHYLENE GLYCOL 3350 17 G PO PACK: 17.0000 g | PACK | Freq: Every day | ORAL | Status: DC | PRN

## 2022-05-23 MED ORDER — POLYETHYLENE GLYCOL 3350 17 G PO PACK
17.0000 g | PACK | Freq: Every day | ORAL | Status: DC
  Administered 2022-05-23 – 2022-05-25 (×3): 17 g
  Filled 2022-05-23 (×4): qty 1

## 2022-05-23 MED ORDER — ORAL CARE MOUTH RINSE
15.0000 mL | OROMUCOSAL | Status: DC
  Administered 2022-05-23: 15 mL via OROMUCOSAL

## 2022-05-23 MED ORDER — ONDANSETRON HCL 4 MG/2ML IJ SOLN: 4.0000 mg | Freq: Four times a day (QID) | INTRAMUSCULAR | Status: DC | PRN

## 2022-05-23 MED ORDER — VITAL HIGH PROTEIN PO LIQD
1000.0000 mL | ORAL | Status: DC
  Administered 2022-05-24: 1000 mL

## 2022-05-23 MED ORDER — LACTATED RINGERS IV BOLUS
500.0000 mL | Freq: Once | INTRAVENOUS | Status: AC
  Administered 2022-05-23: 500 mL via INTRAVENOUS

## 2022-05-23 MED ORDER — PROPOFOL 1000 MG/100ML IV EMUL
0.0000 ug/kg/min | INTRAVENOUS | Status: DC
  Administered 2022-05-23: 35 ug/kg/min via INTRAVENOUS
  Administered 2022-05-23: 5 ug/kg/min via INTRAVENOUS
  Administered 2022-05-24: 25 ug/kg/min via INTRAVENOUS
  Administered 2022-05-24: 35 ug/kg/min via INTRAVENOUS
  Administered 2022-05-24: 25 ug/kg/min via INTRAVENOUS
  Administered 2022-05-25: 20 ug/kg/min via INTRAVENOUS
  Administered 2022-05-25 (×2): 25 ug/kg/min via INTRAVENOUS
  Filled 2022-05-23 (×7): qty 100

## 2022-05-23 MED ORDER — FENTANYL CITRATE PF 50 MCG/ML IJ SOSY: 25.0000 ug | PREFILLED_SYRINGE | INTRAMUSCULAR | Status: DC | PRN

## 2022-05-23 MED ORDER — ROCURONIUM BROMIDE 10 MG/ML (PF) SYRINGE
PREFILLED_SYRINGE | INTRAVENOUS | Status: AC
  Filled 2022-05-23: qty 10

## 2022-05-23 MED ORDER — DEXAMETHASONE SODIUM PHOSPHATE 10 MG/ML IJ SOLN
20.0000 mg | INTRAMUSCULAR | Status: DC
  Administered 2022-05-23 – 2022-05-24 (×2): 20 mg via INTRAVENOUS
  Filled 2022-05-23 (×2): qty 2

## 2022-05-23 MED ORDER — ACETAMINOPHEN 325 MG PO TABS: 650.0000 mg | ORAL_TABLET | ORAL | Status: DC | PRN

## 2022-05-23 MED ORDER — PROSOURCE TF20 ENFIT COMPATIBL EN LIQD
60.0000 mL | Freq: Every day | ENTERAL | Status: DC
  Administered 2022-05-23 – 2022-05-24 (×2): 60 mL
  Filled 2022-05-23 (×2): qty 60

## 2022-05-23 MED ORDER — FENTANYL CITRATE PF 50 MCG/ML IJ SOSY
50.0000 ug | PREFILLED_SYRINGE | Freq: Once | INTRAMUSCULAR | Status: AC
  Administered 2022-05-23: 50 ug via INTRAVENOUS

## 2022-05-23 MED ORDER — DOCUSATE SODIUM 50 MG/5ML PO LIQD
100.0000 mg | Freq: Two times a day (BID) | ORAL | Status: DC
  Administered 2022-05-23 – 2022-05-25 (×4): 100 mg
  Filled 2022-05-23 (×4): qty 10

## 2022-05-23 MED ORDER — FENTANYL CITRATE PF 50 MCG/ML IJ SOSY
25.0000 ug | PREFILLED_SYRINGE | INTRAMUSCULAR | Status: DC | PRN
  Administered 2022-05-23 (×3): 50 ug via INTRAVENOUS
  Administered 2022-05-24: 100 ug via INTRAVENOUS
  Administered 2022-05-24 (×4): 50 ug via INTRAVENOUS
  Administered 2022-05-24: 100 ug via INTRAVENOUS
  Administered 2022-05-24 – 2022-05-25 (×3): 50 ug via INTRAVENOUS
  Filled 2022-05-23: qty 1
  Filled 2022-05-23: qty 2
  Filled 2022-05-23 (×2): qty 1
  Filled 2022-05-23: qty 2
  Filled 2022-05-23: qty 1
  Filled 2022-05-23: qty 2
  Filled 2022-05-23: qty 1
  Filled 2022-05-23: qty 2
  Filled 2022-05-23 (×3): qty 1

## 2022-05-23 MED ORDER — FENTANYL CITRATE PF 50 MCG/ML IJ SOSY
PREFILLED_SYRINGE | INTRAMUSCULAR | Status: AC
  Filled 2022-05-23: qty 2

## 2022-05-23 MED ORDER — MIDAZOLAM HCL 2 MG/2ML IJ SOLN
2.0000 mg | Freq: Once | INTRAMUSCULAR | Status: AC
  Administered 2022-05-23: 2 mg via INTRAVENOUS

## 2022-05-23 MED ORDER — VANCOMYCIN HCL 1750 MG/350ML IV SOLN
1750.0000 mg | Freq: Once | INTRAVENOUS | Status: AC
  Administered 2022-05-23: 1750 mg via INTRAVENOUS
  Filled 2022-05-23: qty 350

## 2022-05-23 MED ORDER — FAMOTIDINE 20 MG PO TABS: 20.0000 mg | ORAL_TABLET | Freq: Two times a day (BID) | ORAL | Status: DC

## 2022-05-23 MED ORDER — CHLORHEXIDINE GLUCONATE CLOTH 2 % EX PADS
6.0000 | MEDICATED_PAD | Freq: Every day | CUTANEOUS | Status: DC
  Administered 2022-05-23 – 2022-05-27 (×5): 6 via TOPICAL

## 2022-05-23 MED ORDER — LACTATED RINGERS IV SOLN: INTRAVENOUS | Status: DC

## 2022-05-23 MED ORDER — ETOMIDATE 2 MG/ML IV SOLN
10.0000 mg | Freq: Once | INTRAVENOUS | Status: AC
  Administered 2022-05-23: 10 mg via INTRAVENOUS

## 2022-05-23 MED ORDER — PHENYLEPHRINE 80 MCG/ML (10ML) SYRINGE FOR IV PUSH (FOR BLOOD PRESSURE SUPPORT)
PREFILLED_SYRINGE | INTRAVENOUS | Status: AC
  Filled 2022-05-23: qty 10

## 2022-05-23 MED ORDER — DOCUSATE SODIUM 100 MG PO CAPS: 100.0000 mg | ORAL_CAPSULE | Freq: Two times a day (BID) | ORAL | Status: DC | PRN

## 2022-05-23 MED ORDER — FUROSEMIDE 10 MG/ML IJ SOLN
40.0000 mg | Freq: Once | INTRAMUSCULAR | Status: AC
  Administered 2022-05-23: 40 mg via INTRAVENOUS
  Filled 2022-05-23: qty 4

## 2022-05-23 MED ORDER — ROCURONIUM BROMIDE 10 MG/ML (PF) SYRINGE
50.0000 mg | PREFILLED_SYRINGE | Freq: Once | INTRAVENOUS | Status: AC
  Administered 2022-05-23: 50 mg via INTRAVENOUS

## 2022-05-23 MED ORDER — PANTOPRAZOLE SODIUM 40 MG IV SOLR
40.0000 mg | Freq: Every day | INTRAVENOUS | Status: DC
  Administered 2022-05-23 – 2022-05-28 (×6): 40 mg via INTRAVENOUS
  Filled 2022-05-23 (×6): qty 10

## 2022-05-23 MED ORDER — SODIUM CHLORIDE 0.9 % IV SOLN
2.0000 g | INTRAVENOUS | Status: DC
  Administered 2022-05-24 – 2022-05-26 (×3): 2 g via INTRAVENOUS
  Filled 2022-05-23 (×3): qty 12.5

## 2022-05-23 NOTE — Progress Notes (Signed)
A consult was received from an ED physician for Vancomycin and Cefepime per pharmacy dosing.  The patient's profile has been reviewed for ht/wt/allergies/indication/available labs.    A one time order has been placed for Vancomycin '1750mg'$  IV and Cefepime 2g IV.  Further antibiotics/pharmacy consults should be ordered by admitting physician if indicated.                       Thank you, Luiz Ochoa 05/21/2022  4:25 PM

## 2022-05-23 NOTE — Progress Notes (Signed)
Patient arrived to unit at around 1810. Patient was on Bipap with 70% FiO2 upon arrival, with noted accessory muscle use and respiratory rate in 60's. RT and CCM at bedside. CCM made the decision to emergently intubate at 1833. See MAR for meds given. Patient tolerated procedure well. CXR obtained to verify placement. Nightshift RN will continue to carefully monitor pt for changes in status.

## 2022-05-23 NOTE — Progress Notes (Signed)
Assisted with transporting PT from ED to ICU while on BiPAP- uneventful. ICU RT is aware PT has arrived. RN at bedside at this time.

## 2022-05-23 NOTE — H&P (Addendum)
NAME:  Samuel [name], MRN:  [id number], DOB:  12-23-1945, LOS: 0 ADMISSION DATE:  05/26/2022, CONSULTATION DATE:  05/17/2022 REFERRING MD:  Cyd Silence - TRH, CHIEF COMPLAINT:  resp distress   History of Present Illness:  76 yo M hx AAA, PAD, HTN recently admitted to [facility] 12/5-12/11 with multifocal PNA and hypoxia, of unclear etiology but received course of abx (rocephin, azithro), presented to [facility] ED 12/14 with worse SOB. CXR in ed shows significant progression of bilat infiltrates from prior, concerning for ARDS.  He was hypoxic and placed on BiPAP. Initially planned to be admitted to [facility] but quickly declined despite bipap. PCCM was called urgently for respiratory distress   Last admisison developed new Afib RVR, dc with eliquis Also dc with new O2 req RVP, Covid, flu, legionella, strep ag were neg for that admission.   Micro is pending for current presentation   In d/w wife, she notes that pt was handling raw deer meat before prior admission and seems like he has been declining since. Isnt sure if this might be related. She discussed intubation and code status with Dr. Cyd Silence and endorses full code / full scope offered treatment, which she also confirmed with me prior to intubation.    Pertinent  Medical History  AAA Afib HTN CAD  Significant Hospital Events: Including procedures, antibiotic start and stop dates in addition to other pertinent events   12/14 admitted on BiPAP, decompensated and required intubatopm  Interim History / Subjective:  Worse resp distress on BiPAP  Objective   Blood pressure (!) 162/60, pulse 69, resp. rate 15, height '5\' 10"'$  (1.778 m), weight 79.2 kg, SpO2 100 %.    Vent Mode: PRVC FiO2 (%):  [60 %-100 %] 100 % Set Rate:  [15 bmp] 15 bmp Vt Set:  [540 mL] 540 mL PEEP:  [10 cmH20] 10 cmH20 Plateau Pressure:  [25 cmH20] 25 cmH20   Intake/Output Summary (Last 24 hours) at 06/08/2022 1847 Last data filed at 06/08/2022 1841 Gross per 24 hour  Intake  997.37 ml  Output 0 ml  Net 997.37 ml   Filed Weights   05/13/2022 1443 05/27/2022 1814  Weight: 81 kg 79.2 kg    Examination: General: ill appearing elderly M with resp distress on BiPAP HENT: NCAT anicteric sclera Lungs: Increased RR. Using accessory muscles.  Cardiovascular: rr cap refill brisk  Abdomen: soft nontender  Extremities: no acute deformity.  BLE pitting edema Neuro: Awakens to stimulation but is not oriented GU: external urinary pouch   Resolved Hospital Problem list     Assessment & Plan:   Acute respiratory failure with hypoxia Likely ARDS  ?HCAP P -intubate now -lung protective ventilation, VAP, PAD. Might need high peep strategy  -CXR following, and ABG -AM Cxr as well  -awaiting RVP, Covid, Flu, Trach asp  -dont think great utility in resending legionella or strep ag given recent neg -vanc / cefepime -- dc vanc in mrsa neg  -lasix x1  -decadron -pct  Afib -- has been in and out of Afib / sinus so far P -will order for hep gtt -cardiac monitoring   AKI on CKD  NAGMA  -trend renal indices, UOP -de-escalate mIVF  HTN -holding antihypertensives while we equilibrate on sedation for MV   Acute on chronic anemia -trend cbc  Hx AAA Hx PVD P -outpt follow up   Best Practice (right click and "Reselect all SmartList Selections" daily)   Diet/type: NPO DVT prophylaxis: systemic heparin GI prophylaxis: PPI Lines:  N/A Foley:  N/A Code Status:  full code Last date of multidisciplinary goals of care discussion [12/14 -- wife, full code, full scope ]  Labs   CBC: Recent Labs  Lab 05/18/22 0419 05/19/22 0349 05/20/22 0345 05/17/2022 1520 05/22/2022 1831  WBC 7.4 7.9 10.0 12.3* 14.7*  NEUTROABS  --   --   --  9.8*  --   HGB 8.7* 9.6* 9.1* 8.5* 8.2*  HCT 26.3* 28.9* 27.3* 25.8* 24.8*  MCV 100.0 100.7* 98.2 97.7 100.4*  PLT 175 210 239 316 790    Basic Metabolic Panel: Recent Labs  Lab 05/18/22 0419 05/19/22 0349 05/20/22 0345  06/07/2022 1520  NA 139 137 138 137  K 3.8 3.8 4.3 4.3  CL 108 107 107 106  CO2 21* 21* 21* 20*  GLUCOSE 123* 106* 117* 124*  BUN 36* 31* 37* 76*  CREATININE 1.86* 1.40* 1.66* 2.45*  CALCIUM 8.7* 8.9 9.2 9.2  MG 1.9  --   --   --    GFR: Estimated Creatinine Clearance: 25.7 mL/min (A) (by C-G formula based on SCr of 2.45 mg/dL (H)). Recent Labs  Lab 05/19/22 0349 05/20/22 0345 05/19/2022 1520 05/29/2022 1713 05/25/2022 1831  WBC 7.9 10.0 12.3*  --  14.7*  LATICACIDVEN  --   --  1.4 1.2  --     Liver Function Tests: Recent Labs  Lab 05/27/2022 1520  AST 36  ALT 40  ALKPHOS 59  BILITOT 0.8  PROT 7.2  ALBUMIN 3.3*   No results for input(s): "LIPASE", "AMYLASE" in the last 168 hours. No results for input(s): "AMMONIA" in the last 168 hours.  ABG    Component Value Date/Time   PHART 7.36 06/07/2022 1809   PCO2ART 37 05/27/2022 1809   PO2ART 150 (H) 05/13/2022 1809   HCO3 20.9 05/20/2022 1809   TCO2 29 06/21/2019 1034   ACIDBASEDEF 4.0 (H) 05/21/2022 1809   O2SAT 95.7 05/17/2022 1809     Coagulation Profile: No results for input(s): "INR", "PROTIME" in the last 168 hours.  Cardiac Enzymes: No results for input(s): "CKTOTAL", "CKMB", "CKMBINDEX", "TROPONINI" in the last 168 hours.  HbA1C: Hgb A1c MFr Bld  Date/Time Value Ref Range Status  03/26/2012 08:14 AM 5.9 4.6 - 6.5 % Final    Comment:    Glycemic Control Guidelines for People with Diabetes:Non Diabetic:  <6%Goal of Therapy: <7%Additional Action Suggested:  >8%   08/19/2011 02:44 PM 6.0 4.6 - 6.5 % Final    Comment:    Glycemic Control Guidelines for People with Diabetes:Non Diabetic:  <6%Goal of Therapy: <7%Additional Action Suggested:  >8%     CBG: No results for input(s): "GLUCAP" in the last 168 hours.  Review of Systems:   Unable to obtain due to resp distress  Past Medical History:  He,  has a past medical history of AAA (abdominal aortic aneurysm) ([place]), Carotid artery occlusion, Carotid artery  stenosis (05/14/2022), Essential hypertension (10/16/2007), Genital warts, Glucose intolerance (05/14/2022), History of carotid stenosis, Hyperlipidemia, Hypertension, PAD (peripheral artery disease) ([place]), and Peripheral vascular disease ([place]) (10/16/2007).   Surgical History:   Past Surgical History:  Procedure Laterality Date   ABDOMINAL AORTIC ENDOVASCULAR STENT GRAFT N/A 11/19/2018   Procedure: ABDOMINAL AORTIC ENDOVASCULAR STENT GRAFT;  Surgeon: Waynetta Sandy, MD;  Location: [place];  Service: Vascular;  Laterality: N/A;   [place]  05/2005   right   COLONOSCOPY     [place]   Left  Leg stents  2012   VISCERAL ANGIOGRAPHY N/A 06/21/2019   Procedure: VISCERAL ANGIOGRAPHY;  Surgeon: Waynetta Sandy, MD;  Location: [place] CV [facility];  Service: Cardiovascular;  Laterality: N/A;   VISCERAL ARTERY INTERVENTION N/A 06/21/2019   Procedure: VISCERAL ARTERY INTERVENTION;  Surgeon: Waynetta Sandy, MD;  Location: [place] CV LAB;  Service: Cardiovascular;  Laterality: N/A;  Stent to SMA     Social History:   reports that he has quit smoking. He has never used smokeless tobacco. He reports current alcohol use of about 24.0 standard drinks of alcohol per week. He reports that he does not use drugs.   Family History:  His family history includes Hyperlipidemia in his father and mother; Peripheral vascular disease in his brother and father; Stroke in his mother. There is no history of Colon cancer, Esophageal cancer, Prostate cancer, Rectal cancer, or Stomach cancer.   Allergies Allergies  Allergen Reactions   Diclofenac Diarrhea   Lipitor [Atorvastatin] Other (See Comments)    Muscle pain   Livalo [Pitavastatin] Other (See Comments)    Muscle aches   Statins     Muscle aches     Home Medications  Prior to Admission medications   Medication Sig Start Date End Date Taking? Authorizing Provider  amLODipine  (NORVASC) 10 MG tablet Take 1 tablet (10 mg total) by mouth daily. 05/21/22   Kathie Dike, MD  amoxicillin-clavulanate (AUGMENTIN) 875-125 MG tablet Take 1 tablet by mouth 2 (two) times daily for 5 days. 05/18/22 05/18/2022  Barb Merino, MD  apixaban (ELIQUIS) 5 MG TABS tablet Take 1 tablet (5 mg total) by mouth 2 (two) times daily. 05/18/22 08/16/22  Barb Merino, MD  bisoprolol (ZEBETA) 10 MG tablet Take 1 tablet (10 mg total) by mouth daily. 05/18/22 06/17/22  Barb Merino, MD  Cholecalciferol (VITAMIN D3) 125 MCG (5000 UT) CHEW Chew 5,000 Units by mouth daily after breakfast.    [provider]  clopidogrel (PLAVIX) 75 MG tablet TAKE 1 TABLET BY MOUTH EVERY DAY Patient taking differently: Take 75 mg by mouth in the morning. 08/16/20   Waynetta Sandy, MD  fluocinonide cream (LIDEX) 7.25 % Apply 1 Application topically 2 (two) times daily as needed (to affected areas for itching/swelling).    [provider]  hydrALAZINE (APRESOLINE) 100 MG tablet Take 1 tablet (100 mg total) by mouth 3 (three) times daily. 05/18/22 06/17/22  Barb Merino, MD  ipratropium-albuterol (DUONEB) 0.5-2.5 (3) MG/3ML SOLN Take 3 mLs by nebulization every 6 (six) hours as needed. 05/20/22   Kathie Dike, MD  NON FORMULARY Take 1 capsule by mouth See admin instructions. Beet plus capsules- Take 1 capsule by mouth once a day    [provider]  NON FORMULARY Take 1 capsule by mouth See admin instructions. Juice Plus capsules- Take 1 capsule by mouth once a day    [provider]  sildenafil (VIAGRA) 100 MG tablet Take 100 mg by mouth daily as needed for erectile dysfunction (one hour prior to activity- max of 1 tablet/day).    [provider]  sodium chloride (MURO 128) 5 % ophthalmic solution Place 1 drop into both eyes 3 (three) times daily.    [provider]  tamsulosin (FLOMAX) 0.4 MG CAPS capsule Take 0.4 mg by mouth at bedtime.    [provider]      Critical care time: 40 min     CRITICAL CARE Performed by: Cristal Generous   Total critical care time:  40 minutes  Critical care time was exclusive of separately billable procedures and treating other patients.  Critical care was necessary to treat or prevent imminent or life-threatening deterioration.  Critical care was time spent personally by me on the following activities: development of treatment plan with patient and/or surrogate as well as nursing, discussions with consultants, evaluation of patient's response to treatment, examination of patient, obtaining history from patient or surrogate, ordering and performing treatments and interventions, ordering and review of laboratory studies, ordering and review of radiographic studies, pulse oximetry and re-evaluation of patient's condition.  Eliseo Gum MSN, AGACNP-BC [place] for pager  05/15/2022, 6:47 PM

## 2022-05-23 NOTE — Progress Notes (Signed)
ANTICOAGULATION CONSULT NOTE -  Consult  Pharmacy Consult for IV heparin Indication: atrial fibrillation  Allergies  Allergen Reactions   Diclofenac Diarrhea   Lipitor [Atorvastatin] Other (See Comments)    Muscle pain   Livalo [Pitavastatin] Other (See Comments)    Muscle aches   Statins     Muscle aches    Patient Measurements: Height: '5\' 10"'$  (177.8 cm) Weight: 79.2 kg (174 lb 9.7 oz) IBW/kg (Calculated) : 73 Heparin Dosing Weight:   Vital Signs: Temp: 95.9 F (35.5 C) (12/14 2030) Temp Source: Bladder (12/14 2000) BP: 127/49 (12/14 2030) Pulse Rate: 56 (12/14 2030)  Labs: Recent Labs    05/18/2022 1520 05/31/2022 1713 05/22/2022 1815 05/29/2022 1831  HGB 8.5*  --   --  8.2*  HCT 25.8*  --   --  24.8*  PLT 316  --   --  280  APTT  --   --  37*  --   LABPROT  --   --  26.2*  --   INR  --   --  2.4*  --   CREATININE 2.45*  --   --  2.53*  TROPONINIHS 66* 84*  --   --     Estimated Creatinine Clearance: 24.8 mL/min (A) (by C-G formula based on SCr of 2.53 mg/dL (H)).   Medical History: Past Medical History:  Diagnosis Date   AAA (abdominal aortic aneurysm) ([place])    Carotid artery occlusion    Carotid artery stenosis 05/14/2022   Jun 05, 2019 Entered By: Ria Clock Comment: Right carotid endarterectomy.   Essential hypertension 10/16/2007   Qualifier: Diagnosis of   By: Danelle Earthly CMA, Darlene       Genital warts    history of   Glucose intolerance 05/14/2022   History of carotid stenosis    s/p right carotid endarterectomy 12/06   Hyperlipidemia    Hypertension    PAD (peripheral artery disease) ([facility])    Left leg   Peripheral vascular disease ([place]) 10/16/2007   Centricity Description: INTERMITTENT CLAUDICATION, LEFT LEG  Qualifier: Diagnosis of   By: Danelle Earthly CMA, Darlene      Centricity Description: UNSPECIFIED PERIPHERAL VASCULAR DISEASE  Qualifier: Diagnosis of   By: Wynona Luna       Medications:  Medications Prior to Admission  Medication  Sig Dispense Refill Last Dose   amLODipine (NORVASC) 10 MG tablet Take 1 tablet (10 mg total) by mouth daily. 30 tablet 1 06/02/2022 at am   amoxicillin-clavulanate (AUGMENTIN) 875-125 MG tablet Take 1 tablet by mouth 2 (two) times daily for 5 days. 10 tablet 0 05/13/2022 at am   apixaban (ELIQUIS) 5 MG TABS tablet Take 1 tablet (5 mg total) by mouth 2 (two) times daily. 60 tablet 2 05/20/2022 at 1100   bisoprolol (ZEBETA) 10 MG tablet Take 1 tablet (10 mg total) by mouth daily. 30 tablet 0 06/02/2022 at am   Cholecalciferol (VITAMIN D3) 125 MCG (5000 UT) CHEW Chew 5,000 Units by mouth daily after breakfast.   05/26/2022   clopidogrel (PLAVIX) 75 MG tablet TAKE 1 TABLET BY MOUTH EVERY DAY (Patient taking differently: Take 75 mg by mouth in the morning.) 90 tablet 3 05/26/2022 at 1100   hydrALAZINE (APRESOLINE) 100 MG tablet Take 1 tablet (100 mg total) by mouth 3 (three) times daily. 90 tablet 0 06/07/2022 at am   ipratropium-albuterol (DUONEB) 0.5-2.5 (3) MG/3ML SOLN Take 3 mLs by nebulization every 6 (six) hours as needed. (Patient taking differently: Take 3  mLs by nebulization in the morning, at noon, and at bedtime.) 360 mL 0 05/18/2022 at am   NON FORMULARY Take 1 capsule by mouth See admin instructions. Beet plus capsules- Take 1 capsule by mouth once a day   05/30/2022   NON FORMULARY Take 1 capsule by mouth See admin instructions. Juice Plus capsules- Take 1 capsule by mouth once a day   06/01/2022   sodium chloride (MURO 128) 5 % ophthalmic solution Place 1 drop into both eyes See admin instructions. Instill 1 drop into both eyes in the morning, afternoon, and evening   06/03/2022 at am   tamsulosin (FLOMAX) 0.4 MG CAPS capsule Take 0.4 mg by mouth in the morning.   05/26/2022 at am   fluocinonide cream (LIDEX) 4.13 % Apply 1 Application topically 2 (two) times daily as needed (to affected areas for itching/swelling). (Patient not taking: Reported on 05/13/2022)   Not Taking   sildenafil  (VIAGRA) 100 MG tablet Take 100 mg by mouth daily as needed for erectile dysfunction (one hour prior to activity- max of 1 tablet/day). (Patient not taking: Reported on 05/22/2022)   Not Taking    Assessment: Pharmacy is consulted to start heparin drip on 76 yo male with PMH of atrial fibrillation. Pt is on apixaban 5 mg PO BID PTA, with last dose being at 1100 on 12/14 per med rec. Pt has been intubeated with ARDS.  Today, 06/06/2022  Hgb 8.2 ,low but stable Plt 280  Scr 2.53 mg/dl, CrCl 25 ml/min  Will initially monitor using aPTT due to recent use of apixaban    Goal of Therapy:  Heparin level 0.3-0.7 units/ml aPTT 66-102  Monitor platelets by anticoagulation protocol: Yes   Plan:  At 2300, start heparin drip 1200 units/hr  Obtain HL/aPTT 8 hours after start of infusion  Daily CBC while on heparin drip  Monitor for signs and symptoms of bleeding   Royetta Asal, PharmD, BCPS 05/18/2022 9:08 PM

## 2022-05-23 NOTE — ED Provider Notes (Signed)
[name] [name] Provider Note   CSN: [id number] Arrival date & time: 05/12/2022  1412     History  Chief Complaint  Patient presents with   Respiratory Distress    ASAHD CAN is a 76 y.o. male.  Patient as above with significant medical history as [name], [name] [name], [name] [name], [name] [name], [name] [name], [name] who presents to the ED with complaint of dib  Discharged 12/11 following new onset [name] [name], respiratory failure 2/2 pna. Sent home on 4L [place], feeling well until night before last, began to develop worsening DIB. Turned [place] up to 6L, on EMS arrival he was hypoxic in 60's, severe resp distress. Placed on NRB with improvement to 91%. Reports he felt better on discharge but then symptoms worsened. Productive cough with yellow/blood tinged sputum. No fevers or chills. Feels very dyspneic with minimal activity, unable to transition from bed to chair 2/2 severe dib. Chest tightness but no chest pain, no n/v/diarrhea or change to urination. Compliant with home meds; [name] eliquis which was started during recent admission     Past Medical History:  Diagnosis Date   [name] (abdominal aortic aneurysm) ([place])    Carotid artery occlusion    Carotid artery stenosis 05/14/2022   Jun 05, 2019 Entered By: Ria Clock Comment: Right carotid endarterectomy.   Essential hypertension 10/16/2007   Qualifier: Diagnosis of   By: Danelle Earthly CMA, Darlene       Genital warts    history of   Glucose intolerance 05/14/2022   History of carotid stenosis    s/p right carotid endarterectomy 12/06   Hyperlipidemia    Hypertension    PAD (peripheral artery disease) ([place])    Left leg   Peripheral vascular disease ([place]) 10/16/2007   Centricity Description: INTERMITTENT CLAUDICATION, LEFT LEG  Qualifier: Diagnosis of   By: Danelle Earthly CMA, Darlene      Centricity Description: UNSPECIFIED PERIPHERAL VASCULAR DISEASE  Qualifier: Diagnosis of   By: Wynona Luna       Past Surgical History:   Procedure Laterality Date   ABDOMINAL AORTIC ENDOVASCULAR STENT GRAFT N/A 11/19/2018   Procedure: ABDOMINAL AORTIC ENDOVASCULAR STENT GRAFT;  Surgeon: Waynetta Sandy, MD;  Location: [facility];  Service: Vascular;  Laterality: N/A;   [place]  05/2005   right   COLONOSCOPY     KNEE SURGERY  1962   Left Leg stents  2012   VISCERAL ANGIOGRAPHY N/A 06/21/2019   Procedure: VISCERAL ANGIOGRAPHY;  Surgeon: Waynetta Sandy, MD;  Location: [facility];  Service: Cardiovascular;  Laterality: N/A;   VISCERAL ARTERY INTERVENTION N/A 06/21/2019   Procedure: VISCERAL ARTERY INTERVENTION;  Surgeon: Waynetta Sandy, MD;  Location: [facility];  Service: Cardiovascular;  Laterality: N/A;  Stent to SMA     The history is provided by the patient. No language interpreter was used.       Home Medications Prior to Admission medications   Medication Sig Start Date End Date Taking? Authorizing Provider  amLODipine (NORVASC) 10 MG tablet Take 1 tablet (10 mg total) by mouth daily. 05/21/22   Kathie Dike, MD  amoxicillin-clavulanate (AUGMENTIN) 875-125 MG tablet Take 1 tablet by mouth 2 (two) times daily for 5 days. 05/18/22 06/09/2022  Barb Merino, MD  apixaban (ELIQUIS) 5 MG TABS tablet Take 1 tablet (5 mg total) by mouth 2 (two) times daily. 05/18/22 08/16/22  Barb Merino, MD  bisoprolol (ZEBETA) 10 MG tablet Take 1 tablet (10  mg total) by mouth daily. 05/18/22 06/17/22  Barb Merino, MD  Cholecalciferol (VITAMIN D3) 125 MCG (5000 UT) CHEW Chew 5,000 Units by mouth daily after breakfast.    [provider]  clopidogrel (PLAVIX) 75 MG tablet TAKE 1 TABLET BY MOUTH EVERY DAY Patient taking differently: Take 75 mg by mouth in the morning. 08/16/20   Waynetta Sandy, MD  fluocinonide cream (LIDEX) 2.22 % Apply 1 Application topically 2 (two) times daily as needed (to affected areas for itching/swelling).    [provider]  hydrALAZINE (APRESOLINE) 100 MG tablet Take 1 tablet (100 mg total) by mouth 3 (three) times daily. 05/18/22 06/17/22  Barb Merino, MD  ipratropium-albuterol (DUONEB) 0.5-2.5 (3) MG/3ML SOLN Take 3 mLs by nebulization every 6 (six) hours as needed. 05/20/22   Kathie Dike, MD  NON FORMULARY Take 1 capsule by mouth See admin instructions. Beet plus capsules- Take 1 capsule by mouth once a day    [provider]  NON FORMULARY Take 1 capsule by mouth See admin instructions. Juice Plus capsules- Take 1 capsule by mouth once a day    [provider]  sildenafil (VIAGRA) 100 MG tablet Take 100 mg by mouth daily as needed for erectile dysfunction (one hour prior to activity- max of 1 tablet/day).    [provider]  sodium chloride (MURO 128) 5 % ophthalmic solution Place 1 drop into both eyes 3 (three) times daily.    [provider]  tamsulosin (FLOMAX) 0.4 MG CAPS capsule Take 0.4 mg by mouth at bedtime.    [provider]      Allergies    Diclofenac, Lipitor [atorvastatin], Livalo [pitavastatin], and Statins    Review of Systems   Review of Systems  Constitutional:  Positive for fatigue. Negative for chills and fever.  HENT:  Negative for facial swelling and trouble swallowing.   Eyes:  Negative for photophobia and visual disturbance.  Respiratory:  Positive for cough, chest tightness and shortness of breath.   Cardiovascular:  Negative for chest pain and palpitations.  Gastrointestinal:  Negative for abdominal pain, nausea and vomiting.  Endocrine: Negative for polydipsia and polyuria.  Genitourinary:  Negative for difficulty urinating and hematuria.  Musculoskeletal:  Negative for gait problem and joint swelling.  Skin:  Negative for pallor and rash.  Neurological:  Negative for syncope and headaches.  Psychiatric/Behavioral:  Negative for agitation and confusion.     Physical Exam Updated Vital Signs BP (!) 157/69    Pulse 71   Resp (!) 45   Ht '5\' 10"'$  (1.778 m)   Wt 81 kg   SpO2 95%   BMI 25.62 kg/m  Physical Exam Vitals and nursing note reviewed.  Constitutional:      General: He is in acute distress.     Appearance: Normal appearance. He is well-developed.  HENT:     Head: Normocephalic and atraumatic.     Right Ear: External ear normal.     Left Ear: External ear normal.     Mouth/Throat:     Mouth: Mucous membranes are moist.  Eyes:     General: No scleral icterus. Cardiovascular:     Rate and Rhythm: Regular rhythm. Tachycardia present.     Pulses: Normal pulses.     Heart sounds: Normal heart sounds.  Pulmonary:     Effort: Respiratory distress present.     Breath sounds: No stridor. Decreased breath sounds present.     Comments: Adventitious breath sounds b/l Abdominal:  General: Abdomen is flat.     Palpations: Abdomen is soft.     Tenderness: There is no abdominal tenderness. There is no guarding or rebound.  Musculoskeletal:        General: Normal range of motion.     Cervical back: Normal range of motion.     Right lower leg: No edema.     Left lower leg: No edema.  Skin:    General: Skin is warm and dry.     Capillary Refill: Capillary refill takes less than 2 seconds.  Neurological:     Mental Status: He is alert and oriented to person, place, and time.     GCS: GCS eye subscore is 4. GCS verbal subscore is 5. GCS motor subscore is 6.  Psychiatric:        Mood and Affect: Mood normal.        Behavior: Behavior normal.     ED Results / Procedures / Treatments   Labs (all labs ordered are listed, but only abnormal results are displayed) Labs Reviewed  COMPREHENSIVE METABOLIC PANEL - Abnormal; Notable for the following components:      Result Value   CO2 20 (*)    Glucose, Bld 124 (*)    BUN 76 (*)    Creatinine, Ser 2.45 (*)    Albumin 3.3 (*)    GFR, Estimated 26 (*)    All other components within normal limits  CBC WITH DIFFERENTIAL/PLATELET -  Abnormal; Notable for the following components:   WBC 12.3 (*)    RBC 2.64 (*)    Hemoglobin 8.5 (*)    HCT 25.8 (*)    Neutro Abs 9.8 (*)    Monocytes Absolute 1.3 (*)    Abs Immature Granulocytes 0.13 (*)    All other components within normal limits  BLOOD GAS, VENOUS - Abnormal; Notable for the following components:   pCO2, Ven 33 (*)    pO2, Ven 72 (*)    Acid-base deficit 2.9 (*)    All other components within normal limits  TROPONIN I (HIGH SENSITIVITY) - Abnormal; Notable for the following components:   Troponin I (High Sensitivity) 66 (*)    All other components within normal limits  RESP PANEL BY RT-PCR (RSV, FLU A&B, COVID)  RVPGX2  CULTURE, BLOOD (ROUTINE X 2)  CULTURE, BLOOD (ROUTINE X 2)  LACTIC ACID, PLASMA  BRAIN NATRIURETIC PEPTIDE  LACTIC ACID, PLASMA  PROTIME-INR  APTT  URINALYSIS, ROUTINE W REFLEX MICROSCOPIC  I-STAT VENOUS BLOOD GAS, ED  TROPONIN I (HIGH SENSITIVITY)    EKG EKG Interpretation  Date/Time:  Thursday May 23 2022 16:42:24 EST Ventricular Rate:  67 PR Interval:  144 QRS Duration: 96 QT Interval:  407 QTC Calculation: 430 R Axis:   54 Text Interpretation: Sinus rhythm Probable LVH with secondary repol abnrm ST depression, consider ischemia, diffuse lds similar to prev 12/7 Interpretation limited secondary to artifact Confirmed by Wynona Dove ([id number]) on 05/16/2022 4:53:09 PM  Radiology DG Chest Port 1 View  Result Date: 05/17/2022 CLINICAL DATA:  Hypoxia.  Recently treated for pneumonia. EXAM: PORTABLE CHEST 1 VIEW COMPARISON:  Chest radiograph dated 05/20/2022 FINDINGS: Increased bilateral airspace opacities. Normal lung volumes. Trace bilateral pleural effusions. No pneumothorax. Similar cardiomediastinal silhouette. The visualized skeletal structures are unremarkable. IMPRESSION: 1. Increased bilateral airspace opacities, suspicious for multifocal infection. 2. Trace bilateral pleural effusions. Electronically Signed   By: Darrin Nipper  M.D.   On: 05/10/2022 15:51    Procedures .Critical Care  Performed by: Jeanell Sparrow, DO Authorized by: Jeanell Sparrow, DO   Critical care provider statement:    Critical care time (minutes):  50   Critical care time was exclusive of:  Separately billable procedures and treating other patients   Critical care was necessary to treat or prevent imminent or life-threatening deterioration of the following conditions:  Renal failure, respiratory failure and sepsis   Critical care was time spent personally by me on the following activities:  Development of treatment plan with patient or surrogate, discussions with consultants, evaluation of patient's response to treatment, examination of patient, ordering and review of laboratory studies, ordering and review of radiographic studies, ordering and performing treatments and interventions, pulse oximetry, re-evaluation of patient's condition, review of old charts and obtaining history from patient or surrogate   Care discussed with: admitting provider       Medications Ordered in ED Medications  ceFEPIme (MAXIPIME) 2 g in sodium chloride 0.9 % 100 mL IVPB (has no administration in time range)  vancomycin (VANCOREADY) IVPB 1750 mg/350 mL (has no administration in time range)  LORazepam (ATIVAN) injection 1 mg (has no administration in time range)  lactated ringers bolus 500 mL (has no administration in time range)  lactated ringers bolus 500 mL (500 mLs Intravenous New Bag/Given 05/22/2022 1533)  LORazepam (ATIVAN) injection 1 mg (1 mg Intravenous Given 05/22/2022 1534)    ED Course/ Medical Decision Making/ A&P                           Medical Decision Making Amount and/or Complexity of Data Reviewed Labs: ordered. Radiology: ordered.  Risk Prescription drug management. Decision regarding hospitalization.   This patient presents to the ED with chief complaint(s) of dib with pertinent past medical history of as above which further  complicates the presenting complaint. The complaint involves an extensive differential diagnosis and also carries with it a high risk of complications and morbidity.     In my evaluation of this patient's dyspnea my DDx includes, but is not limited to, pneumonia, pulmonary embolism, pneumothorax, pulmonary edema, metabolic acidosis, asthma, COPD, cardiac cause, anemia, anxiety, etc.  . Serious etiologies were considered.   On NRB with best pulse ox 91%, will desaturate w/ minimal activity, will transition to bipap. He is claustrophobic, will give dose ativan    Additional history obtained: Additional history obtained from  na Records reviewed previous admission documents and prior labs/imaging/home meds  Independent labs interpretation:  The following labs were independently interpreted:  VBG stable, pH wnl Cmp with worsened renal fxn, today is 2.45, baseline around 1.4-1.8; was 1.6 3 days ago WBC 12.3 Hgb 8.5 - prev was around 9 Trop 66 today, EKG stable, trop 348 8 days ago; favor demand ischemia in setting of resp distress  Independent visualization of imaging: - I independently visualized the following imaging with scope of interpretation limited to determining acute life threatening conditions related to emergency care: CXR, which revealed PNA   Cardiac monitoring was reviewed and interpreted by myself which shows NSR   Treatment and Reassessment: BIPAP IVF Ativan Vanc/cefepime Code sepsis >> improved  Consultation: - Consulted or discussed management/test interpretation w/ external professional: na  Consideration for admission or further workup: Admission was considered    Pt here with resp distress, found to have worsening pneumonia requiring BIPAP, hypoxic on NRB. CXR c/w multifocal PNA vs ARDS. Abx started. Gentle IV rehydration in process. D/w [facility] who accepts pt  for admission.    Social Determinants of health: Social History   Tobacco Use   Smoking status:  Former   Smokeless tobacco: Never   Tobacco comments:    quit- for porlonged periods he has smoked 2-3 packs a day  Vaping Use   Vaping Use: Never used  Substance Use Topics   Alcohol use: Yes    Alcohol/week: 24.0 standard drinks of alcohol    Types: 24 Cans of beer per week   Drug use: No            Final Clinical Impression(s) / ED Diagnoses Final diagnoses:  Acute respiratory failure with hypoxia ([place])  Sepsis with acute hypoxic respiratory failure without septic shock, due to unspecified organism [facility])  AKI (acute kidney injury) ([place])  Multifocal pneumonia    Rx / DC Orders ED Discharge Orders     None         Jeanell Sparrow, DO 05/17/2022 1705

## 2022-05-23 NOTE — Progress Notes (Signed)
Cellphone and watch will be taken home by wife. Will continue to monitor.

## 2022-05-23 NOTE — Procedures (Signed)
Intubation Procedure Note  Samuel [name]  [id number]  [birth year]  Date:05/20/2022  Time:6:31 PM   Provider Performing:Cristabel Bicknell A Erskine Steinfeldt    Procedure: Intubation ([address])  Indication(s) Respiratory Failure  Consent Unable to obtain consent due to emergent nature of procedure.   Anesthesia Etomidate, Versed, Fentanyl, and Rocuronium Etomidate '10mg'$ , fentanyl 50, versed '2mg'$ , rocuronium 50  Time Out Verified patient identification, verified procedure, site/side was marked, verified correct patient position, special equipment/implants available, medications/allergies/relevant history reviewed, required imaging and test results available.   Sterile Technique Usual hand hygeine, masks, and gloves were used   Procedure Description Patient positioned in bed supine.  Sedation given as noted above.  Patient was intubated with endotracheal tube using Glidescope.  View was Grade 1 full glottis .  Number of attempts was 1.  Colorimetric CO2 detector was consistent with tracheal placement.   Complications/Tolerance None; patient tolerated the procedure well. Chest X-ray is ordered to verify placement.   EBL none   Specimen(s) None

## 2022-05-23 NOTE — ED Triage Notes (Signed)
Per EMS- Patient is from home and was on home O2 6L/min via [place] and sats were 70% upon EMS arrival. Patient was placed on a NRB and sats improved to 88%.  Patient was discharged from the hospital 3 days ago and had been hospitalized 8 days for pneumonia.  EMS witnessed SOB with exertion, a productive cough with bloody sputum and clots.

## 2022-05-23 NOTE — Progress Notes (Signed)
[facility] Progress Note Patient Name: BALRAJ BRAYFIELD DOB: 05/06/1946 MRN: [id number]   Date of Service  05/12/2022  HPI/Events of Note  Patient is intubated and needs an order for bilateral soft wrist restraints to prevent self-extubation.  eICU Interventions  Bilateral wrist restraints ordered.        Kerry Kass Manmeet Arzola 06/04/2022, 11:37 PM

## 2022-05-23 NOTE — Progress Notes (Signed)
Notified bedside nurse of need to administer antibiotics.  

## 2022-05-23 NOTE — Procedures (Signed)
OG tube placed uneventfully

## 2022-05-23 NOTE — Progress Notes (Signed)
Elink following code sepsis °

## 2022-05-23 NOTE — ED Notes (Signed)
Hospitalist explained to the wife that the patient may need intubation.

## 2022-05-23 NOTE — Progress Notes (Signed)
Pharmacy Antibiotic Note  Samuel [name] is a 76 y.o. male admitted on 05/19/2022 with pneumonia.  Pharmacy has been consulted for vancomycin and cefepime dosing.  Plan: Cefepime 2 gr IV q24h  Vancomycin 1750 mg IV x1 then vancomycin 750 mg IV q24h  ( AUC 525, Scr 2.53, TBW 79.2 kg )  Monitor clinical course, renal function, cultures as available   Height: '5\' 10"'$  (177.8 cm) Weight: 79.2 kg (174 lb 9.7 oz) IBW/kg (Calculated) : 73  Temp (24hrs), Avg:96.7 F (35.9 C), Min:96.7 F (35.9 C), Max:96.7 F (35.9 C)  Recent Labs  Lab 05/18/22 0419 05/19/22 0349 05/20/22 0345 05/16/2022 1520 05/25/2022 1713 06/03/2022 1831  WBC 7.4 7.9 10.0 12.3*  --  14.7*  CREATININE 1.86* 1.40* 1.66* 2.45*  --  2.53*  LATICACIDVEN  --   --   --  1.4 1.2  --     Estimated Creatinine Clearance: 24.8 mL/min (A) (by C-G formula based on SCr of 2.53 mg/dL (H)).    Allergies  Allergen Reactions   Diclofenac Diarrhea   Lipitor [Atorvastatin] Other (See Comments)    Muscle pain   Livalo [Pitavastatin] Other (See Comments)    Muscle aches   Statins     Muscle aches    Antimicrobials this admission: 12/14 vancomycin >>  12/14 cefepime >>   Dose adjustments this admission:   Microbiology results: 12/14 BCx:  12/14 resp culture:   12/14 MRSA PCR:  12/14 resp panel:   Thank you for allowing pharmacy to be a part of this patient's care.   Royetta Asal, PharmD, BCPS 06/07/2022 8:22 PM

## 2022-05-24 ENCOUNTER — Inpatient Hospital Stay (HOSPITAL_COMMUNITY): Payer: Medicare Other

## 2022-05-24 LAB — BLOOD GAS, ARTERIAL
Acid-base deficit: 4.5 mmol/L — ABNORMAL HIGH (ref 0.0–2.0)
Bicarbonate: 20.3 mmol/L (ref 20.0–28.0)
Drawn by: 422461
FIO2: 60 %
MECHVT: 540 mL
O2 Saturation: 97.1 %
PEEP: 10 cmH2O
Patient temperature: 37.3
RATE: 15 resp/min
pCO2 arterial: 36 mmHg (ref 32–48)
pH, Arterial: 7.36 (ref 7.35–7.45)
pO2, Arterial: 106 mmHg (ref 83–108)

## 2022-05-24 LAB — BASIC METABOLIC PANEL
Anion gap: 14 (ref 5–15)
Anion gap: 13 (ref 5–15)
BUN: 93 mg/dL — ABNORMAL HIGH (ref 8–23)
BUN: 108 mg/dL — ABNORMAL HIGH (ref 8–23)
CO2: 19 mmol/L — ABNORMAL LOW (ref 22–32)
CO2: 17 mmol/L — ABNORMAL LOW (ref 22–32)
Calcium: 9 mg/dL (ref 8.9–10.3)
Calcium: 8.3 mg/dL — ABNORMAL LOW (ref 8.9–10.3)
Chloride: 108 mmol/L (ref 98–111)
Chloride: 109 mmol/L (ref 98–111)
Creatinine, Ser: 3.33 mg/dL — ABNORMAL HIGH (ref 0.61–1.24)
Creatinine, Ser: 3.61 mg/dL — ABNORMAL HIGH (ref 0.61–1.24)
GFR, Estimated: 18 mL/min — ABNORMAL LOW (ref >60)
GFR, Estimated: 17 mL/min — ABNORMAL LOW (ref >60)
Glucose, Bld: 151 mg/dL — ABNORMAL HIGH (ref 70–99)
Glucose, Bld: 139 mg/dL — ABNORMAL HIGH (ref 70–99)
Potassium: 5.3 mmol/L — ABNORMAL HIGH (ref 3.5–5.1)
Potassium: 5.1 mmol/L (ref 3.5–5.1)
Sodium: 141 mmol/L (ref 135–145)
Sodium: 139 mmol/L (ref 135–145)

## 2022-05-24 LAB — GLUCOSE, CAPILLARY
Glucose-Capillary: 137 mg/dL — ABNORMAL HIGH (ref 70–99)
Glucose-Capillary: 151 mg/dL — ABNORMAL HIGH (ref 70–99)
Glucose-Capillary: 163 mg/dL — ABNORMAL HIGH (ref 70–99)
Glucose-Capillary: 165 mg/dL — ABNORMAL HIGH (ref 70–99)
Glucose-Capillary: 169 mg/dL — ABNORMAL HIGH (ref 70–99)
Glucose-Capillary: 169 mg/dL — ABNORMAL HIGH (ref 70–99)

## 2022-05-24 LAB — URINALYSIS, ROUTINE W REFLEX MICROSCOPIC
Bilirubin Urine: NEGATIVE
Glucose, UA: NEGATIVE mg/dL
Hgb urine dipstick: NEGATIVE
Ketones, ur: NEGATIVE mg/dL
Leukocytes,Ua: NEGATIVE
Nitrite: NEGATIVE
Protein, ur: NEGATIVE mg/dL
Specific Gravity, Urine: 1.012 (ref 1.005–1.030)
pH: 5 (ref 5.0–8.0)

## 2022-05-24 LAB — CBC
HCT: 22.5 % — ABNORMAL LOW (ref 39.0–52.0)
Hemoglobin: 7.1 g/dL — ABNORMAL LOW (ref 13.0–17.0)
MCH: 32.3 pg (ref 26.0–34.0)
MCHC: 31.6 g/dL (ref 30.0–36.0)
MCV: 102.3 fL — ABNORMAL HIGH (ref 80.0–100.0)
Platelets: 246 10*3/uL (ref 150–400)
RBC: 2.2 MIL/uL — ABNORMAL LOW (ref 4.22–5.81)
RDW: 13.3 % (ref 11.5–15.5)
WBC: 10.8 10*3/uL — ABNORMAL HIGH (ref 4.0–10.5)
nRBC: 0 % (ref 0.0–0.2)

## 2022-05-24 LAB — RESPIRATORY PANEL BY PCR

## 2022-05-24 LAB — APTT
aPTT: 60 seconds — ABNORMAL HIGH (ref 24–36)
aPTT: 72 seconds — ABNORMAL HIGH (ref 24–36)

## 2022-05-24 LAB — MAGNESIUM
Magnesium: 2.5 mg/dL — ABNORMAL HIGH (ref 1.7–2.4)
Magnesium: 2.8 mg/dL — ABNORMAL HIGH (ref 1.7–2.4)

## 2022-05-24 LAB — PREPARE RBC (CROSSMATCH)

## 2022-05-24 LAB — PHOSPHORUS
Phosphorus: 7.1 mg/dL — ABNORMAL HIGH (ref 2.5–4.6)
Phosphorus: 8.1 mg/dL — ABNORMAL HIGH (ref 2.5–4.6)

## 2022-05-24 LAB — HEPARIN LEVEL (UNFRACTIONATED): Heparin Unfractionated: >1.1 IU/mL — ABNORMAL HIGH (ref 0.30–0.70)

## 2022-05-24 LAB — TRIGLYCERIDES: Triglycerides: 86 mg/dL (ref <150)

## 2022-05-24 LAB — HEMOGLOBIN AND HEMATOCRIT, BLOOD
HCT: 26.6 % — ABNORMAL LOW (ref 39.0–52.0)
Hemoglobin: 8.5 g/dL — ABNORMAL LOW (ref 13.0–17.0)

## 2022-05-24 MED ORDER — LACTATED RINGERS IV SOLN: INTRAVENOUS | Status: DC

## 2022-05-24 MED ORDER — PIVOT 1.5 CAL PO LIQD
1000.0000 mL | ORAL | Status: DC
  Administered 2022-05-24: 1000 mL
  Filled 2022-05-24 (×2): qty 1000

## 2022-05-24 MED ORDER — SODIUM CHLORIDE 0.9 % IV SOLN: INTRAVENOUS | Status: DC | PRN

## 2022-05-24 MED ORDER — BUMETANIDE 0.25 MG/ML IJ SOLN
1.0000 mg | Freq: Once | INTRAMUSCULAR | Status: AC
  Administered 2022-05-24: 1 mg via INTRAVENOUS
  Filled 2022-05-24: qty 10

## 2022-05-24 MED ORDER — SODIUM CHLORIDE 0.9% IV SOLUTION: Freq: Once | INTRAVENOUS | Status: AC

## 2022-05-24 MED ORDER — LACTATED RINGERS IV BOLUS
250.0000 mL | Freq: Once | INTRAVENOUS | Status: AC
  Administered 2022-05-24: 250 mL via INTRAVENOUS

## 2022-05-24 MED ORDER — SODIUM ZIRCONIUM CYCLOSILICATE 5 G PO PACK
5.0000 g | PACK | Freq: Once | ORAL | Status: AC
  Administered 2022-05-24: 5 g
  Filled 2022-05-24: qty 1

## 2022-05-24 MED ORDER — POLYETHYLENE GLYCOL 3350 17 G PO PACK: 17.0000 g | PACK | Freq: Every day | ORAL | Status: DC | PRN

## 2022-05-24 MED ORDER — ORAL CARE MOUTH RINSE
15.0000 mL | OROMUCOSAL | Status: DC
  Administered 2022-05-24 – 2022-05-26 (×26): 15 mL via OROMUCOSAL

## 2022-05-24 NOTE — Progress Notes (Signed)
ANTICOAGULATION CONSULT NOTE - Follow Up Consult  Pharmacy Consult for Heparin + Vancomycin Indication: atrial fibrillation + HCAP  Allergies  Allergen Reactions   Diclofenac Diarrhea   Lipitor [Atorvastatin] Other (See Comments)    Muscle pain   Livalo [Pitavastatin] Other (See Comments)    Muscle aches   Statins     Muscle aches    Patient Measurements: Height: '5\' 10"'$  (177.8 cm) Weight: 79.2 kg (174 lb 9.7 oz) IBW/kg (Calculated) : 73 Heparin Dosing Weight: 79.2 kg  Vital Signs: Temp: 99.5 F (37.5 C) (12/15 0800) Temp Source: Bladder (12/15 0800) BP: 120/47 (12/15 0800) Pulse Rate: 68 (12/15 0800)  Labs: Recent Labs    05/20/2022 1520 06/03/2022 1713 05/27/2022 1815 06/01/2022 1831 05/24/22 0231 05/24/22 0805  HGB 8.5*  --   --  8.2* 7.1*  --   HCT 25.8*  --   --  24.8* 22.5*  --   PLT 316  --   --  280 246  --   APTT  --   --  37*  --   --  60*  LABPROT  --   --  26.2*  --   --   --   INR  --   --  2.4*  --   --   --   HEPARINUNFRC  --   --   --   --   --  >1.10*  CREATININE 2.45*  --   --  2.53* 3.33*  --   TROPONINIHS 66* 84*  --   --   --   --     Estimated Creatinine Clearance: 18.9 mL/min (A) (by C-G formula based on SCr of 3.33 mg/dL (H)).   Assessment: AC/Heme: Eliquis PTA (LD 12/14 mid-day) for h/o afib>>IV heparin.  - Hgb 8.2>7.1, Plts WNL - Hep level >1.1 as expected on Eliquis - aPTT 60 slightly low  ID: HCAP  - LA 1.4>1.2, PC 0.17 - Temp 99.5. WBC 10.8, Scr 3.33  Antimicrobials this admission: 12/14 vancomycin >>  12/14 cefepime >>   Dose adjustments this admission: - 12/14: Vancomycin 1750 mg IV x1 then vancomycin 750 mg IV q24h ( AUC 525, Scr 2.53, TBW 79.2 kg )  - 12/15: Vanco '750mg'$ /48h, AUC 471, Scr 3.33  Microbiology results: 12/14 BCx:  12/14 resp culture:   12/14 MRSA PCR: neg 12/14 resp panel:  12/14: Flu,COVID,RSV: neg  Goal of Therapy:  aPTT 66-102 seconds Monitor platelets by anticoagulation protocol: Yes   Plan:  -  Cefepime 2 gr IV q24h  - Change Vanco '750mg'$ /48h, AUC 471, Scr 3.33   Eliquis on hold Increase IV heparin to 1350 units/hr Recheck aPTT in 6 hours. Daily HL, aPTT, and CBC    Emeterio Balke S. Alford Highland, PharmD, BCPS Clinical Staff Pharmacist [email or web address] Alford Highland, [place] 05/24/2022,9:04 AM

## 2022-05-24 NOTE — Progress Notes (Addendum)
NAME:  Samuel [name], MRN:  [id number], DOB:  [birth year], LOS: 1 ADMISSION DATE:  05/19/2022, CONSULTATION DATE: 06/01/2022 REFERRING MD: Dr. Cyd Silence, CHIEF COMPLAINT: Respiratory distress  History of Present Illness:  76 yo M hx AAA, PAD, HTN recently admitted to [facility] 12/5-12/11 with multifocal PNA and hypoxia, of unclear etiology but received course of abx (rocephin, azithro), presented to [facility] ED 12/14 with worse SOB. CXR in ed shows significant progression of bilat infiltrates from prior, concerning for ARDS.  He was hypoxic and placed on BiPAP. Initially planned to be admitted to [facility] but quickly declined despite bipap. PCCM was called urgently for respiratory distress    Last admisison developed new Afib RVR, dc with eliquis Also dc with new O2 req RVP, Covid, flu, legionella, strep ag were neg for that admission.    Micro is pending for current presentation    In d/w wife, she notes that pt was handling raw deer meat before prior admission and seems like he has been declining since. Isnt sure if this might be related. She discussed intubation and code status with Dr. Cyd Silence and endorses full code / full scope offered treatment, which she also confirmed with me prior to intubation.    Pertinent  Medical History   Past Medical History:  Diagnosis Date   AAA (abdominal aortic aneurysm) ([place])    Carotid artery occlusion    Carotid artery stenosis 05/14/2022   Jun 05, 2019 Entered By: Ria Clock Comment: Right carotid endarterectomy.   Essential hypertension 10/16/2007   Qualifier: Diagnosis of   By: Danelle Earthly CMA, Darlene       Genital warts    history of   Glucose intolerance 05/14/2022   History of carotid stenosis    s/p right carotid endarterectomy 12/06   Hyperlipidemia    Hypertension    PAD (peripheral artery disease) ([place])    Left leg   Peripheral vascular disease ([place]) 10/16/2007   Centricity Description: INTERMITTENT CLAUDICATION, LEFT LEG  Qualifier:  Diagnosis of   By: Danelle Earthly CMA, Darlene      Centricity Description: UNSPECIFIED PERIPHERAL VASCULAR DISEASE  Qualifier: Diagnosis of   By: Bertell Maria Events: Including procedures, antibiotic start and stop dates in addition to other pertinent events   Chest x-ray with extensive bilateral infiltrate Recent CT scan with extensive bibasilar infiltrate, pleural effusions  Interim History / Subjective:  No overnight events Sedated  Objective   Blood pressure (!) 120/47, pulse 68, temperature 99.5 F (37.5 C), temperature source Bladder, resp. rate (!) 23, height '5\' 10"'$  (1.778 m), weight 79.2 kg, SpO2 96 %.    Vent Mode: PRVC FiO2 (%):  [40 %-100 %] 40 % Set Rate:  [15 bmp] 15 bmp Vt Set:  [540 mL] 540 mL PEEP:  [10 cmH20] 10 cmH20 Plateau Pressure:  [24 cmH20-28 cmH20] 25 cmH20   Intake/Output Summary (Last 24 hours) at 05/24/2022 0865 Last data filed at 05/24/2022 0802 Gross per 24 hour  Intake 1679.63 ml  Output 520 ml  Net 1159.63 ml   Filed Weights   06/03/2022 1443 05/16/2022 1814  Weight: 81 kg 79.2 kg   Examination: General: Ill-appearing gentleman, comfortable on vent at present HENT: Endotracheal tube in place, moist oral mucosa Lungs: Rales at the bases bilaterally Cardiovascular: S1-S2 appreciated Abdomen: Soft, bowel sounds appreciated Extremities: Bilateral lower extremity edema Neuro: Is arousable, follows commands GU:   Recent echocardiogram 78/09/6960 with diastolic dysfunction grade 2  Resolved  Hospital Problem list     Assessment & Plan:  Acute respiratory failure with hypoxia ARDS Possible H CAP -Continue full ventilator support -Low tidal volumes, PEEP as needed -Continue antibiotic therapy including. -Tracheal aspirate for Gram stain and cultures  Acute kidney injury on chronic kidney disease -Avoid nephrotoxic agents -Trend electrolytes -No acute indication for renal replacement therapy but will continue to  monitor closely  Anemia -H&H 7.1/22 -Will transfuse 1 unit of packed red cells  On anticoagulation for atrial fibrillation -Will maintain on anticoagulation however, may need to hold anticoagulation if significant bleeding  Hypertension -Hold home antihypertensives  Diastolic heart failure -Grade 2 dysfunction noted on recent echocardiogram -Monitor closely -BNP 1200  MRSA PCR negative Respiratory viral panel sent-pending result -Will discontinue vancomycin   Best Practice (right click and "Reselect all SmartList Selections" daily)   Diet/type: tubefeeds DVT prophylaxis: systemic heparin GI prophylaxis: PPI Lines: N/A Foley:  N/A Code Status:  full code Last date of multidisciplinary goals of care discussion [pending] Updated spouse by phone 05/24/2022  Labs   CBC: Recent Labs  Lab 05/19/22 0349 05/20/22 0345 06/07/2022 1520 05/30/2022 1831 05/24/22 0231  WBC 7.9 10.0 12.3* 14.7* 10.8*  NEUTROABS  --   --  9.8*  --   --   HGB 9.6* 9.1* 8.5* 8.2* 7.1*  HCT 28.9* 27.3* 25.8* 24.8* 22.5*  MCV 100.7* 98.2 97.7 100.4* 102.3*  PLT 210 239 316 280 938    Basic Metabolic Panel: Recent Labs  Lab 05/18/22 0419 05/19/22 0349 05/20/22 0345 05/24/2022 1520 05/21/2022 1831 05/14/2022 2032 05/24/22 0231  NA 139 137 138 137  --   --  141  K 3.8 3.8 4.3 4.3  --   --  5.3*  CL 108 107 107 106  --   --  108  CO2 21* 21* 21* 20*  --   --  19*  GLUCOSE 123* 106* 117* 124*  --   --  151*  BUN 36* 31* 37* 76*  --   --  93*  CREATININE 1.86* 1.40* 1.66* 2.45* 2.53*  --  3.33*  CALCIUM 8.7* 8.9 9.2 9.2  --   --  9.0  MG 1.9  --   --   --   --  2.5* 2.5*  PHOS  --   --   --   --   --  6.5* 7.1*   GFR: Estimated Creatinine Clearance: 18.9 mL/min (A) (by C-G formula based on SCr of 3.33 mg/dL (H)). Recent Labs  Lab 05/20/22 0345 06/07/2022 1520 05/17/2022 1713 05/26/2022 1831 05/24/22 0231  PROCALCITON  --   --   --  0.17  --   WBC 10.0 12.3*  --  14.7* 10.8*  LATICACIDVEN  --   1.4 1.2  --   --     Liver Function Tests: Recent Labs  Lab 05/12/2022 1520  AST 36  ALT 40  ALKPHOS 59  BILITOT 0.8  PROT 7.2  ALBUMIN 3.3*   No results for input(s): "LIPASE", "AMYLASE" in the last 168 hours. No results for input(s): "AMMONIA" in the last 168 hours.  ABG    Component Value Date/Time   PHART 7.36 05/16/2022 2359   PCO2ART 36 05/22/2022 2359   PO2ART 106 05/19/2022 2359   HCO3 20.3 06/06/2022 2359   TCO2 29 06/21/2019 1034   ACIDBASEDEF 4.5 (H) 05/29/2022 2359   O2SAT 97.1 05/18/2022 2359     Coagulation Profile: Recent Labs  Lab 05/21/2022 1815  INR 2.4*  Cardiac Enzymes: No results for input(s): "CKTOTAL", "CKMB", "CKMBINDEX", "TROPONINI" in the last 168 hours.  HbA1C: Hgb A1c MFr Bld  Date/Time Value Ref Range Status  03/26/2012 08:14 AM 5.9 4.6 - 6.5 % Final    Comment:    Glycemic Control Guidelines for People with Diabetes:Non Diabetic:  <6%Goal of Therapy: <7%Additional Action Suggested:  >8%   08/19/2011 02:44 PM 6.0 4.6 - 6.5 % Final    Comment:    Glycemic Control Guidelines for People with Diabetes:Non Diabetic:  <6%Goal of Therapy: <7%Additional Action Suggested:  >8%     CBG: Recent Labs  Lab 05/11/2022 1916 06/09/2022 2322 05/24/22 0324 05/24/22 0727  GLUCAP 146* 137* 169* 151*    Review of Systems:   Unable to provide  Past Medical History:  He,  has a past medical history of AAA (abdominal aortic aneurysm) ([place]), Carotid artery occlusion, Carotid artery stenosis (05/14/2022), Essential hypertension (10/16/2007), Genital warts, Glucose intolerance (05/14/2022), History of carotid stenosis, Hyperlipidemia, Hypertension, PAD (peripheral artery disease) ([place]), and Peripheral vascular disease ([place]) (10/16/2007).   Surgical History:   Past Surgical History:  Procedure Laterality Date   ABDOMINAL AORTIC ENDOVASCULAR STENT GRAFT N/A 11/19/2018   Procedure: ABDOMINAL AORTIC ENDOVASCULAR STENT GRAFT;  Surgeon: Waynetta Sandy, MD;  Location: [facility];  Service: Vascular;  Laterality: N/A;   [place]  05/2005   right   COLONOSCOPY     KNEE SURGERY  1962   Left Leg stents  2012   VISCERAL ANGIOGRAPHY N/A 06/21/2019   Procedure: VISCERAL ANGIOGRAPHY;  Surgeon: Waynetta Sandy, MD;  Location: [facility];  Service: Cardiovascular;  Laterality: N/A;   VISCERAL ARTERY INTERVENTION N/A 06/21/2019   Procedure: VISCERAL ARTERY INTERVENTION;  Surgeon: Waynetta Sandy, MD;  Location: [facility] CV LAB;  Service: Cardiovascular;  Laterality: N/A;  Stent to SMA     Social History:   reports that he has quit smoking. He has never used smokeless tobacco. He reports current alcohol use of about 24.0 standard drinks of alcohol per week. He reports that he does not use drugs.   Family History:  His family history includes Hyperlipidemia in his father and mother; Peripheral vascular disease in his brother and father; Stroke in his mother. There is no history of Colon cancer, Esophageal cancer, Prostate cancer, Rectal cancer, or Stomach cancer.   Allergies Allergies  Allergen Reactions   Diclofenac Diarrhea   Lipitor [Atorvastatin] Other (See Comments)    Muscle pain   Livalo [Pitavastatin] Other (See Comments)    Muscle aches   Statins     Muscle aches    The patient is critically ill with multiple organ systems failure and requires high complexity decision making for assessment and support, frequent evaluation and titration of therapies, application of advanced monitoring technologies and extensive interpretation of multiple databases. Critical Care Time devoted to patient care services described in this note independent of APP/resident time (if applicable)  is 32 minutes.   Sherrilyn Rist MD [facility] Personal pager: See Amion If unanswered, please page CCM On-call: [phone]

## 2022-05-24 NOTE — Progress Notes (Signed)
[facility] Progress Note Patient Name: Samuel [name] DOB: [birth year] MRN: [id number]   Date of Service  05/24/2022  HPI/Events of Note  BP now 131/74, MAP 88, urine out put 30 ml / hour after iv lasix.  eICU Interventions  No BP intervention indicated at this time, will give Bumex 1 mg iv x 1.        Kerry Kass Shakeira Rhee 05/24/2022, 3:25 AM

## 2022-05-24 NOTE — Progress Notes (Signed)
ANTICOAGULATION CONSULT NOTE - Follow Up Consult  Pharmacy Consult for Heparin while Eliquis on hold Indication: atrial fibrillation   Allergies  Allergen Reactions   Diclofenac Diarrhea   Lipitor [Atorvastatin] Other (See Comments)    Muscle pain   Livalo [Pitavastatin] Other (See Comments)    Muscle aches   Statins     Muscle aches    Patient Measurements: Height: '5\' 10"'$  (177.8 cm) Weight: 79.2 kg (174 lb 9.7 oz) IBW/kg (Calculated) : 73 Heparin Dosing Weight: 79.2 kg  Vital Signs: Temp: 98.4 F (36.9 C) (12/15 1923) Temp Source: Bladder (12/15 1752) BP: 144/56 (12/15 1900) Pulse Rate: 59 (12/15 1923)  Labs: Recent Labs    05/22/2022 1520 05/31/2022 1713 05/11/2022 1815 05/22/2022 1831 05/24/22 0231 05/24/22 0805 05/24/22 1301 05/24/22 1938  HGB 8.5*  --   --  8.2* 7.1*  --   --  8.5*  HCT 25.8*  --   --  24.8* 22.5*  --   --  26.6*  PLT 316  --   --  280 246  --   --   --   APTT  --   --  37*  --   --  60*  --  72*  LABPROT  --   --  26.2*  --   --   --   --   --   INR  --   --  2.4*  --   --   --   --   --   HEPARINUNFRC  --   --   --   --   --  >1.10*  --   --   CREATININE 2.45*  --   --  2.53* 3.33*  --  3.61*  --   TROPONINIHS 66* 84*  --   --   --   --   --   --      Estimated Creatinine Clearance: 17.4 mL/min (A) (by C-G formula based on SCr of 3.61 mg/dL (H)).   Assessment: AC/Heme: Eliquis PTA (LD 12/14 mid-day) for h/o afib>>IV heparin.   aPTT 72, therapeutic on heparin 1350 units/hr Hgb improved 8.5 after transfusion No bleeding or IV line issues per RN   Goal of Therapy:  aPTT 66-102 seconds Monitor platelets by anticoagulation protocol: Yes   Plan: Continue IV heparin at 1350 units/hr Confirmatory aPTT in 8 hours. Daily HL, aPTT, and CBC  Peggyann Juba, PharmD, BCPS Pharmacy: [phone] 05/24/2022,8:35 PM

## 2022-05-24 NOTE — TOC Initial Note (Signed)
Transition of Care [facility]) - Initial/Assessment Note    Patient Details  Name: Samuel [name] MRN: [id number] Date of Birth: 03-05-1946  Transition of Care [facility]) CM/SW Contact:    Roseanne Kaufman, RN Phone Number: 05/24/2022, 4:38 PM  Clinical Narrative:     Patient recently discharged 05/20/22 (8 days for pneumonia). HHPT/OT and TOI:ZTIWPY & nebulizer previously set up.  TOC will continue to follow for disposition.                Barriers to Discharge: Continued Medical Work up   Patient Goals and CMS Choice        Expected Discharge Plan and Services   In-house Referral: NA Discharge Planning Services: CM Consult   Living arrangements for the past 2 months: Single Family Home                                      Prior Living Arrangements/Services Living arrangements for the past 2 months: Single Family Home Lives with:: Spouse              Current home services: DME, Home OT, Home PT (DME: home oxygen, nebulizer ([facility]) and HHPT/OT ([facility]))    Activities of Daily Living Home Assistive Devices/Equipment: None ADL Screening (condition at time of admission) Patient's cognitive ability adequate to safely complete daily activities?: No Is the patient deaf or have difficulty hearing?: No Does the patient have difficulty seeing, even when wearing glasses/contacts?: No Does the patient have difficulty concentrating, remembering, or making decisions?: No Patient able to express need for assistance with ADLs?: No Does the patient have difficulty dressing or bathing?: No Independently performs ADLs?: Yes (appropriate for developmental age) Does the patient have difficulty walking or climbing stairs?: No Weakness of Legs: None Weakness of Arms/Hands: None  Permission Sought/Granted                  Emotional Assessment           Psych Involvement: No (comment)  Admission diagnosis:  Acute respiratory failure with hypoxia ([place]) [J96.01] AKI  (acute kidney injury) ([place]) [N17.9] Multifocal pneumonia [J18.9] Sepsis with acute hypoxic respiratory failure without septic shock, due to unspecified organism ([place]) [A41.9, R65.20, J96.01] ARDS (adult respiratory distress syndrome) ([place]) [J80] Patient Active Problem List   Diagnosis Date Noted   ARDS (adult respiratory distress syndrome) ([place]) 05/30/2022   AKI (acute kidney injury) ([place]) 05/18/2022   Atrial fibrillation with RVR ([place]) 05/16/2022   Severe sepsis ([place]) 05/16/2022   Acute respiratory failure with hypoxia ([place]) 05/14/2022   Adenomatous polyp of colon 05/14/2022   Carotid artery stenosis 05/14/2022   Myositis 05/14/2022   Normocytic anemia 05/14/2022   Glucose intolerance 05/14/2022   Elevated brain natriuretic peptide (BNP) level 05/14/2022   Abnormal EKG 05/14/2022   Pleural effusion 05/14/2022   Multifocal pneumonia 05/14/2022   Volume overload 05/14/2022   Elevated troponin 05/14/2022   Bilateral pseudophakia 03/01/2022   Fuchs' corneal dystrophy of both eyes 03/01/2022   AAA (abdominal aortic aneurysm) ([place]) 11/19/2018   History of gout 10/03/2017   Pain of toe of right foot 10/03/2017   Aftercare following surgery of the circulatory system, NEC 03/02/2013   Proximal muscle weakness 09/10/2012   Occlusion and stenosis of carotid artery without mention of cerebral infarction 02/27/2012   Abdominal aneurysm without mention of rupture 02/27/2012   Acute gouty arthritis 09/27/2010   DIABETES MELLITUS,  TYPE II, BORDERLINE 04/17/2009   CONDYLOMA ACUMINATUM 10/16/2007   Hyperlipidemia 10/16/2007   ALCOHOL USE 10/16/2007   Essential hypertension 10/16/2007   Peripheral vascular disease ([place]) 10/16/2007   CAROTID BRUIT, RIGHT 10/16/2007   PCP:  [name], MD Pharmacy:   [id number] [place], [place] [address]Phone: [phone]: [phone]    Social Determinants of Health (SDOH)  Interventions    Readmission Risk Interventions     No data to display

## 2022-05-24 NOTE — Progress Notes (Signed)
Nurse called over to [facility] and spoke with Lysbeth Galas. RN. Nurse wanted to notify team that pt had a 6 beat run or Vtach. The run was reviewed via the cardiac monitor and a 12-lead was performed. Cardiac strip and 12-lead was placed in pt's physical chart. Nurse also notified nurse that the patient's diastolic pressure and urinary output is starting to trend down. No new orders at this time. Will continue to monitor.

## 2022-05-24 NOTE — Progress Notes (Signed)
[name] Progress Note Patient Name: Samuel [name] DOB: 1946/05/07 MRN: [id number]   Date of Service  05/24/2022  HPI/Events of Note  Repeat ABG results reviewed.  eICU Interventions  No new orders.        Kerry Kass Aaban Griep 05/24/2022, 1:06 AM

## 2022-05-24 NOTE — Progress Notes (Signed)
Initial Nutrition Assessment  DOCUMENTATION CODES:   Not applicable  INTERVENTION:  - Will adjust formula to Pivot 1.5. Recommend below TF regimen:  Tube feeding via OG (tip xray verified in stomach): Pivot 1.5 at 55 ml/h (1320 ml per day) *Start at 48m/hr and advance as tolerated by 190mQ8H go goal  Provides 1980 kcal, 124 gm protein, 990 ml free water daily  - Monitor electrolytes, all noted to be elevated this AM.   - Will monitor for need to adjust TF formula. - Monitor weight trends daily.   NUTRITION DIAGNOSIS:   Inadequate oral intake related to inability to eat as evidenced by NPO status (on vent).  GOAL:   Patient will meet greater than or equal to 90% of their needs  MONITOR:   Vent status, Labs, TF tolerance, Weight trends  REASON FOR ASSESSMENT:   Consult Enteral/tube feeding initiation and management  ASSESSMENT:   [age]o M hx AAA, PAD, HTN [facility] with multifocal PNA and hypoxia, of unclear etiology but received course of abx (rocephin, azithro), [facility] with worse SOB. CXR in ed shows significant progression of bilat infiltrates from prior, concerning for ARDS.  12/15 admit, intubated, TF of Vital HP started at 4040mr  Patient intubated and sedated at time of visit. Daughter at bedside. Daughter unsure of a UBW for patient but did not feel he had lost any weight recently. Per EMR, no significant changes in weight over the past year. Daughter is unsure of patient's usual daily intake. Notes he had well and had a good appetite at Thanksgiving.   Discussed plan for tube feeds. Daughter states she knows someone with a child on tube feeds so no questions.   Will adjust formula from Vital HP to Pivot 1.5 to better meet estimated needs.  Patient noted to have elevated potassium and phosphorus. Will need to monitor for need to make adjustment to tube feed formula.    Medications reviewed and include: Decadron,  Colace, Miralax Propofol Vancomycin  Labs reviewed:  K+ 5.3 Creatinine 3.33 Phosphorus 7.1 Magnesium 2.5   NUTRITION - FOCUSED PHYSICAL EXAM:  Flowsheet Row Most Recent Value  Orbital Region Mild depletion  Upper Arm Region Mild depletion  Thoracic and Lumbar Region Mild depletion  Buccal Region Unable to assess  Temple Region Mild depletion  Clavicle Bone Region Mild depletion  Clavicle and Acromion Bone Region Mild depletion  Scapular Bone Region Unable to assess  Dorsal Hand No depletion  Patellar Region Mild depletion  Anterior Thigh Region Mild depletion  Posterior Calf Region Mild depletion  Edema (RD Assessment) Mild  Hair Reviewed  Eyes Reviewed  Mouth Reviewed  Skin Reviewed  Nails Reviewed       Diet Order:   Diet Order             Diet NPO time specified  Diet effective now                   EDUCATION NEEDS:  Not appropriate for education at this time  Skin:  Skin Assessment: Reviewed RN Assessment  Last BM:  PTA  Height:  Ht Readings from Last 1 Encounters:  06/07/2022 '5\' 10"'$  (1.778 m)   Weight:  Wt Readings from Last 1 Encounters:  06/01/2022 79.2 kg   Ideal Body Weight:  75.45 kg  BMI:  Body mass index is 25.05 kg/m.  Estimated Nutritional Needs:  Kcal:  2000-2200 kcals ([employer]: [id number] BDZHGile intubated)  Protein:  110-125 grams Fluid:  >/= 2L    Samson Frederic RD, LDN For contact information, refer to [facility].

## 2022-05-25 ENCOUNTER — Inpatient Hospital Stay (HOSPITAL_COMMUNITY): Payer: Medicare Other

## 2022-05-25 DIAGNOSIS — N179 Acute kidney failure, unspecified: Secondary | ICD-10-CM | POA: Diagnosis not present

## 2022-05-25 DIAGNOSIS — J9601 Acute respiratory failure with hypoxia: Secondary | ICD-10-CM | POA: Diagnosis not present

## 2022-05-25 DIAGNOSIS — J189 Pneumonia, unspecified organism: Secondary | ICD-10-CM

## 2022-05-25 LAB — CBC
HCT: 27.2 % — ABNORMAL LOW (ref 39.0–52.0)
Hemoglobin: 8.8 g/dL — ABNORMAL LOW (ref 13.0–17.0)
MCH: 31.5 pg (ref 26.0–34.0)
MCHC: 32.4 g/dL (ref 30.0–36.0)
MCV: 97.5 fL (ref 80.0–100.0)
Platelets: 262 10*3/uL (ref 150–400)
RBC: 2.79 MIL/uL — ABNORMAL LOW (ref 4.22–5.81)
RDW: 15.1 % (ref 11.5–15.5)
WBC: 10.2 10*3/uL (ref 4.0–10.5)
nRBC: 0 % (ref 0.0–0.2)

## 2022-05-25 LAB — BASIC METABOLIC PANEL
Anion gap: 12 (ref 5–15)
BUN: 115 mg/dL — ABNORMAL HIGH (ref 8–23)
CO2: 19 mmol/L — ABNORMAL LOW (ref 22–32)
Calcium: 8.7 mg/dL — ABNORMAL LOW (ref 8.9–10.3)
Chloride: 107 mmol/L (ref 98–111)
Creatinine, Ser: 3.34 mg/dL — ABNORMAL HIGH (ref 0.61–1.24)
GFR, Estimated: 18 mL/min — ABNORMAL LOW (ref >60)
Glucose, Bld: 181 mg/dL — ABNORMAL HIGH (ref 70–99)
Potassium: 4.3 mmol/L (ref 3.5–5.1)
Sodium: 138 mmol/L (ref 135–145)

## 2022-05-25 LAB — TYPE AND SCREEN
ABO/RH(D): A POS
Antibody Screen: NEGATIVE
Unit division: 0

## 2022-05-25 LAB — BLOOD GAS, ARTERIAL
Acid-base deficit: 3.2 mmol/L — ABNORMAL HIGH (ref 0.0–2.0)
Bicarbonate: 19.7 mmol/L — ABNORMAL LOW (ref 20.0–28.0)
O2 Saturation: 94.8 %
Patient temperature: 37
pCO2 arterial: 29 mmHg — ABNORMAL LOW (ref 32–48)
pH, Arterial: 7.44 (ref 7.35–7.45)
pO2, Arterial: 74 mmHg — ABNORMAL LOW (ref 83–108)

## 2022-05-25 LAB — BPAM RBC
Blood Product Expiration Date: 202401162359
ISSUE DATE / TIME: 202312151456
Unit Type and Rh: 6200

## 2022-05-25 LAB — GLUCOSE, CAPILLARY
Glucose-Capillary: 173 mg/dL — ABNORMAL HIGH (ref 70–99)
Glucose-Capillary: 162 mg/dL — ABNORMAL HIGH (ref 70–99)

## 2022-05-25 LAB — PHOSPHORUS: Phosphorus: 6.7 mg/dL — ABNORMAL HIGH (ref 2.5–4.6)

## 2022-05-25 LAB — HEPARIN LEVEL (UNFRACTIONATED): Heparin Unfractionated: >1.1 IU/mL — ABNORMAL HIGH (ref 0.30–0.70)

## 2022-05-25 LAB — STREP PNEUMONIAE URINARY ANTIGEN: Strep Pneumo Urinary Antigen: NEGATIVE

## 2022-05-25 LAB — APTT: aPTT: 82 seconds — ABNORMAL HIGH (ref 24–36)

## 2022-05-25 LAB — MAGNESIUM: Magnesium: 3 mg/dL — ABNORMAL HIGH (ref 1.7–2.4)

## 2022-05-25 MED ORDER — FUROSEMIDE 10 MG/ML IJ SOLN
80.0000 mg | Freq: Once | INTRAMUSCULAR | Status: AC
  Administered 2022-05-25: 80 mg via INTRAVENOUS
  Filled 2022-05-25: qty 8

## 2022-05-25 MED ORDER — DEXMEDETOMIDINE HCL IN NACL 200 MCG/50ML IV SOLN
0.4000 ug/kg/h | INTRAVENOUS | Status: DC
  Administered 2022-05-25: 0.4 ug/kg/h via INTRAVENOUS
  Administered 2022-05-26 (×2): 0.6 ug/kg/h via INTRAVENOUS
  Filled 2022-05-25 (×3): qty 50

## 2022-05-25 MED ORDER — AMLODIPINE BESYLATE 10 MG PO TABS
10.0000 mg | ORAL_TABLET | Freq: Every day | ORAL | Status: DC
  Administered 2022-05-25 – 2022-05-26 (×2): 10 mg via ORAL
  Filled 2022-05-25 (×2): qty 1

## 2022-05-25 MED ORDER — HYDRALAZINE HCL 20 MG/ML IJ SOLN
10.0000 mg | INTRAMUSCULAR | Status: DC | PRN
  Administered 2022-05-25 – 2022-05-26 (×3): 20 mg via INTRAVENOUS
  Filled 2022-05-25: qty 2
  Filled 2022-05-25 (×4): qty 1

## 2022-05-25 MED ORDER — HYDRALAZINE HCL 100 MG PO TABS: 50.0000 mg | ORAL_TABLET | Freq: Three times a day (TID) | ORAL | Status: DC

## 2022-05-25 MED ORDER — DEXAMETHASONE SODIUM PHOSPHATE 10 MG/ML IJ SOLN
10.0000 mg | Freq: Two times a day (BID) | INTRAMUSCULAR | Status: DC
  Administered 2022-05-25 – 2022-05-27 (×6): 10 mg via INTRAVENOUS
  Filled 2022-05-25 (×6): qty 1

## 2022-05-25 MED ORDER — HYDRALAZINE HCL 50 MG PO TABS
50.0000 mg | ORAL_TABLET | Freq: Three times a day (TID) | ORAL | Status: DC
  Administered 2022-05-25 – 2022-05-26 (×4): 50 mg via ORAL
  Filled 2022-05-25 (×4): qty 1

## 2022-05-25 NOTE — Progress Notes (Addendum)
NAME:  Samuel [name], MRN:  [id number], DOB:  [birth year], LOS: 2 ADMISSION DATE:  05/19/2022, CONSULTATION DATE: 05/16/2022 REFERRING MD: Dr. Cyd Silence, CHIEF COMPLAINT: Respiratory distress  History of Present Illness:  76 yo M hx AAA, PAD, HTN recently admitted to [facility] 12/5-12/11 with multifocal PNA and hypoxia, of unclear etiology but received course of abx (rocephin, azithro), presented to [facility] ED 12/14 with worse SOB. CXR in ed shows significant progression of bilat infiltrates from prior, concerning for ARDS.  He was hypoxic and placed on BiPAP. Initially planned to be admitted to [facility] but quickly declined despite bipap. PCCM was called urgently for respiratory distress    Last admisison developed new Afib RVR, dc with eliquis Also dc with new O2 req RVP, Covid, flu, legionella, strep ag were neg for that admission.     In d/w wife, she notes that pt was handling raw deer meat before prior admission and seems like he has been declining since. Isnt sure if this might be related. She discussed intubation and code status with Dr. Cyd Silence and endorses full code / full scope offered treatment, which she also confirmed with me prior to intubation.    Pertinent  Medical History   Past Medical History:  Diagnosis Date   AAA (abdominal aortic aneurysm) ([place])    Carotid artery occlusion    Carotid artery stenosis 05/14/2022   Jun 05, 2019 Entered By: Ria Clock Comment: Right carotid endarterectomy.   Essential hypertension 10/16/2007   Qualifier: Diagnosis of   By: Danelle Earthly CMA, Darlene       Genital warts    history of   Glucose intolerance 05/14/2022   History of carotid stenosis    s/p right carotid endarterectomy 12/06   Hyperlipidemia    Hypertension    PAD (peripheral artery disease) ([place])    Left leg   Peripheral vascular disease ([place]) 10/16/2007   Centricity Description: INTERMITTENT CLAUDICATION, LEFT LEG  Qualifier: Diagnosis of   By: Danelle Earthly CMA, Darlene       Centricity Description: UNSPECIFIED PERIPHERAL VASCULAR DISEASE  Qualifier: Diagnosis of   By: Bertell Maria Events: Including procedures, antibiotic start and stop dates in addition to other pertinent events   Chest x-ray with extensive bilateral infiltrate Recent CT scan with extensive bibasilar infiltrate, pleural effusions echocardiogram 52/01/4131 with diastolic dysfunction grade 2  Interim History / Subjective:   Critically ill, calm on propofol drip. Afebrile. On 40%/PEEP of 8 Good urine output  Objective   Blood pressure (!) 154/54, pulse 60, temperature 98.2 F (36.8 C), temperature source Bladder, resp. rate 14, height '5\' 10"'$  (1.778 m), weight 82.4 kg, SpO2 97 %.    Vent Mode: PRVC FiO2 (%):  [40 %] 40 % Set Rate:  [15 bmp] 15 bmp Vt Set:  [540 mL] 540 mL PEEP:  [8 cmH20-10 cmH20] 8 cmH20 Plateau Pressure:  [19 cmH20-25 cmH20] 19 cmH20   Intake/Output Summary (Last 24 hours) at 05/25/2022 0807 Last data filed at 05/25/2022 4401 Gross per 24 hour  Intake 3344.28 ml  Output 915 ml  Net 2429.28 ml    Filed Weights   05/12/2022 1443 05/11/2022 1814 05/25/22 0500  Weight: 81 kg 79.2 kg 82.4 kg   Examination: General: Ill-appearing elderly  man, comfortable on vent  HENT: Endotracheal tube in place, moist oral mucosa Lungs: Bilateral scattered crackles, no accessory muscle use Cardiovascular: S1-S2 regular Abdomen: Soft, bowel sounds appreciated Extremities: Bilateral lower extremity edema Neuro:  Alert, interactive, follows one-step commands GU: Clear urine  Labs show no leukocytosis, stable anemia. Slight increase BUN/creatinine to 108/3.6, today's labs pending  Chest x-ray from 12/15 shows slight decrease in bilateral diffuse interstitial and airspace disease with stable effusions Resolved Hospital Problem list     Assessment & Plan:  Acute respiratory failure with hypoxia ARDS Possible H CAP- RVP neg, respiratory culture  negative -Drop PEEP to 5, start spontaneous breathing trials -Continue cefepime empiric -Decrease Decadron 10 mg IV every 12 -Check urine strep antigen and Legionella for completion  Acute kidney injury on chronic kidney disease -baseline creatinine 1.3 on 12/5 -Avoid nephrotoxic agents -Trend electrolytes -Nonoliguric, likely related to contrast injury and hopeful that this will improve with time  Anemia of critical illness --Improved from 7.1-8.8 after 1 unit PRBC on 12/15 -Follow   New onset atrial fibrillation developed during last admission 12/5 On anticoagulation  -Will maintain on anticoagulation however, may need to hold anticoagulation if significant bleeding  Hypertension -Resume hydralazine 50 3 times daily -Hold bisoprolol and amlodipine  Chronic diastolic heart failure -Grade 2 dysfunction noted on recent echocardiogram -BNP 1200   Summary -unclear if this was HAP resistant to outpatient therapy or "inflammatory" pneumonia that has responded to steroids.  No organisms isolated.  He has clinical improvement and hopeful to extubate soon  Best Practice (right click and "Reselect all SmartList Selections" daily)   Diet/type: tubefeeds DVT prophylaxis: systemic heparin GI prophylaxis: PPI Lines: N/A Foley:  N/A Code Status:  full code Last date of multidisciplinary goals of care discussion [pending] Updated spouse by phone 05/24/2022  Labs   CBC: Recent Labs  Lab 05/20/22 0345 05/22/2022 1520 05/11/2022 1831 05/24/22 0231 05/24/22 1938 05/25/22 0614  WBC 10.0 12.3* 14.7* 10.8*  --  10.2  NEUTROABS  --  9.8*  --   --   --   --   HGB 9.1* 8.5* 8.2* 7.1* 8.5* 8.8*  HCT 27.3* 25.8* 24.8* 22.5* 26.6* 27.2*  MCV 98.2 97.7 100.4* 102.3*  --  97.5  PLT 239 316 280 246  --  262     Basic Metabolic Panel: Recent Labs  Lab 05/19/22 0349 05/20/22 0345 05/18/2022 1520 05/30/2022 1831 06/09/2022 2032 05/24/22 0231 05/24/22 1301 05/24/22 1938  NA 137 138 137  --    --  141 139  --   K 3.8 4.3 4.3  --   --  5.3* 5.1  --   CL 107 107 106  --   --  108 109  --   CO2 21* 21* 20*  --   --  19* 17*  --   GLUCOSE 106* 117* 124*  --   --  151* 139*  --   BUN 31* 37* 76*  --   --  93* 108*  --   CREATININE 1.40* 1.66* 2.45* 2.53*  --  3.33* 3.61*  --   CALCIUM 8.9 9.2 9.2  --   --  9.0 8.3*  --   MG  --   --   --   --  2.5* 2.5*  --  2.8*  PHOS  --   --   --   --  6.5* 7.1*  --  8.1*    GFR: Estimated Creatinine Clearance: 17.4 mL/min (A) (by C-G formula based on SCr of 3.61 mg/dL (H)). Recent Labs  Lab 05/22/2022 1520 05/14/2022 1713 05/14/2022 1831 05/24/22 0231 05/25/22 0614  PROCALCITON  --   --  0.17  --   --  WBC 12.3*  --  14.7* 10.8* 10.2  LATICACIDVEN 1.4 1.2  --   --   --      Liver Function Tests: Recent Labs  Lab 06/09/2022 1520  AST 36  ALT 40  ALKPHOS 59  BILITOT 0.8  PROT 7.2  ALBUMIN 3.3*    No results for input(s): "LIPASE", "AMYLASE" in the last 168 hours. No results for input(s): "AMMONIA" in the last 168 hours.  ABG    Component Value Date/Time   PHART 7.36 05/18/2022 2359   PCO2ART 36 06/08/2022 2359   PO2ART 106 05/11/2022 2359   HCO3 20.3 05/27/2022 2359   TCO2 29 06/21/2019 1034   ACIDBASEDEF 4.5 (H) 05/16/2022 2359   O2SAT 97.1 06/03/2022 2359     Coagulation Profile: Recent Labs  Lab 06/04/2022 1815  INR 2.4*     Cardiac Enzymes: No results for input(s): "CKTOTAL", "CKMB", "CKMBINDEX", "TROPONINI" in the last 168 hours.  HbA1C: Hgb A1c MFr Bld  Date/Time Value Ref Range Status  03/26/2012 08:14 AM 5.9 4.6 - 6.5 % Final    Comment:    Glycemic Control Guidelines for People with Diabetes:Non Diabetic:  <6%Goal of Therapy: <7%Additional Action Suggested:  >8%   08/19/2011 02:44 PM 6.0 4.6 - 6.5 % Final    Comment:    Glycemic Control Guidelines for People with Diabetes:Non Diabetic:  <6%Goal of Therapy: <7%Additional Action Suggested:  >8%     CBG: Recent Labs  Lab 05/24/22 1634  05/24/22 1937 05/24/22 2330 05/25/22 0333 05/25/22 0756  GLUCAP 169* 163* 165* 173* 162*     My independent critical care time was 35 minutes  Kara Mead MD. FCCP. [facility] Pager : [phone]  If no response to pager , please call [phone] until 7 pm After 7:00 pm call Elink  [phone]   05/25/2022

## 2022-05-25 NOTE — Progress Notes (Signed)
Did well initially post extubation but increasing hypoxia & distress through the day Use biPAP Dc IVFs & give lasix 80 x 1  Makaveli Hoard V. Elsworth Soho MD

## 2022-05-25 NOTE — Evaluation (Signed)
Physical Therapy Evaluation Patient Details Name: Samuel [name] MRN: [id number] DOB: Feb 27, 1946 Today's Date: 05/25/2022  History of Present Illness  76 yo male admittd with acute respiratory failure, ARDS, Afib, AKI. Extubated a.m of 05/25/2022. Hx of AAA, PAD  Clinical Impression  On eval, pt was found sitting up in recliner. O2 90% on 8L, RR 30. Placed on 10L in preparation for activity. Pt able to perform pre-gait marching with walker support for ~4 reps each leg before sats dropped to ~83% and RR increased to 38, dyspnea 3/4. Seated rest break taken/needed for recovery. After resting, O2 90% 8L, RR 35, HR 84 bpm. Will plan to follow and progress activity as tolerated. At this time, recommend AIR consult to assess for appropriateness for rehab stay. Will follow and progress activity as tolerated.       Recommendations for follow up therapy are one component of a multi-disciplinary discharge planning process, led by the attending physician.  Recommendations may be updated based on patient status, additional functional criteria and insurance authorization.  Follow Up Recommendations Acute inpatient rehab (3hours/day)      Assistance Recommended at Discharge Frequent or constant Supervision/Assistance  Patient can return home with the following  A lot of help with walking and/or transfers;A lot of help with bathing/dressing/bathroom;Assistance with cooking/housework;Assist for transportation;Help with stairs or ramp for entrance    Equipment Recommendations Rolling walker (2 wheels)  Recommendations for Other Services  OT consult;Rehab consult    Functional Status Assessment Patient has had a recent decline in their functional status and demonstrates the ability to make significant improvements in function in a reasonable and predictable amount of time.     Precautions / Restrictions Precautions Precautions: Fall Precaution Comments: monitor vitals; current HH O2  req Restrictions Weight Bearing Restrictions: No      Mobility  Bed Mobility               General bed mobility comments: oob in recliner    Transfers Overall transfer level: Needs assistance Equipment used: Rollator (4 wheels) Transfers: Sit to/from Stand Sit to Stand: Min assist, +2 physical assistance, +2 safety/equipment           General transfer comment: Assist to rise, steady, control descent. Cues for safety, hand placement. Increased time. A bit effortful for pt.    Ambulation/Gait               General Gait Details: Pre-gait marching x 4 reps each leg. O2 dropped to 83% on 10L, RR 38  Stairs            Wheelchair Mobility    Modified Rankin (Stroke Patients Only)       Balance Overall balance assessment: Needs assistance         Standing balance support: Bilateral upper extremity supported, Reliant on assistive device for balance, During functional activity Standing balance-Leahy Scale: Poor                               Pertinent Vitals/Pain Pain Assessment Pain Assessment: No/denies pain    Home Living Family/patient expects to be discharged to:: Private residence Living Arrangements: Spouse/significant other;Other relatives (mother n law) Available Help at Discharge: Family Type of Home: House Home Access: Ramped entrance       Home Layout: One level Home Equipment: None      Prior Function Prior Level of Function : Independent/Modified Independent  Hand Dominance        Extremity/Trunk Assessment   Upper Extremity Assessment Upper Extremity Assessment: Defer to OT evaluation    Lower Extremity Assessment Lower Extremity Assessment: Generalized weakness    Cervical / Trunk Assessment Cervical / Trunk Assessment: Normal  Communication   Communication: No difficulties  Cognition Arousal/Alertness: Awake/alert Behavior During Therapy: WFL for tasks  assessed/performed Overall Cognitive Status: Within Functional Limits for tasks assessed                                          General Comments      Exercises     Assessment/Plan    PT Assessment Patient needs continued PT services  PT Problem List Decreased strength;Decreased activity tolerance;Decreased balance;Decreased mobility;Decreased knowledge of use of DME;Cardiopulmonary status limiting activity       PT Treatment Interventions DME instruction;Gait training;Therapeutic exercise;Balance training;Functional mobility training;Patient/family education;Therapeutic activities    PT Goals (Current goals can be found in the Care Plan section)  Acute Rehab PT Goals Patient Stated Goal: to get better PT Goal Formulation: With patient Time For Goal Achievement: 06/08/22 Potential to Achieve Goals: Good    Frequency Min 3X/week     Co-evaluation               AM-PAC PT "6 Clicks" Mobility  Outcome Measure Help needed turning from your back to your side while in a flat bed without using bedrails?: A Lot Help needed moving from lying on your back to sitting on the side of a flat bed without using bedrails?: A Lot Help needed moving to and from a bed to a chair (including a wheelchair)?: A Lot Help needed standing up from a chair using your arms (e.g., wheelchair or bedside chair)?: A Lot Help needed to walk in hospital room?: Total Help needed climbing 3-5 steps with a railing? : Total 6 Click Score: 10    End of Session Equipment Utilized During Treatment: Oxygen Activity Tolerance: Patient limited by fatigue (limited by cardiopulmonary status) Patient left: in chair;with call bell/phone within reach   PT Visit Diagnosis: Muscle weakness (generalized) (M62.81);Difficulty in walking, not elsewhere classified (R26.2)    Time: 1950-9326 PT Time Calculation (min) (ACUTE ONLY): 36 min   Charges:   PT Evaluation $PT Eval Moderate Complexity: [place], PT Acute Rehabilitation  Office: [phone]

## 2022-05-25 NOTE — Progress Notes (Addendum)
Samuel [name] was extubated to a 6L [place] at 49.  Pt. Tolerated well.  He states that he needs to have a bowl movement and he was placed on the bed pan.  He is very mobile and talkative.  He is doing well other than the requirement of 6L of oxygen. HR= 66, RR=18 and O2 sat =91%

## 2022-05-25 NOTE — Progress Notes (Signed)
[place] Progress Note Patient Name: GRIFFIN GERRARD DOB: Dec 26, 1945 MRN: [id number]   Date of Service  05/25/2022  HPI/Events of Note  05/25/22 22:20 pH, Arterial: 7.44 pCO2 arterial: 29 (L) pO2, Arterial: 74 (L) Acid-base deficit: 3.2 (H) Bicarbonate: 19.7 (L) O2 Saturation: 94.8  Doing better on Precedex Am labs with improved metabolic acidosis and stable/improved renal function  eICU Interventions  Include lactic acid to labs     Intervention Category Intermediate Interventions: Diagnostic test evaluation  Judd Lien 05/25/2022, 11:09 PM

## 2022-05-25 NOTE — Progress Notes (Signed)
ANTICOAGULATION CONSULT NOTE - Follow Up Consult  Pharmacy Consult for Heparin Indication: atrial fibrillation  Allergies  Allergen Reactions   Diclofenac Diarrhea   Lipitor [Atorvastatin] Other (See Comments)    Muscle pain   Livalo [Pitavastatin] Other (See Comments)    Muscle aches   Statins     Muscle aches    Patient Measurements: Height: '5\' 10"'$  (177.8 cm) Weight: 82.4 kg (181 lb 10.5 oz) IBW/kg (Calculated) : 73 Heparin Dosing Weight: 82.4 kg  Vital Signs: Temp: 98.2 F (36.8 C) (12/16 0700) Temp Source: Bladder (12/16 0700) BP: 154/54 (12/16 0700) Pulse Rate: 60 (12/16 0700)  Labs: Recent Labs    05/16/2022 1520 06/02/2022 1520 05/20/2022 1713 05/24/2022 1815 06/07/2022 1831 05/24/22 0231 05/24/22 0805 05/24/22 1301 05/24/22 1938 05/25/22 0614  HGB 8.5*  --   --   --  8.2* 7.1*  --   --  8.5* 8.8*  HCT 25.8*  --   --   --  24.8* 22.5*  --   --  26.6* 27.2*  PLT 316  --   --   --  280 246  --   --   --  262  APTT  --    < >  --  37*  --   --  60*  --  72* 82*  LABPROT  --   --   --  26.2*  --   --   --   --   --   --   INR  --   --   --  2.4*  --   --   --   --   --   --   HEPARINUNFRC  --   --   --   --   --   --  >1.10*  --   --  >1.10*  CREATININE 2.45*  --   --   --  2.53* 3.33*  --  3.61*  --   --   TROPONINIHS 66*  --  84*  --   --   --   --   --   --   --    < > = values in this interval not displayed.    Estimated Creatinine Clearance: 17.4 mL/min (A) (by C-G formula based on SCr of 3.61 mg/dL (H)).  Assessment: AC/Heme: Eliquis PTA (LD 12/14 mid-day) for h/o afib>> 12/14 to IV heparin.   - Hgb 8.2 >7.1 transfuse 12/15>>Hgb 8.8, Plts WNL - Hep level >1.1 as expected on Eliquis - aPTT 82 in goal (66-102)  Goal of Therapy:  aPTT 66-102 seconds Monitor platelets by anticoagulation protocol: Yes   Plan:  PTA Eliquis on hold Con't  IV heparin at 1350 units/hr Daily HL, aPTT, and CBC Awaiting BMET for any abx adjustments needed   Ireta Pullman S.  Alford Highland, PharmD, BCPS Clinical Staff Pharmacist [email or web address] Alford Highland, Taren Toops Stillinger 05/25/2022,8:27 AM

## 2022-05-25 NOTE — Progress Notes (Signed)
[facility] Progress Note Patient Name: Samuel [name] DOB: 09-Apr-1946 MRN: [id number]   Date of Service  05/25/2022  HPI/Events of Note  Pt with continued confusion/ agitation on BIPAP.  eICU Interventions  ABG ordered Precedex ordered to facilitate use of BiPap eLink to be informed of ABG result     Intervention Category Intermediate Interventions: Other:  Judd Lien 05/25/2022, 10:08 PM

## 2022-05-26 ENCOUNTER — Inpatient Hospital Stay (HOSPITAL_COMMUNITY): Payer: Medicare Other

## 2022-05-26 DIAGNOSIS — J9601 Acute respiratory failure with hypoxia: Secondary | ICD-10-CM | POA: Diagnosis not present

## 2022-05-26 DIAGNOSIS — J810 Acute pulmonary edema: Secondary | ICD-10-CM | POA: Diagnosis not present

## 2022-05-26 DIAGNOSIS — N179 Acute kidney failure, unspecified: Secondary | ICD-10-CM | POA: Diagnosis not present

## 2022-05-26 LAB — BRAIN NATRIURETIC PEPTIDE: B Natriuretic Peptide: 2110 pg/mL — ABNORMAL HIGH (ref 0.0–100.0)

## 2022-05-26 LAB — HEPARIN LEVEL (UNFRACTIONATED): Heparin Unfractionated: >1.1 IU/mL — ABNORMAL HIGH (ref 0.30–0.70)

## 2022-05-26 LAB — CULTURE, RESPIRATORY W GRAM STAIN: Culture: NO GROWTH

## 2022-05-26 LAB — BLOOD GAS, ARTERIAL
Acid-base deficit: 7.5 mmol/L — ABNORMAL HIGH (ref 0.0–2.0)
Bicarbonate: 20.4 mmol/L (ref 20.0–28.0)
O2 Saturation: 96.8 %
Patient temperature: 37
pCO2 arterial: 51 mmHg — ABNORMAL HIGH (ref 32–48)
pH, Arterial: 7.21 — ABNORMAL LOW (ref 7.35–7.45)
pO2, Arterial: 95 mmHg (ref 83–108)

## 2022-05-26 LAB — CBC
HCT: 24.5 % — ABNORMAL LOW (ref 39.0–52.0)
Hemoglobin: 8.1 g/dL — ABNORMAL LOW (ref 13.0–17.0)
MCH: 31.5 pg (ref 26.0–34.0)
MCHC: 33.1 g/dL (ref 30.0–36.0)
MCV: 95.3 fL (ref 80.0–100.0)
Platelets: 241 10*3/uL (ref 150–400)
RBC: 2.57 MIL/uL — ABNORMAL LOW (ref 4.22–5.81)
RDW: 14.9 % (ref 11.5–15.5)
WBC: 8.6 10*3/uL (ref 4.0–10.5)
nRBC: 0.2 % (ref 0.0–0.2)

## 2022-05-26 LAB — BASIC METABOLIC PANEL
Anion gap: 12 (ref 5–15)
BUN: 123 mg/dL — ABNORMAL HIGH (ref 8–23)
CO2: 19 mmol/L — ABNORMAL LOW (ref 22–32)
Calcium: 8.6 mg/dL — ABNORMAL LOW (ref 8.9–10.3)
Chloride: 113 mmol/L — ABNORMAL HIGH (ref 98–111)
Creatinine, Ser: 3.14 mg/dL — ABNORMAL HIGH (ref 0.61–1.24)
GFR, Estimated: 20 mL/min — ABNORMAL LOW (ref >60)
Glucose, Bld: 160 mg/dL — ABNORMAL HIGH (ref 70–99)
Potassium: 4.5 mmol/L (ref 3.5–5.1)
Sodium: 144 mmol/L (ref 135–145)

## 2022-05-26 LAB — GLUCOSE, CAPILLARY
Glucose-Capillary: 129 mg/dL — ABNORMAL HIGH (ref 70–99)
Glucose-Capillary: 135 mg/dL — ABNORMAL HIGH (ref 70–99)
Glucose-Capillary: 153 mg/dL — ABNORMAL HIGH (ref 70–99)
Glucose-Capillary: 154 mg/dL — ABNORMAL HIGH (ref 70–99)
Glucose-Capillary: 160 mg/dL — ABNORMAL HIGH (ref 70–99)
Glucose-Capillary: 162 mg/dL — ABNORMAL HIGH (ref 70–99)
Glucose-Capillary: 165 mg/dL — ABNORMAL HIGH (ref 70–99)
Glucose-Capillary: 176 mg/dL — ABNORMAL HIGH (ref 70–99)

## 2022-05-26 LAB — APTT: aPTT: 76 seconds — ABNORMAL HIGH (ref 24–36)

## 2022-05-26 LAB — LACTIC ACID, PLASMA: Lactic Acid, Venous: 1.2 mmol/L (ref 0.5–1.9)

## 2022-05-26 MED ORDER — FENTANYL CITRATE PF 50 MCG/ML IJ SOSY
PREFILLED_SYRINGE | INTRAMUSCULAR | Status: AC
  Administered 2022-05-26: 50 ug
  Filled 2022-05-26: qty 2

## 2022-05-26 MED ORDER — FENTANYL CITRATE (PF) 100 MCG/2ML IJ SOLN
INTRAMUSCULAR | Status: AC
  Administered 2022-05-26: 100 ug
  Filled 2022-05-26: qty 2

## 2022-05-26 MED ORDER — FUROSEMIDE 10 MG/ML IJ SOLN
80.0000 mg | Freq: Once | INTRAMUSCULAR | Status: AC
  Administered 2022-05-26: 80 mg via INTRAVENOUS
  Filled 2022-05-26: qty 8

## 2022-05-26 MED ORDER — FAMOTIDINE 20 MG PO TABS
20.0000 mg | ORAL_TABLET | Freq: Every day | ORAL | Status: DC
  Administered 2022-05-26 – 2022-05-27 (×2): 20 mg
  Filled 2022-05-26 (×2): qty 1

## 2022-05-26 MED ORDER — PROPOFOL 1000 MG/100ML IV EMUL
0.0000 ug/kg/min | INTRAVENOUS | Status: DC
  Administered 2022-05-26: 40 ug/kg/min via INTRAVENOUS
  Administered 2022-05-26: 20 ug/kg/min via INTRAVENOUS
  Administered 2022-05-27: 25 ug/kg/min via INTRAVENOUS
  Filled 2022-05-26 (×3): qty 100

## 2022-05-26 MED ORDER — ROCURONIUM BROMIDE 10 MG/ML (PF) SYRINGE
PREFILLED_SYRINGE | INTRAVENOUS | Status: AC
  Filled 2022-05-26: qty 10

## 2022-05-26 MED ORDER — DEXMEDETOMIDINE HCL IN NACL 200 MCG/50ML IV SOLN
0.4000 ug/kg/h | INTRAVENOUS | Status: DC
  Administered 2022-05-26: 0.4 ug/kg/h via INTRAVENOUS

## 2022-05-26 MED ORDER — ORAL CARE MOUTH RINSE: 15.0000 mL | OROMUCOSAL | Status: DC | PRN

## 2022-05-26 MED ORDER — PHENYLEPHRINE 80 MCG/ML (10ML) SYRINGE FOR IV PUSH (FOR BLOOD PRESSURE SUPPORT)
PREFILLED_SYRINGE | INTRAVENOUS | Status: AC
  Filled 2022-05-26: qty 10

## 2022-05-26 MED ORDER — ETOMIDATE 2 MG/ML IV SOLN
INTRAVENOUS | Status: AC
  Administered 2022-05-26: 50 mg
  Filled 2022-05-26: qty 20

## 2022-05-26 MED ORDER — POLYETHYLENE GLYCOL 3350 17 G PO PACK: 17.0000 g | PACK | Freq: Every day | ORAL | Status: DC

## 2022-05-26 MED ORDER — FENTANYL CITRATE PF 50 MCG/ML IJ SOSY
25.0000 ug | PREFILLED_SYRINGE | INTRAMUSCULAR | Status: DC | PRN
  Administered 2022-05-26: 50 ug via INTRAVENOUS
  Administered 2022-05-27 (×2): 100 ug via INTRAVENOUS
  Administered 2022-05-27 (×2): 50 ug via INTRAVENOUS
  Administered 2022-05-27: 100 ug via INTRAVENOUS
  Administered 2022-05-27: 50 ug via INTRAVENOUS
  Administered 2022-05-27: 100 ug via INTRAVENOUS
  Administered 2022-05-27 – 2022-05-28 (×2): 50 ug via INTRAVENOUS
  Filled 2022-05-26: qty 1
  Filled 2022-05-26 (×2): qty 2
  Filled 2022-05-26 (×3): qty 1
  Filled 2022-05-26: qty 2
  Filled 2022-05-26 (×3): qty 1
  Filled 2022-05-26: qty 2

## 2022-05-26 MED ORDER — FENTANYL CITRATE PF 50 MCG/ML IJ SOSY: 25.0000 ug | PREFILLED_SYRINGE | INTRAMUSCULAR | Status: DC | PRN

## 2022-05-26 MED ORDER — MIDAZOLAM HCL 2 MG/2ML IJ SOLN
INTRAMUSCULAR | Status: AC
  Administered 2022-05-26: 2 mg
  Filled 2022-05-26: qty 2

## 2022-05-26 MED ORDER — SUCCINYLCHOLINE CHLORIDE 200 MG/10ML IV SOSY
PREFILLED_SYRINGE | INTRAVENOUS | Status: AC
  Filled 2022-05-26: qty 10

## 2022-05-26 NOTE — Progress Notes (Signed)
OT Cancellation Note  Patient Details Name: Samuel [name] MRN: [id number] DOB: [birth year]   Cancelled Treatment:     Patient on Bipap and requiring Precedex. Will f/u as able.  Nithin Demeo L Asha Grumbine 05/26/2022, 12:49 PM

## 2022-05-26 NOTE — Progress Notes (Signed)
  Called to bedside for respiratory distress. He was placed back on BiPAP , Precedex restarted for anxiety IV Lasix 80 mg given Chest x-ray shows worsening bilateral infiltrates. In spite of above interventions, he remains tachypneic to the 40s He was intubated, wife at bedside informed Procedure was uneventful He will be placed on PEEP of 8, volume 8 cc/kg. Started on propofol for sedation with intermittent fentanyl   My additional critical care time was 30 minutes  Mohamed Portlock V. Elsworth Soho MD

## 2022-05-26 NOTE — Progress Notes (Signed)
HOSPITAL MEDICINE EVENT NOTE    Was initially called by our partners in the emergency department to admit this patient to the medicine service but upon my evaluation it was clear from respiratory standpoint that the patient was rapidly declining on BiPAP therapy requiring intubation and mechanical ventilation.  I promptly contacted Dr. Ander Slade and NP Eliseo Gum for their assistance in assessing him and placed him in the intensive care unit.  After the prompt evaluation it was clear that the patient required intubation and admission to their service for now.  PCCM is greatly appreciated.  Please contact our hospitalist group once the patient is clinically improved and is ready to leave the intensive care unit and come to the medical service.  Vernelle Emerald  MD [facility]

## 2022-05-26 NOTE — Progress Notes (Signed)
Inpatient Rehab Admissions Coordinator Note:   Per PT patient was screened for CIR candidacy by Shaunta Oncale Danford Bad, CCC-SLP. At this time, pt is not yet at a level to tolerate the intensity required to pursue a CIR admit. Admissions team will follow and monitor for progress. A consult order will be placed if pt appears to be an appropriate candidate.    Gayland Curry, MS, CCC-SLP Admissions Coordinator [phone] 05/26/22 1:25 PM

## 2022-05-26 NOTE — Progress Notes (Signed)
NAME:  Samuel [name], MRN:  [id number], DOB:  [birth year], LOS: 3 ADMISSION DATE:  06/06/2022, CONSULTATION DATE: 05/29/2022 REFERRING MD: Dr. Cyd Silence, CHIEF COMPLAINT: Respiratory distress  History of Present Illness:  76 yo M hx AAA, PAD, HTN recently admitted to [facility] 12/5-12/11 with multifocal PNA and hypoxia, of unclear etiology but received course of abx (rocephin, azithro), presented to [facility] ED 12/14 with worse SOB. CXR in ed shows significant progression of bilat infiltrates from prior, concerning for ARDS.  He was hypoxic and placed on BiPAP. Initially planned to be admitted to [facility] but quickly declined despite bipap. PCCM was called urgently for respiratory distress    Last admisison developed new Afib RVR, dc with eliquis Also dc with new O2 req RVP, Covid, flu, legionella, strep ag were neg for that admission.     In d/w wife, she notes that pt was handling raw deer meat before prior admission and seems like he has been declining since. Isnt sure if this might be related. She discussed intubation and code status with Dr. Cyd Silence and endorses full code / full scope offered treatment, which she also confirmed with me prior to intubation.    Pertinent  Medical History   Past Medical History:  Diagnosis Date   AAA (abdominal aortic aneurysm) ([place])    Carotid artery occlusion    Carotid artery stenosis 05/14/2022   Jun 05, 2019 Entered By: Ria Clock Comment: Right carotid endarterectomy.   Essential hypertension 10/16/2007   Qualifier: Diagnosis of   By: Danelle Earthly CMA, Darlene       Genital warts    history of   Glucose intolerance 05/14/2022   History of carotid stenosis    s/p right carotid endarterectomy 12/06   Hyperlipidemia    Hypertension    PAD (peripheral artery disease) ([place])    Left leg   Peripheral vascular disease ([place]) 10/16/2007   Centricity Description: INTERMITTENT CLAUDICATION, LEFT LEG  Qualifier: Diagnosis of   By: Danelle Earthly CMA, Darlene       Centricity Description: UNSPECIFIED PERIPHERAL VASCULAR DISEASE  Qualifier: Diagnosis of   By: Bertell Maria Events: Including procedures, antibiotic start and stop dates in addition to other pertinent events   Chest x-ray with extensive bilateral infiltrate Recent CT scan with extensive BL infiltrate, pleural effusions echocardiogram 78/07/9560 with diastolic dysfunction grade 2 ETT 12/14 - 12/16  Interim History / Subjective:   Extubated yesterday to nasal cannula but had increased oxygen needs and placed on BiPAP by evening. Precedex added to maintain BiPAP overnight. Remains critically ill Diuresed 600 cc with 80 mg of Lasix Afebrile  Objective   Blood pressure (!) 133/55, pulse (!) 59, temperature 98.1 F (36.7 C), temperature source Oral, resp. rate (!) 25, height '5\' 10"'$  (1.778 m), weight 82.4 kg, SpO2 100 %.    Vent Mode: PSV;CPAP FiO2 (%):  [40 %] 40 % Pressure Support:  [5 cmH20] 5 cmH20   Intake/Output Summary (Last 24 hours) at 05/26/2022 0845 Last data filed at 05/26/2022 0700 Gross per 24 hour  Intake 640.29 ml  Output 1450 ml  Net -809.71 ml    Filed Weights   06/02/2022 1443 06/09/2022 1814 05/25/22 0500  Weight: 81 kg 79.2 kg 82.4 kg   Examination: General: Ill-appearing elderly  man, comfortable on BiPAP HENT: moist oral mucosa, no JVD, mild pallor Lungs: Bilateral basal scattered crackles, no accessory muscle use Cardiovascular: S1-S2 regular Abdomen: Soft, bowel sounds appreciated Extremities: Bilateral  lower extremity edema Neuro: Alert, interactive, nonfocal, calm GU: Clear urine  Labs show no leukocytosis, stable anemia, BNP is high, BUN/creatinine 123/3.1  Chest x-ray from 12/16 shows slight decrease in bilateral diffuse interstitial and airspace disease, especially on right with stable effusions Resolved Hospital Problem list     Assessment & Plan:   ARDS Possible H CAP- RVP neg, respiratory culture negative,  urine strep antigen neg Previous CT from 2020 suggests mild ILD but improvement in infiltrates makes IPF flare less likely -Extubated 12/16 but requiring BiPAP intermittent, transition to high flow nasal cannula this morning -Continue cefepime empiric x 7 ds -continue Decadron 10 mg IV every 12 empiric -Check Legionella for completion -HRCT chest at some point if he is able to maintain on high flow nasal cannula  Acute kidney injury on chronic kidney disease -baseline creatinine 1.3 on 12/5 -Likely contrast-induced,Nonoliguric -Avoid nephrotoxic agents -Trend electrolytes   Anemia of critical illness --Improved from 7.1 after 1 unit PRBC on 12/15, slowly trending down again -Follow   New onset atrial fibrillation developed during last admission 12/5 On anticoagulation , maintaining sinus rhythm this admission - hold anticoagulation due to bloody sputum  Hypertension -Resumed hydralazine 50 3 times daily -Hold bisoprolol and amlodipine  Chronic diastolic heart failure -Grade 2 dysfunction noted on recent echocardiogram -BNP rising -Repeat 80 mg Lasix IV   Summary -unclear if this was HAP resistant to outpatient therapy or "inflammatory" pneumonia versus IPF flare [note pre-existing ILD on CT 2020] that has responded to steroids.  No organisms isolated.  Currently he is showing some clinical improvement hopefully he will stay off BiPAP today  Best Practice (right click and "Reselect all SmartList Selections" daily)   Diet/type: full liquids  DVT prophylaxis: systemic heparin GI prophylaxis: PPI Lines: N/A Foley:  N/A Code Status:  full code Last date of multidisciplinary goals of care discussion [pending] Updated spouse at bedside  Labs   CBC: Recent Labs  Lab 05/22/2022 1520 05/31/2022 1831 05/24/22 0231 05/24/22 1938 05/25/22 0614 05/26/22 0745  WBC 12.3* 14.7* 10.8*  --  10.2 8.6  NEUTROABS 9.8*  --   --   --   --   --   HGB 8.5* 8.2* 7.1* 8.5* 8.8* 8.1*  HCT  25.8* 24.8* 22.5* 26.6* 27.2* 24.5*  MCV 97.7 100.4* 102.3*  --  97.5 95.3  PLT 316 280 246  --  262 241     Basic Metabolic Panel: Recent Labs  Lab 05/20/22 0345 06/01/2022 1520 05/24/2022 1831 06/07/2022 2032 05/24/22 0231 05/24/22 1301 05/24/22 1938 05/25/22 0614  NA 138 137  --   --  141 139  --  138  K 4.3 4.3  --   --  5.3* 5.1  --  4.3  CL 107 106  --   --  108 109  --  107  CO2 21* 20*  --   --  19* 17*  --  19*  GLUCOSE 117* 124*  --   --  151* 139*  --  181*  BUN 37* 76*  --   --  93* 108*  --  115*  CREATININE 1.66* 2.45* 2.53*  --  3.33* 3.61*  --  3.34*  CALCIUM 9.2 9.2  --   --  9.0 8.3*  --  8.7*  MG  --   --   --  2.5* 2.5*  --  2.8* 3.0*  PHOS  --   --   --  6.5* 7.1*  --  8.1* 6.7*    GFR: Estimated Creatinine Clearance: 18.8 mL/min (A) (by C-G formula based on SCr of 3.34 mg/dL (H)). Recent Labs  Lab 05/24/2022 1520 06/08/2022 1713 05/13/2022 1831 05/24/22 0231 05/25/22 0614 05/26/22 0745  PROCALCITON  --   --  0.17  --   --   --   WBC 12.3*  --  14.7* 10.8* 10.2 8.6  LATICACIDVEN 1.4 1.2  --   --   --  1.2     Liver Function Tests: Recent Labs  Lab 06/04/2022 1520  AST 36  ALT 40  ALKPHOS 59  BILITOT 0.8  PROT 7.2  ALBUMIN 3.3*    No results for input(s): "LIPASE", "AMYLASE" in the last 168 hours. No results for input(s): "AMMONIA" in the last 168 hours.  ABG    Component Value Date/Time   PHART 7.44 05/25/2022 2220   PCO2ART 29 (L) 05/25/2022 2220   PO2ART 74 (L) 05/25/2022 2220   HCO3 19.7 (L) 05/25/2022 2220   TCO2 29 06/21/2019 1034   ACIDBASEDEF 3.2 (H) 05/25/2022 2220   O2SAT 94.8 05/25/2022 2220     Coagulation Profile: Recent Labs  Lab 05/11/2022 1815  INR 2.4*     Cardiac Enzymes: No results for input(s): "CKTOTAL", "CKMB", "CKMBINDEX", "TROPONINI" in the last 168 hours.  HbA1C: Hgb A1c MFr Bld  Date/Time Value Ref Range Status  03/26/2012 08:14 AM 5.9 4.6 - 6.5 % Final    Comment:    Glycemic Control Guidelines for  People with Diabetes:Non Diabetic:  <6%Goal of Therapy: <7%Additional Action Suggested:  >8%   08/19/2011 02:44 PM 6.0 4.6 - 6.5 % Final    Comment:    Glycemic Control Guidelines for People with Diabetes:Non Diabetic:  <6%Goal of Therapy: <7%Additional Action Suggested:  >8%     CBG: Recent Labs  Lab 05/25/22 1141 05/25/22 2015 05/25/22 2334 05/26/22 0421 05/26/22 0724  GLUCAP 129* 160* 135* 153* 154*     My independent critical care time was 34 minutes  Kara Mead MD. FCCP. [facility] Pager : [phone]  If no response to pager , please call [phone] until 7 pm After 7:00 pm call Elink  [phone]   05/26/2022

## 2022-05-26 NOTE — Procedures (Signed)
Intubation Procedure Note  RISHAWN WALCK  [id number]  [birth year]  Date:05/26/22  Time:6:42 PM   Provider Performing:Kyannah Climer V. Selig [place]    Procedure: Intubation (31500)  Indication(s) Respiratory Failure  Consent Risks of the procedure as well as the alternatives and risks of each were explained to the patient and/or caregiver.  Consent for the procedure was obtained and is signed in the bedside chart   Anesthesia Etomidate, Versed, and Fentanyl   Time Out Verified patient identification, verified procedure, site/side was marked, verified correct patient position, special equipment/implants available, medications/allergies/relevant history reviewed, required imaging and test results available.   Sterile Technique Usual hand hygeine, masks, and gloves were used   Procedure Description Patient positioned in bed supine.  Sedation given as noted above.  Patient was intubated with endotracheal tube using Glidescope.  View was Grade 1 full glottis .  Number of attempts was 1.  Colorimetric CO2 detector was consistent with tracheal placement.   Complications/Tolerance None; patient tolerated the procedure well. Chest X-ray is ordered to verify placement.   EBL Minimal   Specimen(s) None  Jode Lippe V. Elsworth Soho MD

## 2022-05-27 ENCOUNTER — Inpatient Hospital Stay (HOSPITAL_COMMUNITY): Payer: Medicare Other

## 2022-05-27 DIAGNOSIS — N179 Acute kidney failure, unspecified: Secondary | ICD-10-CM | POA: Diagnosis not present

## 2022-05-27 DIAGNOSIS — I5031 Acute diastolic (congestive) heart failure: Secondary | ICD-10-CM | POA: Diagnosis not present

## 2022-05-27 DIAGNOSIS — I214 Non-ST elevation (NSTEMI) myocardial infarction: Secondary | ICD-10-CM

## 2022-05-27 DIAGNOSIS — I5032 Chronic diastolic (congestive) heart failure: Secondary | ICD-10-CM

## 2022-05-27 DIAGNOSIS — J80 Acute respiratory distress syndrome: Secondary | ICD-10-CM

## 2022-05-27 DIAGNOSIS — E785 Hyperlipidemia, unspecified: Secondary | ICD-10-CM

## 2022-05-27 DIAGNOSIS — J189 Pneumonia, unspecified organism: Principal | ICD-10-CM

## 2022-05-27 DIAGNOSIS — J9601 Acute respiratory failure with hypoxia: Secondary | ICD-10-CM | POA: Diagnosis not present

## 2022-05-27 DIAGNOSIS — I480 Paroxysmal atrial fibrillation: Secondary | ICD-10-CM

## 2022-05-27 DIAGNOSIS — I739 Peripheral vascular disease, unspecified: Secondary | ICD-10-CM

## 2022-05-27 LAB — BASIC METABOLIC PANEL
Anion gap: 21 — ABNORMAL HIGH (ref 5–15)
BUN: 137 mg/dL — ABNORMAL HIGH (ref 8–23)
CO2: 11 mmol/L — ABNORMAL LOW (ref 22–32)
Calcium: 8.2 mg/dL — ABNORMAL LOW (ref 8.9–10.3)
Chloride: 108 mmol/L (ref 98–111)
Creatinine, Ser: 5.44 mg/dL — ABNORMAL HIGH (ref 0.61–1.24)
GFR, Estimated: 10 mL/min — ABNORMAL LOW (ref >60)
Glucose, Bld: 250 mg/dL — ABNORMAL HIGH (ref 70–99)
Potassium: 6.3 mmol/L (ref 3.5–5.1)
Sodium: 140 mmol/L (ref 135–145)

## 2022-05-27 LAB — RENAL FUNCTION PANEL
Albumin: 2.5 g/dL — ABNORMAL LOW (ref 3.5–5.0)
Anion gap: 25 — ABNORMAL HIGH (ref 5–15)
BUN: 124 mg/dL — ABNORMAL HIGH (ref 8–23)
CO2: 16 mmol/L — ABNORMAL LOW (ref 22–32)
Calcium: 7.3 mg/dL — ABNORMAL LOW (ref 8.9–10.3)
Chloride: 106 mmol/L (ref 98–111)
Creatinine, Ser: 5.17 mg/dL — ABNORMAL HIGH (ref 0.61–1.24)
GFR, Estimated: 11 mL/min — ABNORMAL LOW (ref >60)
Glucose, Bld: 139 mg/dL — ABNORMAL HIGH (ref 70–99)
Phosphorus: 15 mg/dL — ABNORMAL HIGH (ref 2.5–4.6)
Potassium: 5.7 mmol/L — ABNORMAL HIGH (ref 3.5–5.1)
Sodium: 147 mmol/L — ABNORMAL HIGH (ref 135–145)

## 2022-05-27 LAB — BLOOD GAS, ARTERIAL
Acid-base deficit: 12.3 mmol/L — ABNORMAL HIGH (ref 0.0–2.0)
Acid-base deficit: 19.4 mmol/L — ABNORMAL HIGH (ref 0.0–2.0)
Acid-base deficit: 11.3 mmol/L — ABNORMAL HIGH (ref 0.0–2.0)
Bicarbonate: 17.7 mmol/L — ABNORMAL LOW (ref 20.0–28.0)
Bicarbonate: 11.4 mmol/L — ABNORMAL LOW (ref 20.0–28.0)
Bicarbonate: 16.8 mmol/L — ABNORMAL LOW (ref 20.0–28.0)
Drawn by: 331471
Drawn by: 54092
FIO2: 50 %
FIO2: 5 %
FIO2: 50 %
MECHVT: 460 mL
MECHVT: 460 mL
MECHVT: 460 mL
O2 Saturation: 97.3 %
O2 Saturation: 98.4 %
O2 Saturation: 98.3 %
PEEP: 8 cmH2O
PEEP: 8 cmH2O
PEEP: 8 cmH2O
Patient temperature: 36.9
Patient temperature: 36.1
Patient temperature: 36.1
RATE: 20 resp/min
RATE: 30 resp/min
RATE: 35 resp/min
pCO2 arterial: 57 mmHg — ABNORMAL HIGH (ref 32–48)
pCO2 arterial: 43 mmHg (ref 32–48)
pCO2 arterial: 43 mmHg (ref 32–48)
pH, Arterial: 7.1 — CL (ref 7.35–7.45)
pH, Arterial: 7.02 — CL (ref 7.35–7.45)
pH, Arterial: 7.19 — CL (ref 7.35–7.45)
pO2, Arterial: 117 mmHg — ABNORMAL HIGH (ref 83–108)
pO2, Arterial: 175 mmHg — ABNORMAL HIGH (ref 83–108)
pO2, Arterial: 167 mmHg — ABNORMAL HIGH (ref 83–108)

## 2022-05-27 LAB — APTT
aPTT: 37 seconds — ABNORMAL HIGH (ref 24–36)
aPTT: 52 seconds — ABNORMAL HIGH (ref 24–36)

## 2022-05-27 LAB — ECHOCARDIOGRAM COMPLETE
AR max vel: 1.93 cm2
AV Area VTI: 1.89 cm2
AV Area mean vel: 1.8 cm2
AV Mean grad: 7 mmHg
AV Peak grad: 14.4 mmHg
Ao pk vel: 1.9 m/s
Area-P 1/2: 2.82 cm2
Calc EF: 52.5 %
Height: 70 in
S' Lateral: 2.7 cm
Single Plane A2C EF: 62.3 %
Single Plane A4C EF: 44.2 %
Weight: 2860.69 oz

## 2022-05-27 LAB — GLUCOSE, CAPILLARY
Glucose-Capillary: 125 mg/dL — ABNORMAL HIGH (ref 70–99)
Glucose-Capillary: 134 mg/dL — ABNORMAL HIGH (ref 70–99)
Glucose-Capillary: 261 mg/dL — ABNORMAL HIGH (ref 70–99)

## 2022-05-27 LAB — TRIGLYCERIDES: Triglycerides: 131 mg/dL (ref <150)

## 2022-05-27 LAB — TROPONIN I (HIGH SENSITIVITY): Troponin I (High Sensitivity): >24000 ng/L (ref <18)

## 2022-05-27 LAB — CBC
HCT: 29.5 % — ABNORMAL LOW (ref 39.0–52.0)
Hemoglobin: 9.1 g/dL — ABNORMAL LOW (ref 13.0–17.0)
MCH: 31.2 pg (ref 26.0–34.0)
MCHC: 30.8 g/dL (ref 30.0–36.0)
MCV: 101 fL — ABNORMAL HIGH (ref 80.0–100.0)
Platelets: 256 10*3/uL (ref 150–400)
RBC: 2.92 MIL/uL — ABNORMAL LOW (ref 4.22–5.81)
RDW: 15.1 % (ref 11.5–15.5)
WBC: 19.8 10*3/uL — ABNORMAL HIGH (ref 4.0–10.5)
nRBC: 1.1 % — ABNORMAL HIGH (ref 0.0–0.2)

## 2022-05-27 LAB — HEPARIN LEVEL (UNFRACTIONATED): Heparin Unfractionated: 0.37 IU/mL (ref 0.30–0.70)

## 2022-05-27 LAB — LEGIONELLA PNEUMOPHILA SEROGP 1 UR AG: L. pneumophila Serogp 1 Ur Ag: NEGATIVE

## 2022-05-27 MED ORDER — DEXTROSE 50 % IV SOLN
12.5000 g | INTRAVENOUS | Status: AC
  Administered 2022-05-27: 12.5 g via INTRAVENOUS

## 2022-05-27 MED ORDER — ASPIRIN 325 MG PO TABS
325.0000 mg | ORAL_TABLET | Freq: Every day | ORAL | Status: DC
  Administered 2022-05-27 – 2022-05-28 (×2): 325 mg
  Filled 2022-05-27 (×2): qty 1

## 2022-05-27 MED ORDER — NOREPINEPHRINE 4 MG/250ML-% IV SOLN
INTRAVENOUS | Status: AC
  Filled 2022-05-27: qty 250

## 2022-05-27 MED ORDER — "THROMBI-PAD 3""X3"" EX PADS"
1.0000 | MEDICATED_PAD | Freq: Once | CUTANEOUS | Status: AC
  Administered 2022-05-27: 1 via TOPICAL
  Filled 2022-05-27: qty 1

## 2022-05-27 MED ORDER — PROPOFOL 1000 MG/100ML IV EMUL: 5.0000 ug/kg/min | INTRAVENOUS | Status: DC

## 2022-05-27 MED ORDER — NOREPINEPHRINE 4 MG/250ML-% IV SOLN
2.0000 ug/min | INTRAVENOUS | Status: DC
  Administered 2022-05-27: 3 ug/min via INTRAVENOUS
  Filled 2022-05-27: qty 250

## 2022-05-27 MED ORDER — CLOPIDOGREL BISULFATE 75 MG PO TABS
75.0000 mg | ORAL_TABLET | Freq: Every day | ORAL | Status: DC
  Administered 2022-05-27 – 2022-05-28 (×2): 75 mg via NASOGASTRIC
  Filled 2022-05-27 (×2): qty 1

## 2022-05-27 MED ORDER — HEPARIN SODIUM (PORCINE) 1000 UNIT/ML DIALYSIS
1000.0000 [IU] | INTRAMUSCULAR | Status: DC | PRN
  Administered 2022-05-27: 3000 [IU] via INTRAVENOUS_CENTRAL
  Filled 2022-05-27 (×2): qty 6

## 2022-05-27 MED ORDER — SODIUM BICARBONATE 8.4 % IV SOLN
INTRAVENOUS | Status: AC
  Filled 2022-05-27: qty 50

## 2022-05-27 MED ORDER — SODIUM BICARBONATE 8.4 % IV SOLN
INTRAVENOUS | Status: AC
  Filled 2022-05-27: qty 100

## 2022-05-27 MED ORDER — HEPARIN BOLUS VIA INFUSION
4000.0000 [IU] | Freq: Once | INTRAVENOUS | Status: AC
  Administered 2022-05-27: 4000 [IU] via INTRAVENOUS
  Filled 2022-05-27: qty 4000

## 2022-05-27 MED ORDER — SODIUM ZIRCONIUM CYCLOSILICATE 10 G PO PACK
10.0000 g | PACK | Freq: Once | ORAL | Status: AC
  Administered 2022-05-27: 10 g
  Filled 2022-05-27: qty 1

## 2022-05-27 MED ORDER — HEPARIN (PORCINE) 25000 UT/250ML-% IV SOLN
1150.0000 [IU]/h | INTRAVENOUS | Status: DC
  Administered 2022-05-27: 1000 [IU]/h via INTRAVENOUS
  Administered 2022-05-28: 1150 [IU]/h via INTRAVENOUS
  Filled 2022-05-27 (×2): qty 250

## 2022-05-27 MED ORDER — SODIUM CHLORIDE 0.9 % IV SOLN: 250.0000 mL | INTRAVENOUS | Status: DC

## 2022-05-27 MED ORDER — DEXTROSE 50 % IV SOLN
50.0000 mL | Freq: Once | INTRAVENOUS | Status: AC
  Administered 2022-05-27: 50 mL via INTRAVENOUS
  Filled 2022-05-27: qty 50

## 2022-05-27 MED ORDER — ORAL CARE MOUTH RINSE
15.0000 mL | OROMUCOSAL | Status: DC
  Administered 2022-05-27 – 2022-05-28 (×8): 15 mL via OROMUCOSAL

## 2022-05-27 MED ORDER — AMLODIPINE BESYLATE 10 MG PO TABS
10.0000 mg | ORAL_TABLET | Freq: Every day | ORAL | Status: DC
  Filled 2022-05-27: qty 1

## 2022-05-27 MED ORDER — CALCIUM GLUCONATE-NACL 1-0.675 GM/50ML-% IV SOLN
1.0000 g | Freq: Once | INTRAVENOUS | Status: AC
  Administered 2022-05-27: 1000 mg via INTRAVENOUS
  Filled 2022-05-27: qty 50

## 2022-05-27 MED ORDER — SODIUM CHLORIDE 0.9 % IV SOLN
2.0000 g | Freq: Two times a day (BID) | INTRAVENOUS | Status: DC
  Administered 2022-05-27 – 2022-05-28 (×3): 2 g via INTRAVENOUS
  Filled 2022-05-27 (×3): qty 12.5

## 2022-05-27 MED ORDER — SODIUM BICARBONATE 8.4 % IV SOLN
100.0000 meq | Freq: Once | INTRAVENOUS | Status: AC
  Administered 2022-05-27: 100 meq via INTRAVENOUS
  Filled 2022-05-27: qty 50

## 2022-05-27 MED ORDER — ORAL CARE MOUTH RINSE: 15.0000 mL | OROMUCOSAL | Status: DC | PRN

## 2022-05-27 MED ORDER — PHENYLEPHRINE 80 MCG/ML (10ML) SYRINGE FOR IV PUSH (FOR BLOOD PRESSURE SUPPORT)
PREFILLED_SYRINGE | INTRAVENOUS | Status: AC
  Administered 2022-05-27: 160 ug
  Filled 2022-05-27: qty 10

## 2022-05-27 MED ORDER — SODIUM BICARBONATE 8.4 % IV SOLN
100.0000 meq | Freq: Once | INTRAVENOUS | Status: AC
  Administered 2022-05-27: 100 meq via INTRAVENOUS
  Filled 2022-05-27: qty 100

## 2022-05-27 MED ORDER — NOREPINEPHRINE 4 MG/250ML-% IV SOLN: 0.0000 ug/min | INTRAVENOUS | Status: DC

## 2022-05-27 MED ORDER — SODIUM BICARBONATE 8.4 % IV SOLN
INTRAVENOUS | Status: AC
  Administered 2022-05-27: 50 meq
  Filled 2022-05-27: qty 50

## 2022-05-27 MED ORDER — VASOPRESSIN 20 UNITS/100 ML INFUSION FOR SHOCK
0.0000 [IU]/min | INTRAVENOUS | Status: DC
  Administered 2022-05-27 – 2022-05-28 (×3): 0.03 [IU]/min via INTRAVENOUS
  Filled 2022-05-27 (×3): qty 100

## 2022-05-27 MED ORDER — NOREPINEPHRINE 16 MG/250ML-% IV SOLN
0.0000 ug/min | INTRAVENOUS | Status: DC
  Administered 2022-05-27: 35 ug/min via INTRAVENOUS
  Administered 2022-05-27: 18 ug/min via INTRAVENOUS
  Administered 2022-05-28: 32 ug/min via INTRAVENOUS
  Filled 2022-05-27 (×3): qty 250

## 2022-05-27 MED ORDER — STERILE WATER FOR INJECTION IV SOLN
INTRAVENOUS | Status: DC
  Filled 2022-05-27: qty 150

## 2022-05-27 MED ORDER — STERILE WATER FOR INJECTION IJ SOLN
INTRAMUSCULAR | Status: AC
  Filled 2022-05-27: qty 10

## 2022-05-27 MED ORDER — INSULIN ASPART 100 UNIT/ML IJ SOLN
10.0000 [IU] | Freq: Once | INTRAMUSCULAR | Status: AC
  Administered 2022-05-27: 10 [IU] via INTRAVENOUS

## 2022-05-27 MED ORDER — HYDRALAZINE HCL 50 MG PO TABS
50.0000 mg | ORAL_TABLET | Freq: Three times a day (TID) | ORAL | Status: DC
  Filled 2022-05-27: qty 1

## 2022-05-27 MED ORDER — PRISMASOL BGK 0/2.5 32-2.5 MEQ/L EC SOLN
Status: DC
  Filled 2022-05-27 (×3): qty 5000

## 2022-05-27 MED ORDER — DEXTROSE 50 % IV SOLN
INTRAVENOUS | Status: AC
  Filled 2022-05-27: qty 50

## 2022-05-27 MED ORDER — STERILE WATER FOR INJECTION IV SOLN
INTRAVENOUS | Status: DC
  Filled 2022-05-27 (×6): qty 150

## 2022-05-27 MED ORDER — PRISMASOL BGK 0/2.5 32-2.5 MEQ/L EC SOLN
Status: DC
  Filled 2022-05-27 (×10): qty 5000

## 2022-05-27 MED ORDER — ACETAMINOPHEN 325 MG PO TABS: 650.0000 mg | ORAL_TABLET | ORAL | Status: DC | PRN

## 2022-05-27 MED ORDER — FUROSEMIDE 10 MG/ML IJ SOLN
40.0000 mg | Freq: Two times a day (BID) | INTRAMUSCULAR | Status: DC
  Administered 2022-05-27: 40 mg via INTRAVENOUS
  Filled 2022-05-27: qty 4

## 2022-05-27 MED ORDER — HEPARIN (PORCINE) 2000 UNITS/L FOR CRRT: INTRAVENOUS_CENTRAL | Status: DC | PRN

## 2022-05-27 NOTE — Consult Note (Addendum)
Cardiology Consultation   Patient ID: Samuel [name] MRN: [id number]; DOB: August 26, 1945  Admit date: 05/16/2022 Date of Consult: 05/27/2022  PCP:  [name], [facility] Cardiologist:  Fransico Him, MD        Patient Profile:   Samuel [name] is a 76 y.o. male with a hx of  PAD, LSFA stent 2012, RCEA 2006, HTN, HLD, mesenteric ischemia with stent to SMA and endovascular abd. aneurysm repair in 2020,  who is being seen 05/27/2022 for the evaluation of episodic acute desat now back on vent and abnormal EKG at the request of Dr. Elsworth Soho.  History of Present Illness:   [name] with above hx was hospitalized 05/14/2022 to 05/20/22 with PNA requiring BiPAP and high flow 02.  He also had a fib with RVR though did convert.   He had elevated troponin with flat trend not c/w ACS.  No chest pain [facility]  He had Nuc study done 12/17/21 that was neg for ischemia.   During the hospitalization he developed new onset atrial fib  pt was unaware of rapid HR, with CHA2DS2VASC of 4.  He was placed on eliquis with plan for event monitor as outpt.  His BB was changed to Bisoprolol   On 06/09/2022 EMS brought to [facility] for Pt was on home 02 at 6L and sp02 was 70%, placed on NRB, he was in [place].  Concern for ARDS, possible healthcare associated PNA, AI HTN and acute on chronic anemia.  He was intubated.  By Sat the 16th was extubated and did well, then need for BiPap at night    Yesterday evening he was in respiratory distress placed on Bipap and precedex restarted for anxiety and IV lasix given --CXR with worsening bilateral infiltrates.  He was intubated   This AM with fentanyl BP dropped to 58/30 P 50 now improved after beginning norepinephrine  Bloody sputum on admit and anticoagulation held and pt maintained SR.   ABGs at 50% pH 7.1 pC02 57 p02 117 acid base 12.3 bicarb 17.7  WBC 19.8 Hgb 9.1 plts 256  BMP pending Hs troponin >24,000  BNP yesterday  2,110   EKG:  The EKG was personally reviewed and demonstrates:  SR at 98 with deeper T wave inversions in lat leads, and similar in inf leads Telemetry:  Telemetry was personally reviewed and demonstrates:  SR BP 150/47 P 76 R 30 afebrile   Past Medical History:  Diagnosis Date   AAA (abdominal aortic aneurysm) ([place])    Carotid artery occlusion    Carotid artery stenosis 05/14/2022   Jun 05, 2019 Entered By: Ria Clock Comment: Right carotid endarterectomy.   Essential hypertension 10/16/2007   Qualifier: Diagnosis of   By: Danelle Earthly CMA, Darlene       Genital warts    history of   Glucose intolerance 05/14/2022   History of carotid stenosis    s/p right carotid endarterectomy 12/06   Hyperlipidemia    Hypertension    PAD (peripheral artery disease) (HCC)    Left leg   Peripheral vascular disease ([place]) 10/16/2007   Centricity Description: INTERMITTENT CLAUDICATION, LEFT LEG  Qualifier: Diagnosis of   By: Danelle Earthly CMA, Darlene      Centricity Description: UNSPECIFIED PERIPHERAL VASCULAR DISEASE  Qualifier: Diagnosis of   By: Wynona Luna       Past Surgical History:  Procedure Laterality Date   ABDOMINAL AORTIC ENDOVASCULAR STENT GRAFT N/A  11/19/2018   Procedure: ABDOMINAL AORTIC ENDOVASCULAR STENT GRAFT;  Surgeon: Waynetta Sandy, MD;  Location: [facility];  Service: Vascular;  Laterality: N/A;   [place]  05/2005   right   COLONOSCOPY     KNEE SURGERY  1962   Left Leg stents  2012   VISCERAL ANGIOGRAPHY N/A 06/21/2019   Procedure: VISCERAL ANGIOGRAPHY;  Surgeon: Waynetta Sandy, MD;  Location: [facility];  Service: Cardiovascular;  Laterality: N/A;   VISCERAL ARTERY INTERVENTION N/A 06/21/2019   Procedure: VISCERAL ARTERY INTERVENTION;  Surgeon: Waynetta Sandy, MD;  Location: [facility];  Service: Cardiovascular;  Laterality: N/A;  Stent to SMA     Home Medications:  Prior to Admission  medications   Medication Sig Start Date End Date Taking? Authorizing Provider  amLODipine (NORVASC) 10 MG tablet Take 1 tablet (10 mg total) by mouth daily. 05/21/22  Yes Kathie Dike, MD  apixaban (ELIQUIS) 5 MG TABS tablet Take 1 tablet (5 mg total) by mouth 2 (two) times daily. 05/18/22 08/16/22 Yes Ghimire, Dante Gang, MD  bisoprolol (ZEBETA) 10 MG tablet Take 1 tablet (10 mg total) by mouth daily. 05/18/22 06/17/22 Yes Ghimire, Dante Gang, MD  Cholecalciferol (VITAMIN D3) 125 MCG (5000 UT) CHEW Chew 5,000 Units by mouth daily after breakfast.   Yes [provider]  clopidogrel (PLAVIX) 75 MG tablet TAKE 1 TABLET BY MOUTH EVERY DAY Patient taking differently: Take 75 mg by mouth in the morning. 08/16/20  Yes Waynetta Sandy, MD  hydrALAZINE (APRESOLINE) 100 MG tablet Take 1 tablet (100 mg total) by mouth 3 (three) times daily. 05/18/22 06/17/22 Yes Ghimire, Dante Gang, MD  ipratropium-albuterol (DUONEB) 0.5-2.5 (3) MG/3ML SOLN Take 3 mLs by nebulization every 6 (six) hours as needed. Patient taking differently: Take 3 mLs by nebulization in the morning, at noon, and at bedtime. 05/20/22  Yes Kathie Dike, MD  NON FORMULARY Take 1 capsule by mouth See admin instructions. Beet plus capsules- Take 1 capsule by mouth once a day   Yes [provider]  NON FORMULARY Take 1 capsule by mouth See admin instructions. Juice Plus capsules- Take 1 capsule by mouth once a day   Yes [provider]  sodium chloride (MURO 128) 5 % ophthalmic solution Place 1 drop into both eyes See admin instructions. Instill 1 drop into both eyes in the morning, afternoon, and evening   Yes [provider]  tamsulosin (FLOMAX) 0.4 MG CAPS capsule Take 0.4 mg by mouth in the morning.   Yes [provider]  fluocinonide cream (LIDEX) 9.62 % Apply 1 Application topically 2 (two) times daily as needed (to affected areas for itching/swelling). Patient not taking: Reported on 05/11/2022    [provider]  sildenafil (VIAGRA) 100 MG tablet Take 100 mg by mouth daily as needed for erectile dysfunction (one hour prior to activity- max of 1 tablet/day). Patient not taking: Reported on 06/05/2022    [provider]    Inpatient Medications: Scheduled Meds:  amLODipine  10 mg Per Tube Daily   aspirin  325 mg Per Tube Daily   Chlorhexidine Gluconate Cloth  6 each Topical Daily   dexamethasone (DECADRON) injection  10 mg Intravenous Q12H   famotidine  20 mg Per Tube QHS   furosemide  40 mg Intravenous Q12H   hydrALAZINE  50 mg Per Tube TID   pantoprazole (PROTONIX) IV  40 mg Intravenous Daily   polyethylene glycol  17  g Per Tube Daily   Continuous Infusions:  sodium chloride Stopped (05/24/22 1946)   sodium chloride Stopped (05/27/22 0906)   ceFEPime (MAXIPIME) IV Stopped (05/26/22 1749)   lactated ringers Stopped (06/04/2022 2349)   norepinephrine (LEVOPHED) Adult infusion 35 mcg/min (05/27/22 1135)   propofol (DIPRIVAN) infusion Stopped (05/27/22 0900)   vasopressin 0.03 Units/min (05/27/22 1129)   PRN Meds: sodium chloride, acetaminophen, docusate sodium, fentaNYL (SUBLIMAZE) injection, fentaNYL (SUBLIMAZE) injection, hydrALAZINE, ondansetron (ZOFRAN) IV, mouth rinse, polyethylene glycol  Allergies:    Allergies  Allergen Reactions   Diclofenac Diarrhea   Lipitor [Atorvastatin] Other (See Comments)    Muscle pain   Livalo [Pitavastatin] Other (See Comments)    Muscle aches   Statins     Muscle aches    Social History:   Social History   Socioeconomic History   Marital status: Married    Spouse name: Not on file   Number of children: Not on file   Years of education: Not on file   Highest education level: Not on file  Occupational History    Employer: RETIRED    Comment: self employed - [employer]  Tobacco Use   Smoking status: Former   Smokeless tobacco: Never   Tobacco comments:    quit- for porlonged periods he has smoked 2-3 packs a day   Vaping Use   Vaping Use: Never used  Substance and Sexual Activity   Alcohol use: Yes    Alcohol/week: 24.0 standard drinks of alcohol    Types: 24 Cans of beer per week   Drug use: No   Sexual activity: Not on file  Other Topics Concern   Not on file  Social History Narrative   Married -to his second wife Idamae Schuller).  Has a daughter from his first marriage who is in her early 24s.     He drinks approximately 4 alcoholic beverages, beer, a day.  He has done so since age 77.  Denies any binge drinking or heavy alcohol use.  Quit tobacco 2 year ago.  He has smoked on and off x20-30 years.  For prolonged periods he has smoked 2-3 packs a day.     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  She lives in [place], [place]   Social Determinants of Health   Financial Resource Strain: Not on file  Food Insecurity: No Food Insecurity (05/24/2022)   Hunger Vital Sign    Worried About Running Out of Food in the Last Year: Never true    Ran Out of Food in the Last Year: Never true  Transportation Needs: No Transportation Needs (05/24/2022)   PRAPARE - Hydrologist (Medical): No    Lack of Transportation (Non-Medical): No  Physical Activity: Not on file  Stress: Not on file  Social Connections: Not on file  Intimate Partner Violence: Not At Risk (05/14/2022)   Humiliation, Afraid, Rape, and Kick questionnaire    Fear of Current or Ex-Partner: No    Emotionally Abused: No    Physically Abused: No    Sexually Abused: No    Family History:    Family History  Problem Relation Age of Onset   Stroke Mother        deceased age 46 had her firsrt CVA age  58   Hyperlipidemia Mother    Peripheral vascular disease Father        status post leg amputation  secondary  to PVD   Hyperlipidemia Father    Peripheral vascular disease Brother    Colon cancer Neg Hx    Esophageal cancer Neg Hx    Prostate cancer Neg Hx    Rectal  cancer Neg Hx    Stomach cancer Neg Hx      ROS:  Please see the history of present illness.  General:no colds or fevers, no weight changes Skin:no rashes or ulcers HEENT:no blurred vision, no congestion CV:see HPI PUL:see HPI GI:no diarrhea constipation or melena, no indigestion GU:no hematuria, no dysuria MS:no joint pain, no claudication Neuro:no syncope, no lightheadedness Endo:no diabetes, no thyroid disease  All other ROS reviewed and negative.     Physical Exam/Data:   Vitals:   05/27/22 1009 05/27/22 1012 05/27/22 1018 05/27/22 1100  BP: (!) 144/39 (!) 140/44 (!) 115/42 (!) 150/47  Pulse: 68 72  77  Resp: (!) 25 (!) 28 (!) 30 (!) 29  Temp: (!) 96.3 F (35.7 C) (!) 96.3 F (35.7 C) (!) 96.3 F (35.7 C) (!) 95.7 F (35.4 C)  TempSrc:      SpO2: 98% 97%  99%  Weight:      Height:        Intake/Output Summary (Last 24 hours) at 05/27/2022 1140 Last data filed at 05/27/2022 1029 Gross per 24 hour  Intake 682.35 ml  Output 1800 ml  Net -1117.65 ml      05/27/2022    5:00 AM 05/25/2022    5:00 AM 05/17/2022    6:14 PM  Last 3 Weights  Weight (lbs) 178 lb 12.7 oz 181 lb 10.5 oz 174 lb 9.7 oz  Weight (kg) 81.1 kg 82.4 kg 79.2 kg     Body mass index is 25.65 kg/m.  General:  Well nourished, well developed, in no acute distress sedated on vent HEENT: normal Neck: no JVD Vascular: No carotid bruits; Distal pulses 2+ bilaterally Cardiac:  normal S1, S2; RRR; no murmur gallup rub or click Lungs:  + rhonchi to auscultation bilaterally, no wheezing, some rales  Abd: soft, nontender, no hepatomegaly  Ext: no edema Musculoskeletal:  No deformities, BUE and BLE strength normal and equal Skin: warm and dry  Neuro:  sedated on vent, no focal abnormalities noted Psych:  Normal affect    Relevant CV Studies:  Renal artery U/S  05/20/22 Renal:    Right: 1-59% stenosis of the right renal artery. Abnormal right         Resistive Index. Normal size right  kidney. RRV flow present.  Left:  1-59% stenosis of the left renal artery. Normal left         Resistive Index. Normal size of left kidney. LRV flow         present.  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       Echo 05/15/22 IMPRESSIONS     1. Left ventricular ejection fraction, by estimation, is 60 to 65%. The  left ventricle has normal function. The left ventricle has no regional  wall motion abnormalities. There is moderate concentric left ventricular  hypertrophy. Left ventricular  diastolic parameters are consistent with Grade II diastolic dysfunction  (pseudonormalization).   2. Right ventricular systolic function is normal. The right ventricular  size is normal. There is normal pulmonary artery systolic pressure. The  estimated right ventricular systolic pressure is 42.6 mmHg.   3. Left atrial size was mildly dilated.   4. Right atrial size was mildly  dilated.   5. The mitral valve is normal in structure. No evidence of mitral valve  regurgitation. No evidence of mitral stenosis.   6. The aortic valve is tricuspid. There is moderate calcification of the  aortic valve. Aortic valve regurgitation is not visualized. Mild aortic  valve stenosis. Aortic valve mean gradient measures 12.0 mmHg.   7. The inferior vena cava is normal in size with greater than 50%  respiratory variability, suggesting right atrial pressure of 3 mmHg.   FINDINGS   Left Ventricle: Left ventricular ejection fraction, by estimation, is 60  to 65%. The left ventricle has normal function. The left ventricle has no  regional wall motion abnormalities. The left ventricular internal cavity  size was normal in size. There is   moderate concentric left ventricular hypertrophy. Left ventricular  diastolic parameters are consistent with Grade II diastolic dysfunction  (pseudonormalization).   Right Ventricle: The right ventricular size is normal. No increase in   right ventricular wall thickness. Right ventricular systolic function is  normal. There is normal pulmonary artery systolic pressure. The tricuspid  regurgitant velocity is 2.64 m/s, and   with an assumed right atrial pressure of 3 mmHg, the estimated right  ventricular systolic pressure is 93.2 mmHg.   Left Atrium: Left atrial size was mildly dilated.   Right Atrium: Right atrial size was mildly dilated.   Pericardium: There is no evidence of pericardial effusion.   Mitral Valve: The mitral valve is normal in structure. Mild mitral annular  calcification. No evidence of mitral valve regurgitation. No evidence of  mitral valve stenosis. MV peak gradient, 9.0 mmHg. The mean mitral valve  gradient is 3.0 mmHg.   Tricuspid Valve: The tricuspid valve is normal in structure. Tricuspid  valve regurgitation is trivial.   Aortic Valve: The aortic valve is tricuspid. There is moderate  calcification of the aortic valve. Aortic valve regurgitation is not  visualized. Mild aortic stenosis is present. Aortic valve mean gradient  measures 12.0 mmHg. Aortic valve peak gradient  measures 20.3 mmHg. Aortic valve area, by VTI measures 2.16 cm.   Pulmonic Valve: The pulmonic valve was normal in structure. Pulmonic valve  regurgitation is not visualized.   Aorta: The aortic root is normal in size and structure.   Venous: The inferior vena cava is normal in size with greater than 50%  respiratory variability, suggesting right atrial pressure of 3 mmHg.   IAS/Shunts: No atrial level shunt detected by color flow Doppler.    Echo from 09/18/21  at [place]   Name: GIANI, BETZOLD Study Date: 09/18/2021 08:31 AM Height: 70 in MRN: [id number] Patient Location: [facility]/MED/CP/CARDIO/ECHO/01 Weight: 184 lb DOB: [birth year] Gender: Male BSA: 2.0 m2 Age: 25 yrs BP: 170/88 mmHg Reason For Study: Murmur   CP Order Number: [id number] Referring Physician: [name]  Interpretation Summary The  left ventricle is normal in size. There is mild concentric left ventricular hypertrophy. EF= 57.3% by 2D biplane method. The right ventricle is normal in size and function. The left atrial size is normal. The aortic valve is trileaflet. Moderate thickening of the right and non-coronary cusps. No hemodynamically significant valvular aortic stenosis. There is mild mitral annular calcification. There is trace to mild mitral regurgitation. There is mild tricuspid regurgitation. Right ventricular systolic pressure is normal.  Left Ventricle: The left ventricle is normal in size. There is mild concentric left ventricular hypertrophy. EF= 57.3% by 2D biplane method.  Right Ventricle: The right ventricle is normal in size and function.  Left Atrium The left atrial size is normal.  Right Atrium: Right atrial size is normal.  Aortic Valve: The aortic valve is trileaflet. Moderate thickening of the right and non-coronary cusps. No hemodynamically significant valvular aortic stenosis.  Pulmonic Valve: The pulmonic valve is grossly normal as visualized. Trace to mild pulmonic valvular regurgitation.  Mitral Valve: There is mild mitral annular calcification. There is trace to mild mitral regurgitation.  Tricuspid Valve: The tricuspid valve is grossly normal. There is mild tricuspid regurgitation. Right ventricular systolic pressure is normal.  Venous: The IVC is normal in size with an inspiratory collapse of greater then 50%, suggesting normal right atrial pressure.  Great Vessels: The aortic root is normal size.  Pericardium/Pleural: There is no pericardial effusion.     Nuclear stress test 12/17/21  done at the [facility] [phone] DOB-Jan 26, 1946 M Exm Date: Dec 25, 2021'@08'$ :00 Req Phys: Delma Post Loc: [facility]/MED/CARDIO/NUC/STRESS ([place] [facility] Service: Unknown    (Case [id number] COMPLETE) MYOCARDIAL PERFUSION(SPECT)EX.RED(NM Detailed)  TFT:73220 Reason for Study: Hypertensive Heart Disease without Heart  (Case [id number] COMPLETE) TC99M TETROFOSMIN (NM Detailed) GBT:[id number]  (Case [id number] COMPLETE) TC99M TETROFOSMIN-2 (NM Detailed) [id number]  Clinical History:  Report Status: Verified Date Reported: Dec 25, 2021 Date Verified: Dec 25, 2021 Verifier E-Sig:/ES/JULIE A MOYERS, MD  Report: History: Hypertensive Heart Disease without Heart  Comparison: None  Findings: Myocardial perfusion study was performed. Images were interpreted on the cardiac workstation.  Pharmacologic stress test was performed under the supervision of the cardiologist. Please see cardiologist notes for details.  Rest images were obtained utilizing 1.6 mCi of technetium 84mMyoview IV. Stress images were obtained utilizing 32.8 mCi of technetium 962myoview IV.  No fixed defects or areas of reversible ischemia are seen.  TID is 1.06.  Ejection fraction is calculated at 63% during stress.  Calculated end diastolic volume is 11500L.  --------------------------------------------------------    Impression:   No evidence of reversible ischemia.   Laboratory Data:  High Sensitivity Troponin:   Recent Labs  Lab 05/14/22 2115 05/15/22 0722 06/02/2022 1520 05/26/2022 1713 05/27/22 1039  TROPONINIHS 386* 348* 66* 84* >24,000*     Chemistry Recent Labs  Lab 05/24/22 0231 05/24/22 1301 05/24/22 1938 05/25/22 0614 05/26/22 0745  NA 141 139  --  138 144  K 5.3* 5.1  --  4.3 4.5  CL 108 109  --  107 113*  CO2 19* 17*  --  19* 19*  GLUCOSE 151* 139*  --  181* 160*  BUN 93* 108*  --  115* 123*  CREATININE 3.33* 3.61*  --  3.34* 3.14*  CALCIUM 9.0 8.3*  --  8.7* 8.6*  MG 2.5*  --  2.8* 3.0*  --   GFRNONAA 18* 17*  --  18* 20*  ANIONGAP 14 13  --  12 12    Recent Labs  Lab 05/20/2022 1520  PROT 7.2  ALBUMIN 3.3*  AST 36  ALT 40  ALKPHOS 59  BILITOT 0.8   Lipids  Recent Labs  Lab 05/24/22 0231  TRIG 86     Hematology Recent Labs  Lab 05/25/22 0614 05/26/22 0745 05/27/22 0754  WBC 10.2 8.6 19.8*  RBC 2.79* 2.57* 2.92*  HGB 8.8* 8.1* 9.1*  HCT 27.2* 24.5* 29.5*  MCV 97.5 95.3 101.0*  MCH 31.5 31.5 31.2  MCHC 32.4 33.1 30.8  RDW 15.1 14.9 15.1  PLT 262 241 256   Thyroid No results for input(s): "TSH", "FREET4" in the  last 168 hours.  BNP Recent Labs  Lab 06/09/2022 1815 05/26/22 0745  BNP 1,240.2* 2,110.0*    DDimer No results for input(s): "DDIMER" in the last 168 hours.   Radiology/Studies:  DG Chest Port 1 View  Result Date: 05/27/2022 CLINICAL DATA:  Acute respiratory failure.  Respiratory distress. EXAM: PORTABLE CHEST 1 VIEW COMPARISON:  Chest x-rays dated 05/26/2022 and 05/25/2022. Chest CT dated 05/14/2022. FINDINGS: Endotracheal tube is stable in position with tip approximately 6 cm above the level of the carina. Enteric tube passes into the stomach. Diffuse bilateral airspace opacities, predominantly interstitial, are not significantly changed compared to yesterday's most recent chest x-ray, questionably decreased compared to yesterday's morning chest x-ray. No new lung findings. No pneumothorax is seen. Heart size and mediastinal contours appear stable. IMPRESSION: 1. Diffuse bilateral airspace opacities, predominantly interstitial, compatible with bilateral multifocal pneumonia, not significantly changed. 2. No new lung findings. 3. Support apparatus appears stable in position. Electronically Signed   By: Franki Cabot M.D.   On: 05/27/2022 08:13   Portable Chest x-ray  Result Date: 05/26/2022 CLINICAL DATA:  Endotracheal tube EXAM: PORTABLE CHEST 1 VIEW COMPARISON:  Chest x-ray 05/26/2022 FINDINGS: Endotracheal tube tip is proximally 6 cm above the carina. Enteric tube extends below the diaphragm with distal tip in the mid stomach. Cardiomediastinal silhouette is within normal limits. Diffuse bilateral airspace opacities in the mid and lower lungs have mildly decreased. No  pleural effusion or pneumothorax. No acute fractures are seen. IMPRESSION: 1. Endotracheal tube tip is 6 cm above the carina. 2. Enteric tube tip in the mid stomach. 3. Diffuse bilateral airspace opacities in the mid and lower lungs have mildly decreased. Electronically Signed   By: Ronney Asters M.D.   On: 05/26/2022 19:20   DG CHEST PORT 1 VIEW  Result Date: 05/26/2022 CLINICAL DATA:  Hypoxia EXAM: PORTABLE CHEST 1 VIEW COMPARISON:  05/25/2022, chest CT 05/14/2022, radiograph 05/24/2022, 05/27/2022 FINDINGS: Cardiomegaly. Widespread heterogeneous bilateral airspace disease appears slightly worse compared to yesterday's radiograph. Bilateral effusions. No pneumothorax. IMPRESSION: Widespread heterogeneous bilateral airspace disease, slightly worse compared to yesterday's radiograph. Bilateral effusions and cardiomegaly otherwise unchanged. Electronically Signed   By: Donavan Foil M.D.   On: 05/26/2022 18:31   DG CHEST PORT 1 VIEW  Result Date: 05/25/2022 CLINICAL DATA:  Productive cough EXAM: PORTABLE CHEST 1 VIEW COMPARISON:  05/24/2022, chest CT 05/14/2022 FINDINGS: The previously noted endotracheal and esophageal tubes are no longer visualized. Enlarged cardiomediastinal silhouette. Widespread heterogeneous interstitial and ground-glass opacity slightly improved on the right compared with yesterday's radiograph. Small pleural effusions. No visible pneumothorax. IMPRESSION: 1. Widespread heterogeneous interstitial and ground-glass opacity, slightly improved on the right compared with yesterday's radiograph. Small pleural effusions. 2. Enlarged cardiomediastinal silhouette. 3. Previously noted endotracheal and esophageal tubes are no longer visualized Electronically Signed   By: Donavan Foil M.D.   On: 05/25/2022 15:55   DG CHEST PORT 1 VIEW  Result Date: 05/24/2022 CLINICAL DATA:  81856 with ARDS. EXAM: PORTABLE CHEST 1 VIEW COMPARISON:  Portable chest yesterday at 6:43 p.m. FINDINGS: 4:17 a.m.  ETT tip is 3.4 cm from the carina. NGT appears to extend into the duodenum, with the side hole and tip not included in the exam. There is mild cardiomegaly, mild central vascular prominence. Diffuse interstitial and airspace opacities are again noted with relative apical sparing, small pleural effusions. The opacities are slightly less dense today, minimally less confluent. No new or worsened opacity is seen. The pleural effusions are unchanged. The mediastinum is  stable. Calcification again noted transverse aorta. Thoracic spondylosis. IMPRESSION: 1. Diffuse interstitial and airspace opacities are slightly less dense today, minimally less confluent. No new or worsening opacity. 2. Stable pleural effusions. 3. Stable cardiomegaly and central vascular prominence. Electronically Signed   By: Telford Nab M.D.   On: 05/24/2022 05:16   DG CHEST PORT 1 VIEW  Result Date: 05/15/2022 CLINICAL DATA:  Intubation and OG tube placement EXAM: PORTABLE CHEST 1 VIEW COMPARISON:  05/18/2022 at 1541 hours FINDINGS: Interval placement of endotracheal tube terminating approximately 5.3 cm above the carina. Enteric tube courses below the diaphragm with distal tip beyond the inferior margin of the film. Stable heart size. Widespread bilateral airspace opacities. Small bilateral pleural effusions. No pneumothorax. IMPRESSION: 1. Interval placement of endotracheal and enteric tubes in satisfactory position. 2. Widespread bilateral airspace opacities, similar to prior. Electronically Signed   By: Davina Poke D.O.   On: 05/27/2022 18:57   DG Abd 1 View  Result Date: 05/12/2022 CLINICAL DATA:  Enteric catheter placement EXAM: ABDOMEN - 1 VIEW COMPARISON:  05/13/2022 FINDINGS: Frontal view of the lower chest and upper abdomen demonstrates enteric catheter passing below diaphragm with tip and side port projecting over the gastric antrum. Endotracheal tube tip overlies tracheal air column, well above carina. Proximal extent of  endoluminal aortic stent graft identified. Widespread bilateral airspace disease unchanged since prior exam. Bowel gas pattern is unremarkable. IMPRESSION: 1. Enteric catheter tip projecting over the gastric antrum. 2. Widespread bilateral airspace disease unchanged. Electronically Signed   By: Randa Ngo M.D.   On: 05/17/2022 18:57   DG Chest Port 1 View  Result Date: 06/04/2022 CLINICAL DATA:  Hypoxia.  Recently treated for pneumonia. EXAM: PORTABLE CHEST 1 VIEW COMPARISON:  Chest radiograph dated 05/20/2022 FINDINGS: Increased bilateral airspace opacities. Normal lung volumes. Trace bilateral pleural effusions. No pneumothorax. Similar cardiomediastinal silhouette. The visualized skeletal structures are unremarkable. IMPRESSION: 1. Increased bilateral airspace opacities, suspicious for multifocal infection. 2. Trace bilateral pleural effusions. Electronically Signed   By: Darrin Nipper M.D.   On: 06/09/2022 15:51     Assessment and Plan:   Acute Respiratory failure/ARDS/flash pulmonary edema/ Possible HCAP- RVP neg, respiratory culture negative, urine strep antigen neg  per respiratory   flash pulmonary edema most likely from acute ischemia/MI with deeper ST inversions inf. Laterally and now troponin >25,000.  Will continue ASA/Plavix and heparin with Cr 3.14  will monitor for bleeding is on IV PPI ACS with hs troponin >24,000  IV heparin, asa per NG and plavix per NG  treat medically for now he has significant PAD and may need CABG though unsure on timing of cath AKI   last admit Cr 1.66 at discharge now 3.14 per CCM may need renal consult to help direct for cath Anemia of critical illness - follow per CCM Hx of atrial fib last admit and on bisoprolol and eliquis  Chronic diastolic HF  would continue lasix 40 mg every 12 hours for now.  PAD s/p LSFA stent 2012, RCEA 2006, endovascular AAA repair 2020, stent to SMA for chronic mesenteric ischemia 2021   he is statin intolerant   Hld 05/17/22 LDL  53 HDL 34 t chol 99 TG 61     Risk Assessment/Risk Scores:     TIMI Risk Score for Unstable Angina or Non-ST Elevation MI:   The patient's TIMI risk score is 4, which indicates a 20% risk of all cause mortality, new or recurrent myocardial infarction or need for urgent revascularization in  the next 14 days.  New York Heart Association (NYHA) Functional Class NYHA Class II  CHA2DS2-VASc Score = 6   This indicates a 9.7% annual risk of stroke. The patient's score is based upon: CHF History: 1 HTN History: 1 Diabetes History: 1 Stroke History: 0 Vascular Disease History: 1 Age Score: 2 Gender Score: 0         For questions or updates, please contact [place] Please consult [email or web address] for contact info under    Signed, Cecilie Kicks, NP  05/27/2022 11:40 AM  Patient seen and examined and agree with Cecilie Kicks, NP as detailed above.  In brief, the patient is a 76 year old male with history of PAD with LSFA stent 2012, RCEA 2006, HTN, HLD, mesenteric ischemia with stent to SMA and endovascular abdominal aneurysm repair in 2020 with recent admission 12/5-12/11 with PNA and new Afib with RVR who returned on 05/12/2022 for worsening hypoxia and concern for ARDS. Was initially intubated but extubated on 12/16. Developed recurrent worsening distress and required re-intubation on 12/17. ECG at that time with worsening STD with TWI in anterolateral leads. Trop >25,000. Cardiology is now consulted for further management.  Overall, suspect patient's acute decompensation was related to ischemic event with flash pulmonary edema. Has known extensive PAD and triple vessel coronary Ca on CT chest and likely has clinically significant CAD. Trop >25,000 with worsening of baseline TWI/STD in the anterolateral leads. Unfortunately, he is not a cath candidate at this time given significant AKI and risk of ARF with contrast administration. Will continue with medical management of NSTEMI. If  renal function improves, can plan for cath at that time.   GEN: Intubated and sedated Neck: ETT in place Cardiac: RRR, no murmurs, rubs, or gallops.  Respiratory: Coarse vent sounds GI: Soft, nontender, non-distended  MS: No edema; No deformity. Warm. Neuro:  Sedated Psych: Unable to assess   Plan: -Suspect patient has clinically significant CAD which led to his worsening respiratory status with suspected flash pulmonary edema overnight -Cannot perform LHC at this time due to risk of ARF with contrast administration given significant AKI on CKD; can consider in future pending renal function -Will continue with medical management with ASA, heparin and plavix -Management of shock/ARDs per PCCM -Likely plan to start HD for volume management -Follow-up TTE  Gwyndolyn Kaufman, MD

## 2022-05-27 NOTE — Progress Notes (Signed)
NAME:  Samuel [name], MRN:  [id number], DOB:  [birth year], LOS: 4 ADMISSION DATE:  05/11/2022, CONSULTATION DATE: 05/23/2022 REFERRING MD: Dr. Cyd Silence, CHIEF COMPLAINT: Respiratory distress  History of Present Illness:  76 yo M hx AAA, PAD, HTN recently admitted to [facility] 12/5-12/11 with multifocal PNA and hypoxia, of unclear etiology but received course of abx (rocephin, azithro), presented to [facility] ED 12/14 with worse SOB. CXR in ed shows significant progression of bilat infiltrates from prior, concerning for ARDS.  He was hypoxic and placed on BiPAP. Initially planned to be admitted to [facility] but quickly declined despite bipap. PCCM was called urgently for respiratory distress    Last admisison developed new Afib RVR, dc with eliquis Also dc with new O2 req RVP, Covid, flu, legionella, strep ag were neg for that admission.     In d/w wife, she notes that pt was handling raw deer meat before prior admission and seems like he has been declining since. Isnt sure if this might be related. She discussed intubation and code status with Dr. Cyd Silence and endorses full code / full scope offered treatment, which she also confirmed with me prior to intubation.    Pertinent  Medical History   Past Medical History:  Diagnosis Date   AAA (abdominal aortic aneurysm) ([place])    Carotid artery occlusion    Carotid artery stenosis 05/14/2022   Jun 05, 2019 Entered By: Ria Clock Comment: Right carotid endarterectomy.   Essential hypertension 10/16/2007   Qualifier: Diagnosis of   By: Danelle Earthly CMA, Darlene       Genital warts    history of   Glucose intolerance 05/14/2022   History of carotid stenosis    s/p right carotid endarterectomy 12/06   Hyperlipidemia    Hypertension    PAD (peripheral artery disease) ([place])    Left leg   Peripheral vascular disease ([place]) 10/16/2007   Centricity Description: INTERMITTENT CLAUDICATION, LEFT LEG  Qualifier: Diagnosis of   By: Danelle Earthly CMA, Darlene       Centricity Description: UNSPECIFIED PERIPHERAL VASCULAR DISEASE  Qualifier: Diagnosis of   By: Bertell Maria Events: Including procedures, antibiotic start and stop dates in addition to other pertinent events   Chest x-ray with extensive bilateral infiltrate Recent CT scan with extensive BL infiltrate, pleural effusions echocardiogram 54/07/7060 with diastolic dysfunction grade 2 ETT 12/14 - 12/16 12/17 reintubation after probable flash pulmonary edema, failed BiPAP  Interim History / Subjective:  Failed BiPAP yesterday evening, required reintubation. This AM, is heavily sedated on 25 Propofol.  Good UOP yesterday, some 1.8L, net +2.2L since admit. Have asked RN to start WUA and SBT if able.   Objective   Blood pressure (!) 131/44, pulse 64, temperature (!) 97.5 F (36.4 C), resp. rate (!) 21, height '5\' 10"'$  (1.778 m), weight 81.1 kg, SpO2 95 %.    Vent Mode: PRVC FiO2 (%):  [40 %-50 %] 50 % Set Rate:  [20 bmp] 20 bmp Vt Set:  [460 mL] 460 mL PEEP:  [8 cmH20] 8 cmH20 Plateau Pressure:  [0 cmH20-19 cmH20] 19 cmH20   Intake/Output Summary (Last 24 hours) at 05/27/2022 0746 Last data filed at 05/27/2022 3762 Gross per 24 hour  Intake 1269.68 ml  Output 1800 ml  Net -530.32 ml    Filed Weights   05/24/2022 1814 05/25/22 0500 05/27/22 0500  Weight: 79.2 kg 82.4 kg 81.1 kg   Examination: General: Adult male, resting in bed, in  NAD. Neuro: Sedated, not responsive. HEENT: [place]/AT. Sclerae anicteric. ETT in place. Cardiovascular: RRR, no M/R/G.  Lungs: Respirations even and unlabored.  CTA bilaterally, No W/R/R. Abdomen: BS x 4, soft, NT/ND.  Musculoskeletal: No gross deformities, no edema.  Skin: Intact, warm, no rashes.  Assessment & Plan:   ARDS Possible HCAP- RVP neg, respiratory culture negative, urine strep antigen neg Previous CT from 2020 suggests mild ILD but improvement in infiltrates makes IPF flare less likely Flash pulmonary edema  12/17, failed BiPAP and required reintubation -Continue vent support, try SBT once sedation is lightened some - Get ABG given resp acidosis overnight - Continue cefepime empiric x 7 ds - Continue Decadron 10 mg IV every 12 empiric - F/u on Legionella for completion - HRCT chest at some point if he is able to maintain on high flow nasal cannula  Acute kidney injury on chronic kidney disease (baseline creatinine 1.3 on 12/5) - Likely contrast-induced + volume overload - Continue lasix, goal neg balance. 40 mg BID for now - Avoid nephrotoxic agents - Trend electrolytes  Anemia of critical illness - Follow  New onset atrial fibrillation developed during last admission 12/5 On anticoagulation , maintaining sinus rhythm this admission - Hold anticoagulation for now due to bloody sputum early in admission  Chronic diastolic heart failure Hypertension - Lasix '40mg'$  q12 for now. - Follow BMP. - Strict I/O's. - Continue hydralazine '50mg'$  TID - Hold bisoprolol and amlodipine   Best Practice (right click and "Reselect all SmartList Selections" daily)   Diet/type: NPO DVT prophylaxis: SCD GI prophylaxis: PPI Lines: N/A Foley:  N/A Code Status:  full code Last date of multidisciplinary goals of care discussion [pending] Dr. Elsworth Soho updated spouse at bedside 12/17   CC time: 35 min.   Montey Hora, [place] [facility] For pager details, please see AMION or use Epic chat  After 1900, please call [place] for cross coverage needs 05/27/2022, 8:01 AM

## 2022-05-27 NOTE — Progress Notes (Signed)
PT Cancellation/Sign off Note  Patient Details Name: NATE COMMON MRN: [id number] DOB: Jan 17, 1946   Cancelled Treatment:    Reason Eval/Treat Not Completed: Medical issues which prohibited therapy. Re-intubated on 12/17. Will sign off at this time. Please reorder [place] once pt is medically ready for PT. Thanks.     [facility]  Office: [phone]

## 2022-05-27 NOTE — Progress Notes (Signed)
[facility] Progress Note Patient Name: Samuel [name] DOB: [birth year] MRN: [id number]   Date of Service  05/27/2022  HPI/Events of Note  ABG on 50%/PRVC 35/TV 460/P 8 = 7.19/43/167/16.8. Patient is already on CRRT and a NaHCO3 IV infusion. Patient going in and out of AFIB with RVR. Last K+ = 5.7 and Mg++ = 3.0. Would control AFIB rate for sustained HR in 130-140 range.   eICU Interventions  Plan: NaHCO3 100 meq IV now. Repeat ABG in AM.      Intervention Category Major Interventions: Acid-Base disturbance - evaluation and management;Respiratory failure - evaluation and management  Lysle Dingwall 05/27/2022, 11:42 PM

## 2022-05-27 NOTE — Progress Notes (Addendum)
PCCM Interval Progress Note  Multiple issues:  ABG: 7.1/57/117. On full vent support with rate 20. - Increase rate to 30, continue sedation as needed for vent synchrony. - Repeat ABG at 1100.  RN notified me of K 6.5, no hemolysis. - 1g Ca Gluconate, 1 amp D50, 10u Insulin, 10g Lokelma. - Repeat BMP 1300.  Hypotension after sedation bolus and receiving '40mg'$  Lasix earlier with SBP in 50's. - Start Norepineprhine infusion. - Hold Hydralazine.   Additional CC time: 20 min.   ADDENDUM 0945:  Called to bedside ~0920 for worsening BP with MAP in 30's. Levophed increased to 20, 138mg Neosynephrine given, 500cc NS ordered. CVL placed R femoral. Attempted R femoral arterial line but difficulty with anatomy and access; therefore, aborted. RT to attempt radial placement.  HR varying from 50s to low 100s. EKG with some inferior ST depressions and lateral TWI. Will check troponin and echo.  Updated pt's wife over the phone. She will be visiting shortly.   Additional CC time: 30 min.    RMontey Hora [facility] For pager details, please see AMION or use Epic chat  After 1900, please call [place] cross coverage needs 05/27/2022, 9:09 AM

## 2022-05-27 NOTE — Procedures (Signed)
Arterial Catheter Insertion Procedure Note  RANNY WIEBELHAUS  [id number]  [birth year]  Date:05/27/22  Time:10:17 AM    Provider Performing: Otelia Sergeant    Procedure: Insertion of Arterial Line [phone]) without US guidance  Indication(s) Blood pressure monitoring and/or need for frequent ABGs  Consent Risks of the procedure as well as the alternatives and risks of each were explained to the patient and/or caregiver.  Consent for the procedure was obtained and is signed in the bedside chart  Anesthesia None   Time Out Verified patient identification, verified procedure, site/side was marked, verified correct patient position, special equipment/implants available, medications/allergies/relevant history reviewed, required imaging and test results available.   Sterile Technique Maximal sterile technique including full sterile barrier drape, hand hygiene, sterile gown, sterile gloves, mask, hair covering, sterile ultrasound probe cover (if used).   Procedure Description Area of catheter insertion was cleaned with chlorhexidine and draped in sterile fashion. Without real-time ultrasound guidance an arterial catheter was placed into the left radial artery.  Appropriate arterial tracings confirmed on monitor.     Complications/Tolerance None; patient tolerated the procedure well.   EBL Minimal   Specimen(s) None

## 2022-05-27 NOTE — Progress Notes (Signed)
OT Cancellation Note  Patient Details Name: Samuel [name] MRN: [id number] DOB: 01-Feb-1946   Cancelled Treatment:    Reason Eval/Treat Not Completed: Medical issues which prohibited therapy Patient intubated in PM hours for respiratory failure. OT to continue to follow and check back for medical appropriateness on 12/19.  Rennie Plowman, [facility] Office# [phone]  05/27/2022, 9:08 AM

## 2022-05-27 NOTE — Progress Notes (Signed)
Pharmacy Antibiotic Note  Samuel [name] is a 76 y.o. male admitted on 06/04/2022 with pneumonia and initially on vancomycin and cefepime per Pharmacy consult.  Vancomycin discontinued on 12/15.  Today, Pharmacy remains consulted for cefepime dosing.  Plan: Continue Cefepime 2 gr IV q24h  Monitor clinical course, renal function, cultures as available   Height: '5\' 10"'$  (177.8 cm) Weight: 81.1 kg (178 lb 12.7 oz) IBW/kg (Calculated) : 73  Temp (24hrs), Avg:97.7 F (36.5 C), Min:97.3 F (36.3 C), Max:97.9 F (36.6 C)  Recent Labs  Lab 06/01/2022 1520 06/09/2022 1713 06/06/2022 1831 05/24/22 0231 05/24/22 1301 05/25/22 0614 05/26/22 0745  WBC 12.3*  --  14.7* 10.8*  --  10.2 8.6  CREATININE 2.45*  --  2.53* 3.33* 3.61* 3.34* 3.14*  LATICACIDVEN 1.4 1.2  --   --   --   --  1.2     Estimated Creatinine Clearance: 20 mL/min (A) (by C-G formula based on SCr of 3.14 mg/dL (H)).    Allergies  Allergen Reactions   Diclofenac Diarrhea   Lipitor [Atorvastatin] Other (See Comments)    Muscle pain   Livalo [Pitavastatin] Other (See Comments)    Muscle aches   Statins     Muscle aches    Antimicrobials this admission: 12/14 vancomycin >> 12/15 12/14 cefepime >>    Microbiology results: 12/14 BCx: NGTD 12/15 resp culture:  no organisms seen 12/14 MRSA PCR: not detected 12/14 resp panel: negative  Thank you for allowing pharmacy to be a part of this patient's care.  Royetta Asal, PharmD, BCPS Clinical Pharmacist [place] Please utilize Amion for appropriate phone number to reach the unit pharmacist ([place]) 05/27/2022 7:40 AM

## 2022-05-27 NOTE — Progress Notes (Signed)
Chaplain engaged in an initial visit with Samuel [name] wife at his bedside.  Chaplain provided listening and presence as she shared about Samuel [name] healthcare journey.  She voiced that Samuel [name] family has a history of circulatory/heart issues and have passed in their 51's or 69's.    They have been married 41 years and met while living in the same neighborhood.  [name] has one daughter.  They are also caregivers of Samuel [name] 78 year old mother-in-law.  Wife voiced that she is not only here with her husband, but that she also is taking care of her mom and working from home.  Chaplain offered support amongst all the transitions happening in their lives.  They are well connected to the spiritual care community, and Father Barnabas Lister has been to see them several times.     05/27/22 1500  Clinical Encounter Type  Visited With Patient and family together  Visit Type Initial;Spiritual support

## 2022-05-27 NOTE — Consult Note (Signed)
Reason for Consult: AKI Referring Physician: Elsworth Soho, MD  Samuel [name] is an 76 y.o. male with a PMH significant for PAD, right CEA, HTN, HLD, mesenteric ischemia, s/p endovascular repair of AAA, atrial fibrillation, chronic diastolic CHF, and recent admission for pneumonia (12/5-12/11/23) who presented to [facility] ED via EMS on 05/20/2022 with worsening SOB.  SpO2 was in the 60's and placed on NRB.  He was admitted and noted to have worsening bilateral infiltrates and ARDS.  He was intubated on admission but extubated on 05/25/22, however developed acute respiratory distress on 05/26/22 with severe hypoxia and was reintubated.  He developed new EKG changes concerning for ischemia.  He also developed flash pulmonary edema.  We were consulted due to the development of AKI and possible CRRT.  The trend in Scr is seen below.  Pt is intubated and sedated, so could not participate in the HPI which was formed with review of the existing medical records.  He has been started on levophed and vasopressin due to profound hypotension this morning.  Trend in Creatinine: Creatinine, Ser  Date/Time Value Ref Range Status  05/27/2022 10:39 AM 5.44 (H) 0.61 - 1.24 mg/dL Final  05/26/2022 07:45 AM 3.14 (H) 0.61 - 1.24 mg/dL Final  05/25/2022 06:14 AM 3.34 (H) 0.61 - 1.24 mg/dL Final  05/24/2022 01:01 PM 3.61 (H) 0.61 - 1.24 mg/dL Final  05/24/2022 02:31 AM 3.33 (H) 0.61 - 1.24 mg/dL Final  05/16/2022 06:31 PM 2.53 (H) 0.61 - 1.24 mg/dL Final  05/22/2022 03:20 PM 2.45 (H) 0.61 - 1.24 mg/dL Final  05/20/2022 03:45 AM 1.66 (H) 0.61 - 1.24 mg/dL Final  05/19/2022 03:49 AM 1.40 (H) 0.61 - 1.24 mg/dL Final  05/18/2022 04:19 AM 1.86 (H) 0.61 - 1.24 mg/dL Final  05/16/2022 03:20 AM 1.62 (H) 0.61 - 1.24 mg/dL Final  05/15/2022 03:19 AM 1.56 (H) 0.61 - 1.24 mg/dL Final  05/14/2022 12:10 PM 1.29 (H) 0.61 - 1.24 mg/dL Final  06/21/2019 10:34 AM 0.60 (L) 0.61 - 1.24 mg/dL Final  11/20/2018 04:14 AM 0.95 0.61 - 1.24 mg/dL Final   11/19/2018 07:35 PM 0.84 0.61 - 1.24 mg/dL Final  11/18/2018 03:12 PM 0.97 0.61 - 1.24 mg/dL Final  03/04/2014 09:34 AM 1.0 0.4 - 1.5 mg/dL Final  02/18/2013 04:44 PM 0.9 0.4 - 1.5 mg/dL Final  03/26/2012 08:14 AM 0.9 0.4 - 1.5 mg/dL Final  08/19/2011 02:44 PM 1.4 0.4 - 1.5 mg/dL Final  03/08/2011 09:00 AM 1.0 0.4 - 1.5 mg/dL Final  10/02/2010 08:29 AM 1.0 0.4 - 1.5 mg/dL Final  09/26/2010 05:15 AM 0.97 0.4 - 1.5 mg/dL Final  09/25/2010 06:53 AM 1.2 0.4 - 1.5 mg/dL Final  08/16/2010 07:56 AM 1.0 0.4 - 1.5 mg/dL Final  02/14/2010 08:20 AM 0.9 0.4 - 1.5 mg/dL Final  10/09/2009 08:18 AM 1.0 0.4 - 1.5 mg/dL Final  10/19/2008 08:22 AM 1.0 0.4 - 1.5 mg/dL Final  12/23/2007 07:59 AM 1.0 0.4 - 1.5 mg/dL Final  08/07/2006 08:08 AM 1.0 0.4 - 1.5 mg/dL Final    PMH:   Past Medical History:  Diagnosis Date   AAA (abdominal aortic aneurysm) ([place])    Carotid artery occlusion    Carotid artery stenosis 05/14/2022   Jun 05, 2019 Entered By: Ria Clock Comment: Right carotid endarterectomy.   Essential hypertension 10/16/2007   Qualifier: Diagnosis of   By: Danelle Earthly CMA, Darlene       Genital warts    history of   Glucose intolerance 05/14/2022   History of carotid stenosis  s/p right carotid endarterectomy 12/06   Hyperlipidemia    Hypertension    PAD (peripheral artery disease) ([facility])    Left leg   Peripheral vascular disease ([place]) 10/16/2007   Centricity Description: INTERMITTENT CLAUDICATION, LEFT LEG  Qualifier: Diagnosis of   By: Danelle Earthly CMA, Darlene      Centricity Description: UNSPECIFIED PERIPHERAL VASCULAR DISEASE  Qualifier: Diagnosis of   By: Wynona Luna       PSH:   Past Surgical History:  Procedure Laterality Date   ABDOMINAL AORTIC ENDOVASCULAR STENT GRAFT N/A 11/19/2018   Procedure: ABDOMINAL AORTIC ENDOVASCULAR STENT GRAFT;  Surgeon: Waynetta Sandy, MD;  Location: [place];  Service: Vascular;  Laterality: N/A;   [place]  05/2005   right   COLONOSCOPY     KNEE SURGERY  1962   Left Leg stents  2012   VISCERAL ANGIOGRAPHY N/A 06/21/2019   Procedure: VISCERAL ANGIOGRAPHY;  Surgeon: Waynetta Sandy, MD;  Location: [place] [facility];  Service: Cardiovascular;  Laterality: N/A;   VISCERAL ARTERY INTERVENTION N/A 06/21/2019   Procedure: VISCERAL ARTERY INTERVENTION;  Surgeon: Waynetta Sandy, MD;  Location: [place] [facility];  Service: Cardiovascular;  Laterality: N/A;  Stent to SMA    Allergies:  Allergies  Allergen Reactions   Diclofenac Diarrhea   Lipitor [Atorvastatin] Other (See Comments)    Muscle pain   Livalo [Pitavastatin] Other (See Comments)    Muscle aches   Statins     Muscle aches    Medications:   Prior to Admission medications   Medication Sig Start Date End Date Taking? Authorizing Provider  amLODipine (NORVASC) 10 MG tablet Take 1 tablet (10 mg total) by mouth daily. 05/21/22  Yes Kathie Dike, MD  apixaban (ELIQUIS) 5 MG TABS tablet Take 1 tablet (5 mg total) by mouth 2 (two) times daily. 05/18/22 08/16/22 Yes Ghimire, Dante Gang, MD  bisoprolol (ZEBETA) 10 MG tablet Take 1 tablet (10 mg total) by mouth daily. 05/18/22 06/17/22 Yes Ghimire, Dante Gang, MD  Cholecalciferol (VITAMIN D3) 125 MCG (5000 UT) CHEW Chew 5,000 Units by mouth daily after breakfast.   Yes [provider]  clopidogrel (PLAVIX) 75 MG tablet TAKE 1 TABLET BY MOUTH EVERY DAY Patient taking differently: Take 75 mg by mouth in the morning. 08/16/20  Yes Waynetta Sandy, MD  hydrALAZINE (APRESOLINE) 100 MG tablet Take 1 tablet (100 mg total) by mouth 3 (three) times daily. 05/18/22 06/17/22 Yes Ghimire, Dante Gang, MD  ipratropium-albuterol (DUONEB) 0.5-2.5 (3) MG/3ML SOLN Take 3 mLs by nebulization every 6 (six) hours as needed. Patient taking differently: Take 3 mLs by nebulization in the morning, at noon, and at bedtime. 05/20/22  Yes Kathie Dike, MD  NON FORMULARY Take 1 capsule by  mouth See admin instructions. Beet plus capsules- Take 1 capsule by mouth once a day   Yes [provider]  NON FORMULARY Take 1 capsule by mouth See admin instructions. Juice Plus capsules- Take 1 capsule by mouth once a day   Yes [provider]  sodium chloride (MURO 128) 5 % ophthalmic solution Place 1 drop into both eyes See admin instructions. Instill 1 drop into both eyes in the morning, afternoon, and evening   Yes [provider]  tamsulosin (FLOMAX) 0.4 MG CAPS capsule Take 0.4 mg by mouth in the morning.   Yes [provider]  fluocinonide cream (LIDEX) 1.85 % Apply 1 Application topically 2 (two) times daily as needed (  to affected areas for itching/swelling). Patient not taking: Reported on 06/09/2022    [provider]  sildenafil (VIAGRA) 100 MG tablet Take 100 mg by mouth daily as needed for erectile dysfunction (one hour prior to activity- max of 1 tablet/day). Patient not taking: Reported on 05/13/2022    [provider]    Inpatient medications:  amLODipine  10 mg Per Tube Daily   aspirin  325 mg Per Tube Daily   Chlorhexidine Gluconate Cloth  6 each Topical Daily   clopidogrel  75 mg Per NG tube Daily   dexamethasone (DECADRON) injection  10 mg Intravenous Q12H   famotidine  20 mg Per Tube QHS   hydrALAZINE  50 mg Per Tube TID   pantoprazole (PROTONIX) IV  40 mg Intravenous Daily   polyethylene glycol  17 g Per Tube Daily    Discontinued Meds:   Medications Discontinued During This Encounter  Medication Reason   famotidine (PEPCID) tablet 20 mg    heparin injection 5,000 Units    Oral care mouth rinse    vancomycin (VANCOREADY) IVPB 750 mg/150 mL    polyethylene glycol (MIRALAX / GLYCOLAX) packet 17 g    feeding supplement (VITAL HIGH PROTEIN) liquid 1,000 mL    feeding supplement (PROSource TF20) liquid 60 mL    dexamethasone (DECADRON) injection 20 mg    fentaNYL (SUBLIMAZE) injection 25 mcg    fentaNYL  (SUBLIMAZE) injection 25-100 mcg    propofol (DIPRIVAN) 1000 MG/100ML infusion    feeding supplement (PIVOT 1.5 CAL) liquid 1,000 mL    hydrALAZINE (APRESOLINE) tablet 50 mg    lactated ringers infusion    docusate (COLACE) 50 MG/5ML liquid 100 mg    polyethylene glycol (MIRALAX / GLYCOLAX) packet 17 g    heparin ADULT infusion 100 units/mL (25000 units/223m)    dexmedetomidine (PRECEDEX) 200 MCG/50ML (4 mcg/mL) infusion    Oral care mouth rinse    Oral care mouth rinse Duplicate   acetaminophen (TYLENOL) tablet 650 mg    amLODipine (NORVASC) tablet 10 mg    hydrALAZINE (APRESOLINE) tablet 50 mg    propofol (DIPRIVAN) 1000 MG/100ML infusion    dexmedetomidine (PRECEDEX) 200 MCG/50ML (4 mcg/mL) infusion    norepinephrine (LEVOPHED) '4mg'$  in 2525m(0.016 mg/mL) premix infusion    norepinephrine (LEVOPHED) '4mg'$  in 250105m0.016 mg/mL) premix infusion    furosemide (LASIX) injection 40 mg     Social History:  reports that he has quit smoking. He has never used smokeless tobacco. He reports current alcohol use of about 24.0 standard drinks of alcohol per week. He reports that he does not use drugs.  Family History:   Family History  Problem Relation Age of Onset   Stroke Mother        deceased age 6 46d her firsrt CVA age  46 80Hyperlipidemia Mother    Peripheral vascular disease Father        status post leg amputation  secondary to PVD   Hyperlipidemia Father    Peripheral vascular disease Brother    Colon cancer Neg Hx    Esophageal cancer Neg Hx    Prostate cancer Neg Hx    Rectal cancer Neg Hx    Stomach cancer Neg Hx     Review of systems not obtained due to patient factors. Weight change:   Intake/Output Summary (Last 24 hours) at 05/27/2022 1334 Last data filed at 05/27/2022 1208 Gross per 24 hour  Intake 442.35 ml  Output 1820 ml  Net -1377.65 ml   BP (!) 146/45   Pulse 76   Temp (!) 95.2 F (35.1 C)   Resp (!) 25   Ht '5\' 10"'$  (1.778 m)   Wt 81.1 kg   SpO2  100%   BMI 25.65 kg/m  Vitals:   05/27/22 1145 05/27/22 1200 05/27/22 1215 05/27/22 1230  BP:  (!) 146/45    Pulse: 82 73 76 76  Resp: (!) 27 (!) 22 (!) 25 (!) 25  Temp: (!) 95.5 F (35.3 C) (!) 95.4 F (35.2 C) (!) 95.2 F (35.1 C) (!) 95.2 F (35.1 C)  TempSrc:      SpO2: 99% 99% 100% 100%  Weight:      Height:         General appearance: intubated and sedated Head: Normocephalic, without obvious abnormality, atraumatic Neck: no adenopathy, no JVD, supple, symmetrical, trachea midline, thyroid not enlarged, symmetric, no tenderness/mass/nodules, and carotid bruit on left side Resp: ventilated BS bilaterally Cardio: regular rate and rhythm, S1, S2 normal, no murmur, click, rub or gallop GI: soft, non-tender; bowel sounds normal; no masses,  no organomegaly Extremities: extremities normal, atraumatic, no cyanosis or edema  Labs: Basic Metabolic Panel: Recent Labs  Lab 05/16/2022 1520 06/03/2022 1831 05/10/2022 2032 05/24/22 0231 05/24/22 1301 05/24/22 1938 05/25/22 0614 05/26/22 0745 05/27/22 1039  NA 137  --   --  141 139  --  138 144 140  K 4.3  --   --  5.3* 5.1  --  4.3 4.5 6.3*  CL 106  --   --  108 109  --  107 113* 108  CO2 20*  --   --  19* 17*  --  19* 19* 11*  GLUCOSE 124*  --   --  151* 139*  --  181* 160* 250*  BUN 76*  --   --  93* 108*  --  115* 123* 137*  CREATININE 2.45* 2.53*  --  3.33* 3.61*  --  3.34* 3.14* 5.44*  ALBUMIN 3.3*  --   --   --   --   --   --   --   --   CALCIUM 9.2  --   --  9.0 8.3*  --  8.7* 8.6* 8.2*  PHOS  --   --  6.5* 7.1*  --  8.1* 6.7*  --   --    Liver Function Tests: Recent Labs  Lab 06/04/2022 1520  AST 36  ALT 40  ALKPHOS 59  BILITOT 0.8  PROT 7.2  ALBUMIN 3.3*   No results for input(s): "LIPASE", "AMYLASE" in the last 168 hours. No results for input(s): "AMMONIA" in the last 168 hours. CBC: Recent Labs  Lab 06/04/2022 1520 05/30/2022 1831 05/24/22 0231 05/24/22 1938 05/25/22 0614 05/26/22 0745 05/27/22 0754   WBC 12.3*   < > 10.8*  --  10.2 8.6 19.8*  NEUTROABS 9.8*  --   --   --   --   --   --   HGB 8.5*   < > 7.1* 8.5* 8.8* 8.1* 9.1*  HCT 25.8*   < > 22.5* 26.6* 27.2* 24.5* 29.5*  MCV 97.7   < > 102.3*  --  97.5 95.3 101.0*  PLT 316   < > 246  --  262 241 256   < > = values in this interval not displayed.   PT/INR: '@LABRCNTIP'$ (inr:5) Cardiac Enzymes: )No results for input(s): "CKTOTAL", "CKMB", "CKMBINDEX", "TROPONINI" in the last 168 hours. CBG:  Recent Labs  Lab 05/26/22 2036 05/26/22 2353 05/27/22 0640 05/27/22 0803 05/27/22 0929  GLUCAP 176* 162* 125* 134* 261*    Iron Studies: No results for input(s): "IRON", "TIBC", "TRANSFERRIN", "FERRITIN" in the last 168 hours.  Xrays/Other Studies: DG Chest Port 1 View  Result Date: 05/27/2022 CLINICAL DATA:  Acute respiratory failure.  Respiratory distress. EXAM: PORTABLE CHEST 1 VIEW COMPARISON:  Chest x-rays dated 05/26/2022 and 05/25/2022. Chest CT dated 05/14/2022. FINDINGS: Endotracheal tube is stable in position with tip approximately 6 cm above the level of the carina. Enteric tube passes into the stomach. Diffuse bilateral airspace opacities, predominantly interstitial, are not significantly changed compared to yesterday's most recent chest x-ray, questionably decreased compared to yesterday's morning chest x-ray. No new lung findings. No pneumothorax is seen. Heart size and mediastinal contours appear stable. IMPRESSION: 1. Diffuse bilateral airspace opacities, predominantly interstitial, compatible with bilateral multifocal pneumonia, not significantly changed. 2. No new lung findings. 3. Support apparatus appears stable in position. Electronically Signed   By: Franki Cabot M.D.   On: 05/27/2022 08:13   Portable Chest x-ray  Result Date: 05/26/2022 CLINICAL DATA:  Endotracheal tube EXAM: PORTABLE CHEST 1 VIEW COMPARISON:  Chest x-ray 05/26/2022 FINDINGS: Endotracheal tube tip is proximally 6 cm above the carina. Enteric tube  extends below the diaphragm with distal tip in the mid stomach. Cardiomediastinal silhouette is within normal limits. Diffuse bilateral airspace opacities in the mid and lower lungs have mildly decreased. No pleural effusion or pneumothorax. No acute fractures are seen. IMPRESSION: 1. Endotracheal tube tip is 6 cm above the carina. 2. Enteric tube tip in the mid stomach. 3. Diffuse bilateral airspace opacities in the mid and lower lungs have mildly decreased. Electronically Signed   By: Ronney Asters M.D.   On: 05/26/2022 19:20   DG CHEST PORT 1 VIEW  Result Date: 05/26/2022 CLINICAL DATA:  Hypoxia EXAM: PORTABLE CHEST 1 VIEW COMPARISON:  05/25/2022, chest CT 05/14/2022, radiograph 05/24/2022, 06/09/2022 FINDINGS: Cardiomegaly. Widespread heterogeneous bilateral airspace disease appears slightly worse compared to yesterday's radiograph. Bilateral effusions. No pneumothorax. IMPRESSION: Widespread heterogeneous bilateral airspace disease, slightly worse compared to yesterday's radiograph. Bilateral effusions and cardiomegaly otherwise unchanged. Electronically Signed   By: Donavan Foil M.D.   On: 05/26/2022 18:31     Assessment/Plan:  AKI - in setting of shock (septic or cardiogenic), non-oliguric with new onset metabolic acidosis and hyperkalemia.  Discussed case with Dr. Elsworth Soho and Mrs. Wagler, both agree with proceeding with CRRT to help manage his electrolyte and volume issues.  UA unremarkable on 05/24/22, no blood or protein.  Doubt this is due to acute GN.   Avoid nephrotoxic medications including NSAIDs and iodinated intravenous contrast exposure unless the latter is absolutely indicated.   Preferred narcotic agents for pain control are hydromorphone, fentanyl, and methadone. Morphine should not be used.  Avoid Baclofen and avoid oral sodium phosphate and magnesium citrate based laxatives / bowel preps.  Continue strict Input and Output monitoring. Will monitor the patient closely with you and  intervene or adjust therapy as indicated by changes in clinical status/labs  Hyperkalemia - due to #1, plan to initiate CRRT Metabolic acidosis - will use isotonic bicarb with replacement fluid once CRRT started Vent dependent respiratory failure - combination of ARDS with flash pulmonary edema.  Plan per PCCM. Shock - likely due to sepsis vs cardiogenic vs sedation.  Currently on pressors and Cardiology following. Anemia of critical illness - transfuse for Hgb <7 Abnormal ECG - Cardiology following.  Governor Rooks Niah Heinle 05/27/2022, 1:34 PM

## 2022-05-27 NOTE — Procedures (Signed)
Central Venous Catheter Insertion Procedure Note  ARLO BUTT  [id number]  23-Aug-1945  Date:05/27/22  Time:2:42 PM   Provider Performing:Mirha Brucato Shearon Stalls   Procedure: Insertion of Non-tunneled Central Venous Catheter(36556)with US guidance (93267)    Indication(s) Hemodialysis  Consent Risks of the procedure as well as the alternatives and risks of each were explained to the patient and/or caregiver.  Consent for the procedure was obtained and is signed in the bedside chart  Anesthesia Topical only with 1% lidocaine   Timeout Verified patient identification, verified procedure, site/side was marked, verified correct patient position, special equipment/implants available, medications/allergies/relevant history reviewed, required imaging and test results available.  Sterile Technique Maximal sterile technique including full sterile barrier drape, hand hygiene, sterile gown, sterile gloves, mask, hair covering, sterile ultrasound probe cover (if used).  Procedure Description Area of catheter insertion was cleaned with chlorhexidine and draped in sterile fashion.   With real-time ultrasound guidance a HD catheter was placed into the left internal jugular vein.  Nonpulsatile blood flow and easy flushing noted in all ports.  The catheter was sutured in place and sterile dressing applied.  Complications/Tolerance None; patient tolerated the procedure well. Chest X-ray is ordered to verify placement for internal jugular or subclavian cannulation.  Chest x-ray is not ordered for femoral cannulation.  EBL Minimal  Specimen(s) None   Montey Hora, PA - [facility] For pager details, please see AMION or use Epic chat  After 1900, please call [place] for cross coverage needs 05/27/2022, 2:42 PM

## 2022-05-27 NOTE — Progress Notes (Incomplete)
{  Select Note:[id number]} 

## 2022-05-27 NOTE — Progress Notes (Signed)
  Echocardiogram 2D Echocardiogram has been performed.  Samuel [name] 05/27/2022, 6:12 PM

## 2022-05-27 NOTE — Progress Notes (Signed)
Nutrition Follow-up  DOCUMENTATION CODES:   Not applicable  INTERVENTION:  - Once medically appropriate, would recommend restarting tube feeds if patient to remain intubated.   Tube feeding via OG (tip needs xray verification of placement prior to starting tube feeds): Pivot 1.5 at 50 ml/h (1285m per day) *Start at 251mhr and advance as tolerated by 1046m8H go goal  Provides 1800 kcal, 113 gm protein, 900 ml free water daily   - Monitor electrolytes. - Monitor weight trends daily.  NUTRITION DIAGNOSIS:   Inadequate oral intake related to inability to eat as evidenced by NPO status (on vent). *ongoing  GOAL:   Patient will meet greater than or equal to 90% of their needs *not being met  MONITOR:   Vent status, Labs, TF tolerance, Weight trends  REASON FOR ASSESSMENT:   Consult Enteral/tube feeding initiation and management  ASSESSMENT:   [age] M hx AAA, PAD, HTN [facility] with multifocal PNA and hypoxia, of unclear etiology but received course of abx (rocephin, azithro), [facility] with worse SOB. CXR in ed shows significant progression of bilat infiltrates from prior, concerning for ARDS.  12/15 admit, intubated, TF of Vital HP started at 21m25m, changed to Pivot 1.5 12/16 Pivot running at 21mL45min AM; Patient extubated, TF discontinued 12/17 re-intubated in PM   Patient documented to have had 50-75% of meals yesterday prior to re-intubation. RN confirms patient has OG. It has not yet been xray verified for placement. Potassium elevated this AM, lokelma ordered.   Medications reviewed and include: Lasix, Miralax Levophed Vasopressin Propofol Fentanyl  Labs reviewed:  K+ 6.3 Creatinine 5.44   Diet Order:   Diet Order             Diet NPO time specified  Diet effective now                   EDUCATION NEEDS:  Not appropriate for education at this time  Skin:  Skin Assessment: Reviewed RN  Assessment  Last BM:  12/17  Height:  Ht Readings from Last 1 Encounters:  06/08/2022 _0  (1.778 m)   Weight:  Wt Readings from Last 1 Encounters:  05/27/22 81.1 kg   Ideal Body Weight:  75.45 kg  BMI:  Body mass index is 25.65 kg/m.  Estimated Nutritional Needs:  Kcal:  2000-2200 kcals ([employer]: 1600 kcals while intubated) Protein:  110-125 g Fluid:  >/= 2L    AspenSamson FredericLDN For contact information, refer to [facility]

## 2022-05-27 NOTE — Procedures (Signed)
Central Venous Catheter Insertion Procedure Note  STEPHAN NELIS  [id number]  December 12, 1945  Date:05/27/22  Time:2:41 PM   Provider Performing:Telissa Palmisano Shearon Stalls   Procedure: Insertion of Non-tunneled Central Venous Catheter(36556) with US guidance (27517)   Indication(s) Medication administration and Difficult access  Consent Unable to obtain consent due to emergent nature of procedure.  Anesthesia Topical only with 1% lidocaine   Timeout Verified patient identification, verified procedure, site/side was marked, verified correct patient position, special equipment/implants available, medications/allergies/relevant history reviewed, required imaging and test results available.  Sterile Technique Maximal sterile technique including full sterile barrier drape, hand hygiene, sterile gown, sterile gloves, mask, hair covering, sterile ultrasound probe cover (if used).  Procedure Description Area of catheter insertion was cleaned with chlorhexidine and draped in sterile fashion.  With real-time ultrasound guidance a central venous catheter was placed into the right femoral vein. Nonpulsatile blood flow and easy flushing noted in all ports.  The catheter was sutured in place and sterile dressing applied.  Complications/Tolerance None; patient tolerated the procedure well. Chest X-ray is ordered to verify placement for internal jugular or subclavian cannulation.   Chest x-ray is not ordered for femoral cannulation.  EBL Minimal  Specimen(s) None   Montey Hora, PA - [facility] [place] [facility] For pager details, please see AMION or use Epic chat  After 1900, please call [facility] for cross coverage needs

## 2022-05-27 NOTE — Progress Notes (Signed)
PHARMACY NOTE:  ANTIMICROBIAL RENAL DOSAGE ADJUSTMENT  Current antimicrobial regimen includes a mismatch between antimicrobial dosage and estimated renal function.  As per policy approved by the Pharmacy & Therapeutics and Medical Executive Committees, the antimicrobial dosage will be adjusted accordingly.  Current antimicrobial dosage:   Cefepime 2 g IV every 24 hours  Indication: possible HCAP  Renal Function:  Estimated Creatinine Clearance: 11.6 mL/min (A) (by C-G formula based on SCr of 5.44 mg/dL (H)). '[]'$      On intermittent HD, scheduled: '[x]'$      On CRRT 28m/kg/hr     Antimicrobial dosage has been changed to:   Cefepime 2 g IV every 12 hours  Additional comments:   Thank you for allowing pharmacy to be a part of this patient's care.  MSuzzanne Cloud [facility]12/18/2023 2:43 PM

## 2022-05-27 NOTE — Progress Notes (Addendum)
ANTICOAGULATION CONSULT NOTE  Pharmacy Consult for Heparin Indication: atrial fibrillation, NSTEMI  Allergies  Allergen Reactions   Diclofenac Diarrhea   Lipitor [Atorvastatin] Other (See Comments)    Muscle pain   Livalo [Pitavastatin] Other (See Comments)    Muscle aches   Statins     Muscle aches    Patient Measurements: Height: '5\' 10"'$  (177.8 cm) Weight: 81.1 kg (178 lb 12.7 oz) IBW/kg (Calculated) : 73 Heparin Dosing Weight: 81.1 kg  Vital Signs: Temp: 95.7 F (35.4 C) (12/18 1100) Temp Source: Bladder (12/18 0800) BP: 150/47 (12/18 1100) Pulse Rate: 77 (12/18 1100)  Labs: Recent Labs    05/24/22 1301 05/24/22 1938 05/24/22 1938 05/25/22 0614 05/26/22 0745 05/27/22 0754 05/27/22 1039  HGB  --  8.5*   < > 8.8* 8.1* 9.1*  --   HCT  --  26.6*   < > 27.2* 24.5* 29.5*  --   PLT  --   --   --  262 241 256  --   APTT  --  72*  --  82* 76*  --   --   HEPARINUNFRC  --   --   --  >1.10* >1.10*  --   --   CREATININE 3.61*  --   --  3.34* 3.14*  --   --   TROPONINIHS  --   --   --   --   --   --  >24,000*   < > = values in this interval not displayed.     Estimated Creatinine Clearance: 20 mL/min (A) (by C-G formula based on SCr of 3.14 mg/dL (H)).  Assessment: AC/Heme: Eliquis PTA (LD 12/14 mid-day) for h/o afib>> 12/14 to IV heparin which was stopped 12/17 due to bloody sputum 12/14-12/16 Hgb 8.2 >7.1 transfuse 12/15>>Hgb 8.8 12/18 ACS with elevated troponins; Pharmacy re-consulted to dose IV heparin for NSTEMI    - Heparin level 0.37 - still elevated from Eliquis    - aPTT 37 - appropriately sub-therapeutic prior to restarting heparin  Goal of Therapy:  aPTT 66-102 seconds Monitor platelets by anticoagulation protocol: Yes Heparin level  0.3-0.7   Plan:  PTA Eliquis for afib on hold Heparin 4000 units IV bolus per infusion x 1 then heparin IV continuous infusion at 1000 units/hr 8 hr aPTT level Monitor daily aPTT & heparin level until correlating then  only use heparin levels, CBC, signs/symptoms of bleeding     Thank you for allowing pharmacy to be a part of this patient's care.  Royetta Asal, PharmD, BCPS Clinical Pharmacist [place] Please utilize Amion for appropriate phone number to reach the unit pharmacist ([place]) 05/27/2022 11:36 AM

## 2022-05-27 NOTE — Progress Notes (Signed)
  Echocardiogram 2D Echocardiogram has been performed.  Samuel [name] 05/27/2022, 6:20 PM

## 2022-05-28 ENCOUNTER — Inpatient Hospital Stay (HOSPITAL_COMMUNITY): Payer: Medicare Other

## 2022-05-28 DIAGNOSIS — I214 Non-ST elevation (NSTEMI) myocardial infarction: Secondary | ICD-10-CM | POA: Diagnosis not present

## 2022-05-28 DIAGNOSIS — Z515 Encounter for palliative care: Secondary | ICD-10-CM

## 2022-05-28 DIAGNOSIS — K7200 Acute and subacute hepatic failure without coma: Secondary | ICD-10-CM | POA: Diagnosis not present

## 2022-05-28 DIAGNOSIS — Z66 Do not resuscitate: Secondary | ICD-10-CM | POA: Diagnosis not present

## 2022-05-28 DIAGNOSIS — J9601 Acute respiratory failure with hypoxia: Secondary | ICD-10-CM | POA: Diagnosis not present

## 2022-05-28 DIAGNOSIS — J80 Acute respiratory distress syndrome: Secondary | ICD-10-CM | POA: Diagnosis not present

## 2022-05-28 DIAGNOSIS — R579 Shock, unspecified: Secondary | ICD-10-CM | POA: Diagnosis present

## 2022-05-28 LAB — GLUCOSE, CAPILLARY
Glucose-Capillary: 192 mg/dL — ABNORMAL HIGH (ref 70–99)
Glucose-Capillary: 150 mg/dL — ABNORMAL HIGH (ref 70–99)
Glucose-Capillary: 103 mg/dL — ABNORMAL HIGH (ref 70–99)
Glucose-Capillary: 115 mg/dL — ABNORMAL HIGH (ref 70–99)
Glucose-Capillary: 65 mg/dL — ABNORMAL LOW (ref 70–99)
Glucose-Capillary: 60 mg/dL — ABNORMAL LOW (ref 70–99)
Glucose-Capillary: 110 mg/dL — ABNORMAL HIGH (ref 70–99)
Glucose-Capillary: 93 mg/dL (ref 70–99)

## 2022-05-28 LAB — COMPREHENSIVE METABOLIC PANEL
ALT: 7798 U/L — ABNORMAL HIGH (ref 0–44)
AST: >10000 U/L — ABNORMAL HIGH (ref 15–41)
Albumin: 2.2 g/dL — ABNORMAL LOW (ref 3.5–5.0)
Alkaline Phosphatase: 105 U/L (ref 38–126)
Anion gap: 25 — ABNORMAL HIGH (ref 5–15)
BUN: 77 mg/dL — ABNORMAL HIGH (ref 8–23)
CO2: 21 mmol/L — ABNORMAL LOW (ref 22–32)
Calcium: 6.4 mg/dL — CL (ref 8.9–10.3)
Chloride: 99 mmol/L (ref 98–111)
Creatinine, Ser: 3.62 mg/dL — ABNORMAL HIGH (ref 0.61–1.24)
GFR, Estimated: 16 mL/min — ABNORMAL LOW (ref >60)
Glucose, Bld: 100 mg/dL — ABNORMAL HIGH (ref 70–99)
Potassium: 5.3 mmol/L — ABNORMAL HIGH (ref 3.5–5.1)
Sodium: 145 mmol/L (ref 135–145)
Total Bilirubin: 3.7 mg/dL — ABNORMAL HIGH (ref 0.3–1.2)
Total Protein: 5.3 g/dL — ABNORMAL LOW (ref 6.5–8.1)

## 2022-05-28 LAB — BLOOD GAS, ARTERIAL
Acid-base deficit: 10.7 mmol/L — ABNORMAL HIGH (ref 0.0–2.0)
Acid-base deficit: 12.2 mmol/L — ABNORMAL HIGH (ref 0.0–2.0)
Bicarbonate: 16.8 mmol/L — ABNORMAL LOW (ref 20.0–28.0)
Bicarbonate: 15.2 mmol/L — ABNORMAL LOW (ref 20.0–28.0)
Drawn by: 25552
FIO2: 50 %
MECHVT: 460 mL
O2 Saturation: 99.2 %
O2 Saturation: 98.3 %
PEEP: 8 cmH2O
Patient temperature: 36.3
Patient temperature: 36.1
RATE: 35 resp/min
pCO2 arterial: 41 mmHg (ref 32–48)
pCO2 arterial: 37 mmHg (ref 32–48)
pH, Arterial: 7.22 — ABNORMAL LOW (ref 7.35–7.45)
pH, Arterial: 7.21 — ABNORMAL LOW (ref 7.35–7.45)
pO2, Arterial: 175 mmHg — ABNORMAL HIGH (ref 83–108)
pO2, Arterial: 145 mmHg — ABNORMAL HIGH (ref 83–108)

## 2022-05-28 LAB — RENAL FUNCTION PANEL
Albumin: 2.5 g/dL — ABNORMAL LOW (ref 3.5–5.0)
Anion gap: 22 — ABNORMAL HIGH (ref 5–15)
BUN: 90 mg/dL — ABNORMAL HIGH (ref 8–23)
CO2: 16 mmol/L — ABNORMAL LOW (ref 22–32)
Calcium: 6.3 mg/dL — CL (ref 8.9–10.3)
Chloride: 106 mmol/L (ref 98–111)
Creatinine, Ser: 3.52 mg/dL — ABNORMAL HIGH (ref 0.61–1.24)
GFR, Estimated: 17 mL/min — ABNORMAL LOW (ref >60)
Glucose, Bld: 107 mg/dL — ABNORMAL HIGH (ref 70–99)
Phosphorus: 10.4 mg/dL — ABNORMAL HIGH (ref 2.5–4.6)
Potassium: 4.8 mmol/L (ref 3.5–5.1)
Sodium: 144 mmol/L (ref 135–145)

## 2022-05-28 LAB — BASIC METABOLIC PANEL
Anion gap: 20 — ABNORMAL HIGH (ref 5–15)
BUN: 77 mg/dL — ABNORMAL HIGH (ref 8–23)
CO2: 16 mmol/L — ABNORMAL LOW (ref 22–32)
Calcium: 6.1 mg/dL — CL (ref 8.9–10.3)
Chloride: 108 mmol/L (ref 98–111)
Creatinine, Ser: 3.53 mg/dL — ABNORMAL HIGH (ref 0.61–1.24)
GFR, Estimated: 17 mL/min — ABNORMAL LOW (ref >60)
Glucose, Bld: 103 mg/dL — ABNORMAL HIGH (ref 70–99)
Potassium: 4.5 mmol/L (ref 3.5–5.1)
Sodium: 144 mmol/L (ref 135–145)

## 2022-05-28 LAB — PHOSPHORUS: Phosphorus: 9.6 mg/dL — ABNORMAL HIGH (ref 2.5–4.6)

## 2022-05-28 LAB — CBC
HCT: 28.6 % — ABNORMAL LOW (ref 39.0–52.0)
HCT: 27.1 % — ABNORMAL LOW (ref 39.0–52.0)
Hemoglobin: 8.8 g/dL — ABNORMAL LOW (ref 13.0–17.0)
Hemoglobin: 8.5 g/dL — ABNORMAL LOW (ref 13.0–17.0)
MCH: 32.1 pg (ref 26.0–34.0)
MCH: 32.2 pg (ref 26.0–34.0)
MCHC: 30.8 g/dL (ref 30.0–36.0)
MCHC: 31.4 g/dL (ref 30.0–36.0)
MCV: 104.4 fL — ABNORMAL HIGH (ref 80.0–100.0)
MCV: 102.7 fL — ABNORMAL HIGH (ref 80.0–100.0)
Platelets: 131 10*3/uL — ABNORMAL LOW (ref 150–400)
Platelets: 119 10*3/uL — ABNORMAL LOW (ref 150–400)
RBC: 2.74 MIL/uL — ABNORMAL LOW (ref 4.22–5.81)
RBC: 2.64 MIL/uL — ABNORMAL LOW (ref 4.22–5.81)
RDW: 15.5 % (ref 11.5–15.5)
RDW: 15.6 % — ABNORMAL HIGH (ref 11.5–15.5)
WBC: 21.6 10*3/uL — ABNORMAL HIGH (ref 4.0–10.5)
WBC: 19.6 10*3/uL — ABNORMAL HIGH (ref 4.0–10.5)
nRBC: 1.1 % — ABNORMAL HIGH (ref 0.0–0.2)
nRBC: 1.9 % — ABNORMAL HIGH (ref 0.0–0.2)

## 2022-05-28 LAB — CULTURE, BLOOD (ROUTINE X 2)
Culture: NO GROWTH
Culture: NO GROWTH
Special Requests: ADEQUATE

## 2022-05-28 LAB — APTT: aPTT: 65 seconds — ABNORMAL HIGH (ref 24–36)

## 2022-05-28 LAB — LACTIC ACID, PLASMA: Lactic Acid, Venous: >9 mmol/L (ref 0.5–1.9)

## 2022-05-28 LAB — BRAIN NATRIURETIC PEPTIDE: B Natriuretic Peptide: 1457.4 pg/mL — ABNORMAL HIGH (ref 0.0–100.0)

## 2022-05-28 LAB — HEPARIN LEVEL (UNFRACTIONATED): Heparin Unfractionated: 0.34 IU/mL (ref 0.30–0.70)

## 2022-05-28 LAB — PROCALCITONIN: Procalcitonin: 1.56 ng/mL

## 2022-05-28 LAB — MAGNESIUM: Magnesium: 2.5 mg/dL — ABNORMAL HIGH (ref 1.7–2.4)

## 2022-05-28 MED ORDER — GLYCOPYRROLATE 0.2 MG/ML IJ SOLN: 0.2000 mg | INTRAMUSCULAR | Status: DC | PRN

## 2022-05-28 MED ORDER — PANTOPRAZOLE INFUSION (NEW) - SIMPLE MED
8.0000 mg/h | INTRAVENOUS | Status: DC
  Administered 2022-05-28: 8 mg/h via INTRAVENOUS
  Filled 2022-05-28: qty 100

## 2022-05-28 MED ORDER — FENTANYL CITRATE PF 50 MCG/ML IJ SOSY
25.0000 ug | PREFILLED_SYRINGE | INTRAMUSCULAR | Status: DC | PRN
  Administered 2022-05-28: 100 ug via INTRAVENOUS
  Filled 2022-05-28: qty 2

## 2022-05-28 MED ORDER — AMIODARONE IV BOLUS ONLY 150 MG/100ML
150.0000 mg | Freq: Once | INTRAVENOUS | Status: AC
  Administered 2022-05-28: 150 mg via INTRAVENOUS
  Filled 2022-05-28: qty 100

## 2022-05-28 MED ORDER — AMIODARONE HCL IN DEXTROSE 360-4.14 MG/200ML-% IV SOLN
60.0000 mg/h | INTRAVENOUS | Status: DC
  Administered 2022-05-28: 60 mg/h via INTRAVENOUS
  Filled 2022-05-28: qty 200

## 2022-05-28 MED ORDER — LACTATED RINGERS IV BOLUS
500.0000 mL | Freq: Once | INTRAVENOUS | Status: AC
  Administered 2022-05-28: 500 mL via INTRAVENOUS

## 2022-05-28 MED ORDER — STERILE WATER FOR INJECTION IV SOLN
INTRAVENOUS | Status: DC
  Filled 2022-05-28: qty 1000

## 2022-05-28 MED ORDER — AMIODARONE IV BOLUS ONLY 150 MG/100ML
150.0000 mg | Freq: Once | INTRAVENOUS | Status: AC
  Administered 2022-05-28: 150 mg via INTRAVENOUS

## 2022-05-28 MED ORDER — POLYVINYL ALCOHOL 1.4 % OP SOLN: 1.0000 [drp] | Freq: Four times a day (QID) | OPHTHALMIC | Status: DC | PRN

## 2022-05-28 MED ORDER — ACETAMINOPHEN 650 MG RE SUPP: 650.0000 mg | Freq: Four times a day (QID) | RECTAL | Status: DC | PRN

## 2022-05-28 MED ORDER — GLYCOPYRROLATE 1 MG PO TABS: 1.0000 mg | ORAL_TABLET | ORAL | Status: DC | PRN

## 2022-05-28 MED ORDER — PANTOPRAZOLE 80MG IVPB - SIMPLE MED
80.0000 mg | Freq: Once | INTRAVENOUS | Status: AC
  Administered 2022-05-28: 80 mg via INTRAVENOUS
  Filled 2022-05-28: qty 100

## 2022-05-28 MED ORDER — SODIUM BICARBONATE 8.4 % IV SOLN
INTRAVENOUS | Status: AC
  Administered 2022-05-28: 50 meq
  Filled 2022-05-28: qty 50

## 2022-05-28 MED ORDER — PHENYLEPHRINE CONCENTRATED 100MG/250ML (0.4 MG/ML) INFUSION SIMPLE
0.0000 ug/min | INTRAVENOUS | Status: DC
  Administered 2022-05-28: 400 ug/min via INTRAVENOUS
  Filled 2022-05-28: qty 250

## 2022-05-28 MED ORDER — METHYLPREDNISOLONE SODIUM SUCC 40 MG IJ SOLR
40.0000 mg | Freq: Every day | INTRAMUSCULAR | Status: DC
  Filled 2022-05-28: qty 1

## 2022-05-28 MED ORDER — PANTOPRAZOLE SODIUM 40 MG IV SOLR: 40.0000 mg | Freq: Two times a day (BID) | INTRAVENOUS | Status: DC

## 2022-05-28 MED ORDER — CALCIUM GLUCONATE-NACL 2-0.675 GM/100ML-% IV SOLN
2.0000 g | Freq: Once | INTRAVENOUS | Status: AC
  Administered 2022-05-28: 2000 mg via INTRAVENOUS
  Filled 2022-05-28: qty 100

## 2022-05-28 MED ORDER — HEPARIN BOLUS VIA INFUSION
1000.0000 [IU] | Freq: Once | INTRAVENOUS | Status: AC
  Administered 2022-05-28: 1000 [IU] via INTRAVENOUS
  Filled 2022-05-28: qty 1000

## 2022-05-28 MED ORDER — HYDROCORTISONE SOD SUC (PF) 100 MG IJ SOLR
100.0000 mg | Freq: Two times a day (BID) | INTRAMUSCULAR | Status: DC
  Administered 2022-05-28: 100 mg via INTRAVENOUS
  Filled 2022-05-28: qty 2

## 2022-05-28 MED ORDER — DEXTROSE 50 % IV SOLN
12.5000 g | INTRAVENOUS | Status: AC
  Administered 2022-05-28: 12.5 g via INTRAVENOUS

## 2022-05-28 MED ORDER — PHENYLEPHRINE HCL-NACL 20-0.9 MG/250ML-% IV SOLN
0.0000 ug/min | INTRAVENOUS | Status: DC
  Administered 2022-05-28: 20 ug/min via INTRAVENOUS
  Filled 2022-05-28 (×2): qty 250

## 2022-05-28 MED ORDER — ACETAMINOPHEN 325 MG PO TABS: 650.0000 mg | ORAL_TABLET | Freq: Four times a day (QID) | ORAL | Status: DC | PRN

## 2022-05-28 MED ORDER — DEXTROSE 10 % IV SOLN: INTRAVENOUS | Status: DC

## 2022-05-28 MED ORDER — AMIODARONE HCL IN DEXTROSE 360-4.14 MG/200ML-% IV SOLN: 30.0000 mg/h | INTRAVENOUS | Status: DC

## 2022-06-01 LAB — TYPE AND SCREEN
ABO/RH(D): A POS
Antibody Screen: NEGATIVE
Unit division: 0
Unit division: 0
Unit division: 0

## 2022-06-01 LAB — BPAM RBC
Blood Product Expiration Date: 202401142359
Blood Product Expiration Date: 202401162359
Blood Product Expiration Date: 202401162359
ISSUE DATE / TIME: 202312191020
Unit Type and Rh: 6200
Unit Type and Rh: 6200
Unit Type and Rh: 9500

## 2022-06-06 ENCOUNTER — Ambulatory Visit (HOSPITAL_COMMUNITY): Payer: Medicare Other | Admitting: Nurse Practitioner

## 2022-06-10 NOTE — Progress Notes (Signed)
ANTICOAGULATION CONSULT NOTE  Pharmacy Consult for Heparin Indication: atrial fibrillation, NSTEMI  Allergies  Allergen Reactions   Diclofenac Diarrhea   Lipitor [Atorvastatin] Other (See Comments)    Muscle pain   Livalo [Pitavastatin] Other (See Comments)    Muscle aches   Statins     Muscle aches    Patient Measurements: Height: '5\' 10"'$  (177.8 cm) Weight: 80.2 kg (176 lb 12.9 oz) IBW/kg (Calculated) : 73 Heparin Dosing Weight: 81.1 kg  Vital Signs: Temp: 96.1 F (35.6 C) (12/19 1046) Temp Source: Esophageal (12/19 1046) BP: 135/38 (12/19 0700) Pulse Rate: 100 (12/19 1045)  Labs: Recent Labs    05/26/22 0745 05/27/22 0754 05/27/22 1039 05/27/22 1154 05/27/22 1617 05/27/22 2305 June 23, 2022 0415 23-Jun-2022 0427 06/23/22 0903 Jun 23, 2022 1015  HGB 8.1* 9.1*  --   --   --   --   --  8.8*  --  8.5*  HCT 24.5* 29.5*  --   --   --   --   --  28.6*  --  27.1*  PLT 241 256  --   --   --   --   --  131*  --  119*  APTT 76*  --   --  37*  --  52*  --   --  65*  --   HEPARINUNFRC >1.10*  --   --  0.37  --   --  0.34  --   --   --   CREATININE 3.14*  --  5.44*  --  5.17*  --   --  3.52*  3.53*  --   --   TROPONINIHS  --   --  >24,000*  --   --   --   --   --   --   --      Estimated Creatinine Clearance: 17.8 mL/min (A) (by C-G formula based on SCr of 3.53 mg/dL (H)).  Assessment: AC/Heme: Eliquis PTA (LD 12/14 mid-day) for h/o afib>> 12/14 to IV heparin which was stopped 12/17 due to bloody sputum 12/14-12/16 Hgb 8.2 >7.1 transfuse 12/15>>Hgb 8.8 12/18 ACS with elevated troponins; Pharmacy re-consulted to dose IV heparin for NSTEMI    - Heparin level 0.37 - still elevated from Eliquis    - aPTT 37 - appropriately sub-therapeutic prior to restarting heparin  2022-06-23 09:03 aPTT 65, slightly subtherapeutic on 1150 units/hr IV heparin currently on hold per Dr Silas Flood due to bleeding from OGT  Goal of Therapy:  aPTT 66-102 seconds Monitor platelets by anticoagulation  protocol: Yes Heparin level  0.3-0.7   Plan:  Discontinue IV heparin per Dr Silas Flood     Thank you for allowing pharmacy to be a part of this patient's care.  Dolly Rias RPh 06/23/2022, 11:31 AM

## 2022-06-10 NOTE — IPAL (Addendum)
  Interdisciplinary Goals of Care Family Meeting   Date carried out: 03-Jun-2022  Location of the meeting: Bedside  Member's involved: Physician Assistant, Bedside RN, pt's spouse, pt's daughter.  Durable Power of Tour manager: Spouse.    Discussion: We discussed goals of care for Samuel [name]. I have had an extensive discussion with spouse and daughter. We discussed [name] current circumstances and organ failures. We also discussed patient's prior wishes under circumstances such as this. The family has decided not to perform resuscitation if arrest were to occur, but to otherwise continue with current medical support / therapies. They are awaiting their chaplain to arrive and after meeting together, will likely transition to comfort measures.   Code status:   Code Status: DNR   Disposition: Continue current acute care  Time spent for the meeting: 10 minutes.   ADDENDUM 1209: Pastor at bedside and has met with spouse and daughter. Family is ready to transition to comfort care. The process has been explained in detail and all questions answered. Emotional support provided and condolences expressed to the family.    Montey Hora, PA-C  06/03/22, 11:47 AM

## 2022-06-10 NOTE — Progress Notes (Addendum)
NAME:  Samuel [name], MRN:  [id number], DOB:  07-28-1945, LOS: 5 ADMISSION DATE:  05/13/2022, CONSULTATION DATE: 06/06/2022 REFERRING MD: Dr. Cyd Silence, CHIEF COMPLAINT: Respiratory distress  History of Present Illness:  77 yo M hx AAA, PAD, HTN recently admitted to [facility] 12/5-12/11 with multifocal PNA and hypoxia, of unclear etiology but received course of abx (rocephin, azithro), presented to [facility] ED 12/14 with worse SOB. CXR in ed shows significant progression of bilat infiltrates from prior, concerning for ARDS.  He was hypoxic and placed on BiPAP. Initially planned to be admitted to [facility] but quickly declined despite bipap. PCCM was called urgently for respiratory distress    Last admisison developed new Afib RVR, dc with eliquis Also dc with new O2 req RVP, Covid, flu, legionella, strep ag were neg for that admission.     In d/w wife, she notes that pt was handling raw deer meat before prior admission and seems like he has been declining since. Isnt sure if this might be related. She discussed intubation and code status with Dr. Cyd Silence and endorses full code / full scope offered treatment, which she also confirmed with me prior to intubation.    Pertinent  Medical History   Past Medical History:  Diagnosis Date   AAA (abdominal aortic aneurysm) ([place])    Carotid artery occlusion    Carotid artery stenosis 05/14/2022   Jun 05, 2019 Entered By: Ria Clock Comment: Right carotid endarterectomy.   Essential hypertension 10/16/2007   Qualifier: Diagnosis of   By: Danelle Earthly CMA, Darlene       Genital warts    history of   Glucose intolerance 05/14/2022   History of carotid stenosis    s/p right carotid endarterectomy 12/06   Hyperlipidemia    Hypertension    PAD (peripheral artery disease) ([place])    Left leg   Peripheral vascular disease ([place]) 10/16/2007   Centricity Description: INTERMITTENT CLAUDICATION, LEFT LEG  Qualifier: Diagnosis of   By: Danelle Earthly CMA, Darlene       Centricity Description: UNSPECIFIED PERIPHERAL VASCULAR DISEASE  Qualifier: Diagnosis of   By: Bertell Maria Events: Including procedures, antibiotic start and stop dates in addition to other pertinent events   Chest x-ray with extensive bilateral infiltrate Recent CT scan with extensive BL infiltrate, pleural effusions echocardiogram 89/08/8099 with diastolic dysfunction grade 2 ETT 12/14 - 12/16 12/17 reintubation after probable flash pulmonary edema, failed BiPAP 12/18 worsening shock, on multiple pressors, hyperkalemic with worsening renal failure, CRRT started, found to have NSTEMI, cards consulted and started on heparin. AFRVR overnight.  Interim History / Subjective:  On vent, stable. CRRT ongoing. Remains on 33 Levo, 0.03 Vaso.  Objective   Blood pressure (!) 135/38, pulse 75, temperature (!) 97.2 F (36.2 C), resp. rate (!) 33, height '5\' 10"'$  (1.778 m), weight 80.2 kg, SpO2 98 %.    Vent Mode: PRVC FiO2 (%):  [40 %-50 %] 50 % Set Rate:  [20 bmp-35 bmp] 35 bmp Vt Set:  [460 mL] 460 mL PEEP:  [8 cmH20] 8 cmH20 Plateau Pressure:  [21 cmH20-31 cmH20] 21 cmH20   Intake/Output Summary (Last 24 hours) at June 09, 2022 0721 Last data filed at 2022/06/09 0700 Gross per 24 hour  Intake 1846.35 ml  Output 1899.3 ml  Net -52.95 ml    Filed Weights   05/25/22 0500 05/27/22 0500 06/09/22 0412  Weight: 82.4 kg 81.1 kg 80.2 kg   Examination: General: Adult male, critically ill  but in NAD. Neuro: Sedated, does open eyes to voice but does not follow commands. HEENT: [place]/AT. Sclerae anicteric. ETT in place. Cardiovascular: RRR, no M/R/G.  Lungs: Respirations even and unlabored.  CTA bilaterally, No W/R/R. Abdomen: BS x 4, soft, NT/ND.  Musculoskeletal: No gross deformities, no edema.  Skin: Intact, warm, no rashes.  Assessment & Plan:   Possible HCAP - RVP neg, respiratory culture negative, urine strep antigen neg Previous CT from 2020 suggests mild  ILD but improvement in infiltrates makes IPF flare less likely Flash pulmonary edema 12/17, failed BiPAP and required reintubation. - Continue vent support, not stable enough for SBT. - Continue cefepime empiric x 7 ds. - Repeat PCT, lactate. - Continue steroids, change drom Decadron q12hrs to stress dose Solucortef. - F/u on Legionella for completion. - HRCT chest at some point if he is able to maintain on high flow nasal cannula.  Acute kidney injury on chronic kidney disease (baseline creatinine 1.3 on 12/5) - Likely contrast-induced + volume overload. Started on CRRT 12/18. AGMA - presumed 2/2 renal failure. - Continue CRRT, appreciate nephro assistance. - Pull volume as able, currently +2.2L. - Avoid nephrotoxic agents. - Follow BMP.  Shock - presume multifactorial in setting HCAP + component cardiogenic 2/2 NSTEMI (though EF per echo normal). - Continue Levophed and Vaso for goal MAP > 65. - Repeat PCT, lactate as above. - Check co-ox and CVP. - LHC at some point once renal function improves and more stable.  NSTEMI - presumed significant CAD but unfortunately not a candidate for LHC at this time. Chronic diastolic heart failure - new echo 12/18 with EF 55-60%, G2DD. Hypertension. - Cards following, appreciate the assistance. - Continue Heparin. - Volume removal per CRRT as able. - Follow BMP. - Strict I/O's. - Hold bisoprolol and amlodipine.  New onset atrial fibrillation developed during last admission 12/5 - recurred overnight 12/18, converted to NSR after amio bolus. - Heparin gtt.  Anemia of critical illness - Follow, Transfuse for Hgb < 7.  Nutrition. - TFs on hold give increase in pressor requirements etc. Will revisit tomorrow  Best Practice (right click and "Reselect all SmartList Selections" daily)   Diet/type: tubefeeds potentially 12/20 DVT prophylaxis: systemic heparin GI prophylaxis: PPI Lines: N/A Foley:  N/A Code Status:  full code Last date of  multidisciplinary goals of care discussion [pending] I updated spouse at bedside 12/18, daughter updated at bedside 12/19   CC time: 35 min.   Montey Hora, [facility] For pager details, please see AMION or use Epic chat  After 1900, please call [facility] for cross coverage needs 2022/06/05, 7:21 AM

## 2022-06-10 NOTE — Death Summary Note (Signed)
DEATH SUMMARY   Patient Details  Name: Samuel [name] MRN: [id number] DOB: 1946/02/15  Admission/Discharge Information   Admit Date:  May 26, 2022  Date of Death: Date of Death: 31-May-2022  Time of Death: Time of Death: 20  Length of Stay: 5  Referring Physician: [name], MD   Reason(s) for Hospitalization  Acute hypoxemic respiratory failure  Diagnoses  Preliminary cause of death:  Secondary Diagnoses (including complications and co-morbidities):  Principal Problem:   Acute liver failure Active Problems:   DIABETES MELLITUS, TYPE II, BORDERLINE   AAA (abdominal aortic aneurysm) ([place])   Acute respiratory failure with hypoxia (HCC)   Atrial fibrillation with RVR ([place])   Sepsis with acute hypoxic respiratory failure without septic shock ([place])   ARDS (adult respiratory distress syndrome) ([place])   AKI (acute kidney injury) ([place])   Shock ([place])   DNR (do not resuscitate)   Comfort measures only status   Brief Hospital Course (including significant findings, care, treatment, and services provided and events leading to death)  Samuel [name] is a 77 y.o. year old male who had recent admission for congestive heart failure with decompensation who was readmitted shortly after with concern for pneumonia leading to intubation and septic shock.  Clinical decompensation 05/27/2022.  Rising pressor requirement leading to central line placement.  Worsening renal failure leading to HD line placement.  CRRT started 05/27/2022.  Rising pressor requirement throughout the night.  On 2022/05/31 third pressor was added.  CRRT was stopped due to ongoing hypotension.  Blood was returned.  Bolus was given.  Remained hypotensive despite 3 pressors.  Had bloody OG output.  Heparin was stopped.  Repeat hemoglobin was stable.  Repeat CMP revealed acute liver failure with AST undetectably high, greater than thousand and ALT in the 7000 range.  He had refractory shock and acidemia.  Multiple family  conversations had.  Eventually, decision was made to change CODE STATUS to DNR and to elect for comfort measures and compassionate extubation.  He was extubated and pressors were stopped.  She passed shortly thereafter.  Surrounded by loved ones.    Pertinent Labs and Studies  Significant Diagnostic Studies DG Abd 1 View  Result Date: 05-31-22 CLINICAL DATA:  Tube placement EXAM: ABDOMEN - 1 VIEW COMPARISON:  2022/05/26. FINDINGS: Demonstrates a NG tube tip superimposed with the stomach the bowel gas pattern is normal. No image of the upper abdomen. IMPRESSION: NG tube tip overlies the stomach. Electronically Signed   By: Sammie Bench M.D.   On: May 31, 2022 09:48   DG CHEST PORT 1 VIEW  Result Date: 2022-05-31 CLINICAL DATA:  [id number] Respiratory failure ([place]) [id number] EXAM: PORTABLE CHEST 1 VIEW COMPARISON:  May 27 2022 FINDINGS: The cardiomediastinal silhouette is unchanged in contour.ETT tip terminates 8.2 cm above the carina just at the level the thoracic inlet. Enteric tube tip and side port project over the proximal stomach. LEFT neck CVC tip terminates over the expected confluence of the LEFT brachiocephalic vein and SVC. No pleural effusion. No pneumothorax. Diffuse interstitial opacities, similar in comparison to prior. IMPRESSION: 1. Support apparatus as described above. ETT tip terminates approximately 8.2 cm above the carina at the level of the thoracic inlet. 2. Similar appearance of diffuse interstitial opacities. Differential considerations include pulmonary edema versus atypical infection. Electronically Signed   By: Valentino Saxon M.D.   On: May 31, 2022 07:42   ECHOCARDIOGRAM COMPLETE  Result Date: 05/27/2022    ECHOCARDIOGRAM REPORT   Patient Name:   Samuel [name] Date of  Exam: 05/27/2022 Medical Rec #:  [id number]        Height:       70.0 in Accession #:    [id number]       Weight:       178.8 lb Date of Birth:  15-Jan-1946        BSA:          1.990 m Patient Age:     77 years         BP:           115/42 mmHg Patient Gender: M                HR:           81 bpm. Exam Location:  Inpatient Procedure: 2D Echo, Cardiac Doppler and Color Doppler                      STAT ECHO Reported to: Dr Marry Guan on 05/27/2022 6:11:00 PM. Indications:    CHF-acute diastolic  History:        Patient has prior history of Echocardiogram examinations. Risk                 Factors:Hypertension, Diabetes, Dyslipidemia and Former Smoker.                 AKI. ARDS. Acute respiratory failure with hypoxia.  Sonographer:    Clayton Lefort RDCS (AE) Referring Phys: [id number] [facility]  Sonographer Comments: Echo performed with patient supine and on artificial respirator. IMPRESSIONS  1. Left ventricular ejection fraction, by estimation, is 55 to 60%. The left ventricle has normal function. The left ventricle has no regional wall motion abnormalities. There is moderate concentric left ventricular hypertrophy. Left ventricular diastolic parameters are consistent with Grade II diastolic dysfunction (pseudonormalization).  2. Right ventricular systolic function is normal. The right ventricular size is mildly enlarged. Tricuspid regurgitation signal is inadequate for assessing PA pressure.  3. The mitral valve is degenerative. Trivial mitral valve regurgitation. No evidence of mitral stenosis.  4. The aortic valve is calcified. Aortic valve regurgitation is not visualized. Aortic valve sclerosis/calcification is present, without any evidence of aortic stenosis. Comparison(s): No significant change from prior study. FINDINGS  Left Ventricle: Left ventricular ejection fraction, by estimation, is 55 to 60%. The left ventricle has normal function. The left ventricle has no regional wall motion abnormalities. The left ventricular internal cavity size was normal in size. There is  moderate concentric left ventricular hypertrophy. Left ventricular diastolic parameters are consistent with Grade II diastolic dysfunction  (pseudonormalization). Right Ventricle: The right ventricular size is mildly enlarged. No increase in right ventricular wall thickness. Right ventricular systolic function is normal. Tricuspid regurgitation signal is inadequate for assessing PA pressure. Left Atrium: Left atrial size was normal in size. Right Atrium: Right atrial size was normal in size. Pericardium: There is no evidence of pericardial effusion. Mitral Valve: The mitral valve is degenerative in appearance. Trivial mitral valve regurgitation. No evidence of mitral valve stenosis. Tricuspid Valve: The tricuspid valve is grossly normal. Tricuspid valve regurgitation is mild . No evidence of tricuspid stenosis. Aortic Valve: The aortic valve is calcified. Aortic valve regurgitation is not visualized. Aortic valve sclerosis/calcification is present, without any evidence of aortic stenosis. Aortic valve mean gradient measures 7.0 mmHg. Aortic valve peak gradient measures 14.4 mmHg. Aortic valve area, by VTI measures 1.89 cm. Pulmonic Valve: The pulmonic valve was grossly normal. Pulmonic valve regurgitation is not visualized.  No evidence of pulmonic stenosis. Aorta: The aortic root is normal in size and structure. Venous: IVC assessment for right atrial pressure unable to be performed due to mechanical ventilation. IAS/Shunts: The atrial septum is grossly normal.  LEFT VENTRICLE PLAX 2D LVIDd:         3.40 cm      Diastology LVIDs:         2.70 cm      LV e' medial:    5.33 cm/s LV PW:         1.40 cm      LV E/e' medial:  18.9 LV IVS:        1.40 cm      LV e' lateral:   5.00 cm/s LVOT diam:     2.00 cm      LV E/e' lateral: 20.2 LV SV:         56 LV SV Index:   28 LVOT Area:     3.14 cm  LV Volumes (MOD) LV vol d, MOD A2C: 125.0 ml LV vol d, MOD A4C: 73.7 ml LV vol s, MOD A2C: 47.1 ml LV vol s, MOD A4C: 41.1 ml LV SV MOD A2C:     77.9 ml LV SV MOD A4C:     73.7 ml LV SV MOD BP:      50.6 ml RIGHT VENTRICLE             IVC RV Basal diam:  3.40 cm      IVC diam: 1.80 cm RV S prime:     11.00 cm/s TAPSE (M-mode): 2.0 cm LEFT ATRIUM             Index        RIGHT ATRIUM           Index LA diam:        3.30 cm 1.66 cm/m   RA Area:     18.30 cm LA Vol (A2C):   39.7 ml 19.95 ml/m  RA Volume:   60.10 ml  30.20 ml/m LA Vol (A4C):   48.4 ml 24.32 ml/m LA Biplane Vol: 45.1 ml 22.66 ml/m  AORTIC VALVE AV Area (Vmax):    1.93 cm AV Area (Vmean):   1.80 cm AV Area (VTI):     1.89 cm AV Vmax:           190.00 cm/s AV Vmean:          124.000 cm/s AV VTI:            0.298 m AV Peak Grad:      14.4 mmHg AV Mean Grad:      7.0 mmHg LVOT Vmax:         117.00 cm/s LVOT Vmean:        71.100 cm/s LVOT VTI:          0.179 m LVOT/AV VTI ratio: 0.60  AORTA Ao Root diam: 3.30 cm MITRAL VALVE MV Area (PHT): 2.82 cm     SHUNTS MV Decel Time: 269 msec     Systemic VTI:  0.18 m MV E velocity: 101.00 cm/s  Systemic Diam: 2.00 cm MV A velocity: 86.50 cm/s MV E/A ratio:  1.17 Eleonore Chiquito MD Electronically signed by Eleonore Chiquito MD Signature Date/Time: 05/27/2022/6:29:16 PM    Final    DG CHEST PORT 1 VIEW  Result Date: 05/27/2022 CLINICAL DATA:  [id number] Encounter for central line placement [id number] EXAM: PORTABLE CHEST 1 VIEW COMPARISON:  May 27, 2022  FINDINGS: The cardiomediastinal silhouette is unchanged in contour.Atherosclerotic calcifications of the aorta. ETT tip terminates 7 cm above the carina. The enteric tube courses through the chest to the abdomen beyond the field-of-view. LEFT neck CVC tip terminates over the expected region of the confluence of the LEFT brachiocephalic and SVC. No pleural effusion. No pneumothorax. Similar appearance of hazy bilateral predominately interstitial opacities. IMPRESSION: LEFT neck CVC tip terminates over the expected region of the confluence of the LEFT brachiocephalic and SVC. Electronically Signed   By: Valentino Saxon M.D.   On: 05/27/2022 14:43   DG Chest Port 1 View  Result Date: 05/27/2022 CLINICAL DATA:  Acute  respiratory failure.  Respiratory distress. EXAM: PORTABLE CHEST 1 VIEW COMPARISON:  Chest x-rays dated 05/26/2022 and 05/25/2022. Chest CT dated 05/14/2022. FINDINGS: Endotracheal tube is stable in position with tip approximately 6 cm above the level of the carina. Enteric tube passes into the stomach. Diffuse bilateral airspace opacities, predominantly interstitial, are not significantly changed compared to yesterday's most recent chest x-ray, questionably decreased compared to yesterday's morning chest x-ray. No new lung findings. No pneumothorax is seen. Heart size and mediastinal contours appear stable. IMPRESSION: 1. Diffuse bilateral airspace opacities, predominantly interstitial, compatible with bilateral multifocal pneumonia, not significantly changed. 2. No new lung findings. 3. Support apparatus appears stable in position. Electronically Signed   By: Franki Cabot M.D.   On: 05/27/2022 08:13   Portable Chest x-ray  Result Date: 05/26/2022 CLINICAL DATA:  Endotracheal tube EXAM: PORTABLE CHEST 1 VIEW COMPARISON:  Chest x-ray 05/26/2022 FINDINGS: Endotracheal tube tip is proximally 6 cm above the carina. Enteric tube extends below the diaphragm with distal tip in the mid stomach. Cardiomediastinal silhouette is within normal limits. Diffuse bilateral airspace opacities in the mid and lower lungs have mildly decreased. No pleural effusion or pneumothorax. No acute fractures are seen. IMPRESSION: 1. Endotracheal tube tip is 6 cm above the carina. 2. Enteric tube tip in the mid stomach. 3. Diffuse bilateral airspace opacities in the mid and lower lungs have mildly decreased. Electronically Signed   By: Ronney Asters M.D.   On: 05/26/2022 19:20   DG CHEST PORT 1 VIEW  Result Date: 05/26/2022 CLINICAL DATA:  Hypoxia EXAM: PORTABLE CHEST 1 VIEW COMPARISON:  05/25/2022, chest CT 05/14/2022, radiograph 05/24/2022, 05/22/2022 FINDINGS: Cardiomegaly. Widespread heterogeneous bilateral airspace disease  appears slightly worse compared to yesterday's radiograph. Bilateral effusions. No pneumothorax. IMPRESSION: Widespread heterogeneous bilateral airspace disease, slightly worse compared to yesterday's radiograph. Bilateral effusions and cardiomegaly otherwise unchanged. Electronically Signed   By: Donavan Foil M.D.   On: 05/26/2022 18:31   DG CHEST PORT 1 VIEW  Result Date: 05/25/2022 CLINICAL DATA:  Productive cough EXAM: PORTABLE CHEST 1 VIEW COMPARISON:  05/24/2022, chest CT 05/14/2022 FINDINGS: The previously noted endotracheal and esophageal tubes are no longer visualized. Enlarged cardiomediastinal silhouette. Widespread heterogeneous interstitial and ground-glass opacity slightly improved on the right compared with yesterday's radiograph. Small pleural effusions. No visible pneumothorax. IMPRESSION: 1. Widespread heterogeneous interstitial and ground-glass opacity, slightly improved on the right compared with yesterday's radiograph. Small pleural effusions. 2. Enlarged cardiomediastinal silhouette. 3. Previously noted endotracheal and esophageal tubes are no longer visualized Electronically Signed   By: Donavan Foil M.D.   On: 05/25/2022 15:55   DG CHEST PORT 1 VIEW  Result Date: 05/24/2022 CLINICAL DATA:  23536 with ARDS. EXAM: PORTABLE CHEST 1 VIEW COMPARISON:  Portable chest yesterday at 6:43 p.m. FINDINGS: 4:17 a.m. ETT tip is 3.4 cm from the carina. NGT appears  to extend into the duodenum, with the side hole and tip not included in the exam. There is mild cardiomegaly, mild central vascular prominence. Diffuse interstitial and airspace opacities are again noted with relative apical sparing, small pleural effusions. The opacities are slightly less dense today, minimally less confluent. No new or worsened opacity is seen. The pleural effusions are unchanged. The mediastinum is stable. Calcification again noted transverse aorta. Thoracic spondylosis. IMPRESSION: 1. Diffuse interstitial and  airspace opacities are slightly less dense today, minimally less confluent. No new or worsening opacity. 2. Stable pleural effusions. 3. Stable cardiomegaly and central vascular prominence. Electronically Signed   By: Telford Nab M.D.   On: 05/24/2022 05:16   DG CHEST PORT 1 VIEW  Result Date: 06/04/2022 CLINICAL DATA:  Intubation and OG tube placement EXAM: PORTABLE CHEST 1 VIEW COMPARISON:  05/13/2022 at 1541 hours FINDINGS: Interval placement of endotracheal tube terminating approximately 5.3 cm above the carina. Enteric tube courses below the diaphragm with distal tip beyond the inferior margin of the film. Stable heart size. Widespread bilateral airspace opacities. Small bilateral pleural effusions. No pneumothorax. IMPRESSION: 1. Interval placement of endotracheal and enteric tubes in satisfactory position. 2. Widespread bilateral airspace opacities, similar to prior. Electronically Signed   By: Davina Poke D.O.   On: 05/22/2022 18:57   DG Abd 1 View  Result Date: 05/11/2022 CLINICAL DATA:  Enteric catheter placement EXAM: ABDOMEN - 1 VIEW COMPARISON:  05/21/2022 FINDINGS: Frontal view of the lower chest and upper abdomen demonstrates enteric catheter passing below diaphragm with tip and side port projecting over the gastric antrum. Endotracheal tube tip overlies tracheal air column, well above carina. Proximal extent of endoluminal aortic stent graft identified. Widespread bilateral airspace disease unchanged since prior exam. Bowel gas pattern is unremarkable. IMPRESSION: 1. Enteric catheter tip projecting over the gastric antrum. 2. Widespread bilateral airspace disease unchanged. Electronically Signed   By: Randa Ngo M.D.   On: 05/27/2022 18:57   DG Chest Port 1 View  Result Date: 05/11/2022 CLINICAL DATA:  Hypoxia.  Recently treated for pneumonia. EXAM: PORTABLE CHEST 1 VIEW COMPARISON:  Chest radiograph dated 05/20/2022 FINDINGS: Increased bilateral airspace opacities. Normal  lung volumes. Trace bilateral pleural effusions. No pneumothorax. Similar cardiomediastinal silhouette. The visualized skeletal structures are unremarkable. IMPRESSION: 1. Increased bilateral airspace opacities, suspicious for multifocal infection. 2. Trace bilateral pleural effusions. Electronically Signed   By: Darrin Nipper M.D.   On: 05/31/2022 15:51   VAS US RENAL ARTERY DUPLEX  Result Date: 05/20/2022 ABDOMINAL VISCERAL Patient Name:  Samuel [name]  Date of Exam:   05/20/2022 Medical Rec #: [id number]         Accession #:    [id number] Date of Birth: Nov 05, 1945         Patient Gender: M Patient Age:   68 years Exam Location:  [facility] Procedure:      VAS US RENAL ARTERY DUPLEX Referring Phys: [place] -------------------------------------------------------------------------------- High Risk Factors: Hypertension, hyperlipidemia, past history of smoking. Other Factors: PAD, Carotid stenosis s/p RT CEA, Afib, AAA s/p EVAR. Limitations: Respiratory variations. Comparison Study: No previous exams Performing Technologist: Jody Hill RVT, RDMS  Examination Guidelines: A complete evaluation includes B-mode imaging, spectral Doppler, color Doppler, and power Doppler as needed of all accessible portions of each vessel. Bilateral testing is considered an integral part of a complete examination. Limited examinations for reoccurring indications may be performed as noted.  Duplex Findings: +--------------------+--------+--------+------+--------+ Mesenteric  PSV cm/sEDV cm/sPlaqueComments +--------------------+--------+--------+------+--------+ Aorta Prox             55      12                  +--------------------+--------+--------+------+--------+ Celiac Artery Origin  242      60                  +--------------------+--------+--------+------+--------+ SMA Proximal          457      56                  +--------------------+--------+--------+------+--------+ SMA  Mid               115      21                  +--------------------+--------+--------+------+--------+ SMA Distal             94      21                  +--------------------+--------+--------+------+--------+    +------------------+--------+--------+-------+ Right Renal ArteryPSV cm/sEDV cm/sComment +------------------+--------+--------+-------+ Origin              170      38           +------------------+--------+--------+-------+ Proximal            169      36           +------------------+--------+--------+-------+ Mid                  71      20           +------------------+--------+--------+-------+ Distal               58      21           +------------------+--------+--------+-------+ +-----------------+--------+--------+-------+ Left Renal ArteryPSV cm/sEDV cm/sComment +-----------------+--------+--------+-------+ Origin             154      40           +-----------------+--------+--------+-------+ Proximal            74      24           +-----------------+--------+--------+-------+ Mid                 74      25           +-----------------+--------+--------+-------+ Distal              77      25           +-----------------+--------+--------+-------+ +------------+--------+--------+----+-----------+--------+--------+----+ Right KidneyPSV cm/sEDV cm/sRI  Left KidneyPSV cm/sEDV cm/sRI   +------------+--------+--------+----+-----------+--------+--------+----+ Upper Pole  23      8       0.65Upper Pole 13      4       0.67 +------------+--------+--------+----+-----------+--------+--------+----+ Mid         71      22      0.71Mid        15      5       0.64 +------------+--------+--------+----+-----------+--------+--------+----+ Lower Pole  28      8       0.73Lower Pole 14      5       0.64 +------------+--------+--------+----+-----------+--------+--------+----+ Hilar       53  12      0.78Hilar                            +------------+--------+--------+----+-----------+--------+--------+----+ +------------------+-----+------------------+----+ Right Kidney           Left Kidney            +------------------+-----+------------------+----+ RAR                    RAR                    +------------------+-----+------------------+----+ RAR (manual)      3.09 RAR (manual)      2.8  +------------------+-----+------------------+----+ Cortex            17/6 Cortex            11/5 +------------------+-----+------------------+----+ Cortex thickness       Corex thickness        +------------------+-----+------------------+----+ Kidney length (cm)10.34Kidney length (cm)9.50 +------------------+-----+------------------+----+  Summary: Renal:  Right: 1-59% stenosis of the right renal artery. Abnormal right        Resistive Index. Normal size right kidney. RRV flow present. Left:  1-59% stenosis of the left renal artery. Normal left        Resistive Index. Normal size of left kidney. LRV flow        present. 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  *See table(s) above for measurements and observations.  Diagnosing physician: Monica Martinez MD  Electronically signed by Monica Martinez MD on 05/20/2022 at 1:53:54 PM.    Final    DG CHEST PORT 1 VIEW  Result Date: 05/20/2022 CLINICAL DATA:  Shortness of breath.  History of hypertension. EXAM: PORTABLE CHEST 1 VIEW COMPARISON:  Radiographs and CT 05/14/2022.  Radiographs 11/19/2018. FINDINGS: 1120 hours. The heart size and mediastinal contours are stable with aortic atherosclerosis. There are persistent diffuse bilateral airspace opacities with minimal change compared with the prior studies of 6 days ago. Small to moderate bilateral pleural effusions. No pneumothorax. There are degenerative changes within the spine. Telemetry leads overlie the chest. IMPRESSION: Persistent diffuse  bilateral airspace opacities and bilateral pleural effusions with minimal change compared with recent prior studies. Findings may reflect edema, inflammation or multilobar pneumonia. Electronically Signed   By: Richardean Sale M.D.   On: 05/20/2022 11:36   ECHOCARDIOGRAM COMPLETE  Result Date: 05/15/2022    ECHOCARDIOGRAM REPORT   Patient Name:   Samuel [name] Date of Exam: 05/15/2022 Medical Rec #:  [id number]        Height:       70.0 in Accession #:    [id number]       Weight:       178.6 lb Date of Birth:  Jul 07, 1945        BSA:          1.989 m Patient Age:    80 years         BP:           178/64 mmHg Patient Gender: M                HR:           90 bpm. Exam Location:  Inpatient Procedure: 2D Echo, Cardiac Doppler and Color Doppler Indications:    Abnl ECG  History:        Patient has prior history of Echocardiogram examinations, most  recent 05/17/2005. Abnormal ECG; Risk Factors:Hypertension,                 Diabetes, Dyslipidemia and Former Smoker.  Sonographer:    Wenda Low Referring Phys: [id number] [place]  1. Left ventricular ejection fraction, by estimation, is 60 to 65%. The left ventricle has normal function. The left ventricle has no regional wall motion abnormalities. There is moderate concentric left ventricular hypertrophy. Left ventricular diastolic parameters are consistent with Grade II diastolic dysfunction (pseudonormalization).  2. Right ventricular systolic function is normal. The right ventricular size is normal. There is normal pulmonary artery systolic pressure. The estimated right ventricular systolic pressure is 45.3 mmHg.  3. Left atrial size was mildly dilated.  4. Right atrial size was mildly dilated.  5. The mitral valve is normal in structure. No evidence of mitral valve regurgitation. No evidence of mitral stenosis.  6. The aortic valve is tricuspid. There is moderate calcification of the aortic valve. Aortic valve regurgitation is  not visualized. Mild aortic valve stenosis. Aortic valve mean gradient measures 12.0 mmHg.  7. The inferior vena cava is normal in size with greater than 50% respiratory variability, suggesting right atrial pressure of 3 mmHg. FINDINGS  Left Ventricle: Left ventricular ejection fraction, by estimation, is 60 to 65%. The left ventricle has normal function. The left ventricle has no regional wall motion abnormalities. The left ventricular internal cavity size was normal in size. There is  moderate concentric left ventricular hypertrophy. Left ventricular diastolic parameters are consistent with Grade II diastolic dysfunction (pseudonormalization). Right Ventricle: The right ventricular size is normal. No increase in right ventricular wall thickness. Right ventricular systolic function is normal. There is normal pulmonary artery systolic pressure. The tricuspid regurgitant velocity is 2.64 m/s, and  with an assumed right atrial pressure of 3 mmHg, the estimated right ventricular systolic pressure is 64.6 mmHg. Left Atrium: Left atrial size was mildly dilated. Right Atrium: Right atrial size was mildly dilated. Pericardium: There is no evidence of pericardial effusion. Mitral Valve: The mitral valve is normal in structure. Mild mitral annular calcification. No evidence of mitral valve regurgitation. No evidence of mitral valve stenosis. MV peak gradient, 9.0 mmHg. The mean mitral valve gradient is 3.0 mmHg. Tricuspid Valve: The tricuspid valve is normal in structure. Tricuspid valve regurgitation is trivial. Aortic Valve: The aortic valve is tricuspid. There is moderate calcification of the aortic valve. Aortic valve regurgitation is not visualized. Mild aortic stenosis is present. Aortic valve mean gradient measures 12.0 mmHg. Aortic valve peak gradient measures 20.3 mmHg. Aortic valve area, by VTI measures 2.16 cm. Pulmonic Valve: The pulmonic valve was normal in structure. Pulmonic valve regurgitation is not  visualized. Aorta: The aortic root is normal in size and structure. Venous: The inferior vena cava is normal in size with greater than 50% respiratory variability, suggesting right atrial pressure of 3 mmHg. IAS/Shunts: No atrial level shunt detected by color flow Doppler.  LEFT VENTRICLE PLAX 2D LVIDd:         4.70 cm   Diastology LVIDs:         3.40 cm   LV e' medial:    5.44 cm/s LV PW:         1.30 cm   LV E/e' medial:  23.9 LV IVS:        1.40 cm   LV e' lateral:   7.51 cm/s LVOT diam:     2.00 cm   LV E/e' lateral: 17.3 LV SV:  96 LV SV Index:   48 LVOT Area:     3.14 cm  RIGHT VENTRICLE RV Basal diam:  3.85 cm RV Mid diam:    3.60 cm RV S prime:     18.00 cm/s TAPSE (M-mode): 3.2 cm LEFT ATRIUM             Index        RIGHT ATRIUM           Index LA diam:        4.20 cm 2.11 cm/m   RA Area:     20.70 cm LA Vol (A2C):   76.2 ml 38.31 ml/m  RA Volume:   63.90 ml  32.12 ml/m LA Vol (A4C):   66.0 ml 33.18 ml/m LA Biplane Vol: 71.0 ml 35.69 ml/m  AORTIC VALVE                     PULMONIC VALVE AV Area (Vmax):    1.91 cm      PV Vmax:       1.17 m/s AV Area (Vmean):   1.96 cm      PV Peak grad:  5.5 mmHg AV Area (VTI):     2.16 cm AV Vmax:           225.33 cm/s AV Vmean:          154.000 cm/s AV VTI:            0.446 m AV Peak Grad:      20.3 mmHg AV Mean Grad:      12.0 mmHg LVOT Vmax:         137.00 cm/s LVOT Vmean:        96.300 cm/s LVOT VTI:          0.307 m LVOT/AV VTI ratio: 0.69  AORTA Ao Root diam: 2.80 cm Ao Asc diam:  3.50 cm MITRAL VALVE                TRICUSPID VALVE MV Area (PHT): 5.79 cm     TR Peak grad:   27.9 mmHg MV Area VTI:   2.78 cm     TR Vmax:        264.00 cm/s MV Peak grad:  9.0 mmHg MV Mean grad:  3.0 mmHg     SHUNTS MV Vmax:       1.50 m/s     Systemic VTI:  0.31 m MV Vmean:      82.4 cm/s    Systemic Diam: 2.00 cm MV Decel Time: 131 msec MV E velocity: 130.00 cm/s MV A velocity: 98.50 cm/s MV E/A ratio:  1.32 Dalton McleanMD Electronically signed by Franki Monte  Signature Date/Time: 05/15/2022/4:50:44 PM    Final    CT Angio Chest PE W and/or Wo Contrast  Result Date: 05/14/2022 CLINICAL DATA:  High suspicion of pulmonary embolism. EXAM: CT ANGIOGRAPHY CHEST WITH CONTRAST TECHNIQUE: Multidetector CT imaging of the chest was performed using the standard protocol during bolus administration of intravenous contrast. Multiplanar CT image reconstructions and MIPs were obtained to evaluate the vascular anatomy. RADIATION DOSE REDUCTION: This exam was performed according to the departmental dose-optimization program which includes automated exposure control, adjustment of the mA and/or kV according to patient size and/or use of iterative reconstruction technique. CONTRAST:  39m OMNIPAQUE IOHEXOL 350 MG/ML SOLN COMPARISON:  08/24/2018 FINDINGS: Cardiovascular: Heart is normal size. There is calcified plaque over the left main and 3 vessel coronary arteries. Thoracic aorta  is normal in caliber. There is calcified plaque throughout the thoracic aorta. Pulmonary arterial system is well opacified without evidence of emboli. Remaining vascular structures are unremarkable. Mediastinum/Nodes: Mediastinal adenopathy over the pretracheal, precarinal and subcarinal regions with the largest node measuring 1.3 cm by short axis over the subcarinal region. No significant hilar adenopathy. Remaining mediastinal structures are unremarkable. Lungs/Pleura: Lungs are adequately inflated and demonstrate patchy bilateral airspace opacification likely multifocal pneumonia. Small to moderate size bilateral pleural effusions are present. Airways are unremarkable. Upper Abdomen: Calcified plaque over the abdominal aorta. There is an aortic stent graft beginning just below the level of the renal arteries on the most inferior images and therefore not completely evaluated. No acute findings in the visualized abdomen. Musculoskeletal: No focal abnormality. Review of the MIP images confirms the above  findings. IMPRESSION: 1. No evidence of pulmonary embolism. 2. Patchy bilateral airspace opacification likely multifocal pneumonia. Small to moderate size bilateral pleural effusions. 3. Mediastinal adenopathy over the pretracheal, precarinal and subcarinal regions likely reactive. 4. Aortic atherosclerosis. Atherosclerotic coronary artery disease. Aortic Atherosclerosis (ICD10-I70.0). Electronically Signed   By: Marin Olp M.D.   On: 05/14/2022 15:16   DG Chest Port 1 View  Result Date: 05/14/2022 CLINICAL DATA:  Short of breath.  Productive cough EXAM: PORTABLE CHEST 1 VIEW COMPARISON:  11/19/2018 FINDINGS: Normal cardiac silhouette. There is fine bilateral perihilar airspace disease with a lower lobe distribution. Potential small effusions. No pneumothorax. IMPRESSION: Bilateral lower lobe predominant airspace disease suggests pulmonary edema. Multifocal pneumonia less favored. Electronically Signed   By: Suzy Bouchard M.D.   On: 05/14/2022 12:51    Microbiology Recent Results (from the past 240 hour(s))  Blood culture (routine x 2)     Status: None   Collection Time: 05/24/2022  3:10 PM   Specimen: BLOOD  Result Value Ref Range Status   Specimen Description   Final    BLOOD LEFT ANTECUBITAL Performed at [facility] [address], [place], [place] [address]    Special Requests   Final    BOTTLES DRAWN AEROBIC AND ANAEROBIC Blood Culture adequate volume Performed at [facility] [address] [place], [place] [address]    Culture   Final    NO GROWTH 5 DAYS Performed at [facility], [place] [address], [place], [place] [address]    Report Status 2022-06-25 FINAL  Final  Blood culture (routine x 2)     Status: None   Collection Time: 06/09/2022  3:53 PM   Specimen: BLOOD RIGHT HAND  Result Value Ref Range Status   Specimen Description   Final    BLOOD RIGHT HAND Performed at [facility], [place] [address], [place], [place]  [address]    Special Requests   Final    BOTTLES DRAWN AEROBIC AND ANAEROBIC Blood Culture results may not be optimal due to an inadequate volume of blood received in culture bottles Performed at [facility] [address] [place], [place] [address]    Culture   Final    NO GROWTH 5 DAYS Performed at [facility], [place] [address], [place], [place] [address]    Report Status 06-25-2022 FINAL  Final  MRSA Next Gen by PCR, Nasal     Status: None   Collection Time: 06/02/2022  6:09 PM   Specimen: Nasal Mucosa; Nasal Swab  Result Value Ref Range Status   MRSA by PCR Next Gen NOT DETECTED NOT DETECTED Final  Comment: (NOTE) The GeneXpert MRSA Assay (FDA approved for NASAL specimens only), is one component of a comprehensive MRSA colonization surveillance program. It is not intended to diagnose MRSA infection nor to guide or monitor treatment for MRSA infections. Test performance is not FDA approved in patients less than 34 years old. Performed at [facility], [place] [address], [place], [place] [address]   Resp panel by RT-PCR (RSV, Flu A&B, Covid)     Status: None   Collection Time: 06/06/2022  6:45 PM  Result Value Ref Range Status   SARS Coronavirus 2 by RT PCR NEGATIVE NEGATIVE Final    Comment: (NOTE) SARS-CoV-2 target nucleic acids are NOT DETECTED.  The SARS-CoV-2 RNA is generally detectable in upper respiratory specimens during the acute phase of infection. The lowest concentration of SARS-CoV-2 viral copies this assay can detect is 138 copies/mL. A negative result does not preclude SARS-Cov-2 infection and should not be used as the sole basis for treatment or other patient management decisions. A negative result may occur with  improper specimen collection/handling, submission of specimen other than nasopharyngeal swab, presence of viral mutation(s) within the areas targeted by this assay, and inadequate number of viral copies(<138  copies/mL). A negative result must be combined with clinical observations, patient history, and epidemiological information. The expected result is Negative.  Fact Sheet for Patients:  EntrepreneurPulse.com.au  Fact Sheet for Healthcare Providers:  IncredibleEmployment.be  This test is no t yet approved or cleared by the [place] FDA and  has been authorized for detection and/or diagnosis of SARS-CoV-2 by FDA under an Emergency Use Authorization (EUA). This EUA will remain  in effect (meaning this test can be used) for the duration of the COVID-19 declaration under Section 564(b)(1) of the Act, 21 U.S.C.section 360bbb-3(b)(1), unless the authorization is terminated  or revoked sooner.       Influenza A by PCR NEGATIVE NEGATIVE Final   Influenza B by PCR NEGATIVE NEGATIVE Final    Comment: (NOTE) The Xpert Xpress SARS-CoV-2/FLU/RSV plus assay is intended as an aid in the diagnosis of influenza from Nasopharyngeal swab specimens and should not be used as a sole basis for treatment. Nasal washings and aspirates are unacceptable for Xpert Xpress SARS-CoV-2/FLU/RSV testing.  Fact Sheet for Patients: EntrepreneurPulse.com.au  Fact Sheet for Healthcare Providers: IncredibleEmployment.be  This test is not yet approved or cleared by the [place] FDA and has been authorized for detection and/or diagnosis of SARS-CoV-2 by FDA under an Emergency Use Authorization (EUA). This EUA will remain in effect (meaning this test can be used) for the duration of the COVID-19 declaration under Section 564(b)(1) of the Act, 21 U.S.C. section 360bbb-3(b)(1), unless the authorization is terminated or revoked.     Resp Syncytial Virus by PCR NEGATIVE NEGATIVE Final    Comment: (NOTE) Fact Sheet for Patients: EntrepreneurPulse.com.au  Fact Sheet for Healthcare  Providers: IncredibleEmployment.be  This test is not yet approved or cleared by the [place] FDA and has been authorized for detection and/or diagnosis of SARS-CoV-2 by FDA under an Emergency Use Authorization (EUA). This EUA will remain in effect (meaning this test can be used) for the duration of the COVID-19 declaration under Section 564(b)(1) of the Act, 21 U.S.C. section 360bbb-3(b)(1), unless the authorization is terminated or revoked.  Performed at [facility], [place] [address] [place], [place] [address]   Respiratory (~20 pathogens) panel by PCR     Status: None   Collection Time: 05/24/22  6:53 AM  Result Value Ref Range Status   Adenovirus NOT DETECTED NOT DETECTED Final   Coronavirus 229E NOT DETECTED NOT DETECTED Final    Comment: (NOTE) The Coronavirus on the Respiratory Panel, DOES NOT test for the novel  Coronavirus (2019 nCoV)    Coronavirus HKU1 NOT DETECTED NOT DETECTED Final   Coronavirus NL63 NOT DETECTED NOT DETECTED Final   Coronavirus OC43 NOT DETECTED NOT DETECTED Final   Metapneumovirus NOT DETECTED NOT DETECTED Final   Rhinovirus / Enterovirus NOT DETECTED NOT DETECTED Final   Influenza A NOT DETECTED NOT DETECTED Final   Influenza B NOT DETECTED NOT DETECTED Final   Parainfluenza Virus 1 NOT DETECTED NOT DETECTED Final   Parainfluenza Virus 2 NOT DETECTED NOT DETECTED Final   Parainfluenza Virus 3 NOT DETECTED NOT DETECTED Final   Parainfluenza Virus 4 NOT DETECTED NOT DETECTED Final   Respiratory Syncytial Virus NOT DETECTED NOT DETECTED Final   Bordetella pertussis NOT DETECTED NOT DETECTED Final   Bordetella Parapertussis NOT DETECTED NOT DETECTED Final   Chlamydophila pneumoniae NOT DETECTED NOT DETECTED Final   Mycoplasma pneumoniae NOT DETECTED NOT DETECTED Final    Comment: Performed at [facility], [place] [address], [place], [place] [address]  Culture, Respiratory w Gram Stain     Status: None    Collection Time: 05/24/22 12:51 PM   Specimen: Tracheal Aspirate; Respiratory  Result Value Ref Range Status   Specimen Description   Final    TRACHEAL ASPIRATE Performed at [facility] [address] [place], [place] [address]    Special Requests   Final    NONE Performed at [facility], [place] [address] [place], [place] [address]    Gram Stain   Final    FEW WBC PRESENT, PREDOMINANTLY PMN NO ORGANISMS SEEN    Culture   Final    NO GROWTH 2 DAYS Performed at [facility] [address] [place], [place] [address]    Report Status 05/26/2022 FINAL  Final    Lab Basic Metabolic Panel: Recent Labs  Lab 05/15/2022 2032 05/24/22 0231 05/24/22 1301 05/24/22 1938 05/25/22 0614 05/26/22 0745 05/27/22 1039 05/27/22 1617 06-16-2022 0427 June 16, 2022 1015  NA  --  141   < >  --  138 144 140 147* 144  144 145  K  --  5.3*   < >  --  4.3 4.5 6.3* 5.7* 4.8  4.5 5.3*  CL  --  108   < >  --  107 113* 108 106 106  108 99  CO2  --  19*   < >  --  19* 19* 11* 16* 16*  16* 21*  GLUCOSE  --  151*   < >  --  181* 160* 250* 139* 107*  103* 100*  BUN  --  93*   < >  --  115* 123* 137* 124* 90*  77* 77*  CREATININE  --  3.33*   < >  --  3.34* 3.14* 5.44* 5.17* 3.52*  3.53* 3.62*  CALCIUM  --  9.0   < >  --  8.7* 8.6* 8.2* 7.3* 6.3*  6.1* 6.4*  MG 2.5* 2.5*  --  2.8* 3.0*  --   --   --  2.5*  --   PHOS 6.5* 7.1*  --  8.1* 6.7*  --   --  15.0* 10.4*  9.6*  --    < > = values in this interval not  displayed.   Liver Function Tests: Recent Labs  Lab 05/30/2022 1520 05/27/22 1617 2022/05/31 0427 05/31/2022 1015  AST 36  --   --  >10,000*  ALT 40  --   --  7,798*  ALKPHOS 59  --   --  105  BILITOT 0.8  --   --  3.7*  PROT 7.2  --   --  5.3*  ALBUMIN 3.3* 2.5* 2.5* 2.2*   No results for input(s): "LIPASE", "AMYLASE" in the last 168 hours. No results for input(s): "AMMONIA" in the last 168 hours. CBC: Recent Labs  Lab 06/04/2022 1520  05/17/2022 1831 05/25/22 0614 05/26/22 0745 05/27/22 0754 05-31-2022 0427 2022-05-31 1015  WBC 12.3*   < > 10.2 8.6 19.8* 21.6* 19.6*  NEUTROABS 9.8*  --   --   --   --   --   --   HGB 8.5*   < > 8.8* 8.1* 9.1* 8.8* 8.5*  HCT 25.8*   < > 27.2* 24.5* 29.5* 28.6* 27.1*  MCV 97.7   < > 97.5 95.3 101.0* 104.4* 102.7*  PLT 316   < > 262 241 256 131* 119*   < > = values in this interval not displayed.   Cardiac Enzymes: No results for input(s): "CKTOTAL", "CKMB", "CKMBINDEX", "TROPONINI" in the last 168 hours. Sepsis Labs: Recent Labs  Lab 05/29/2022 1520 05/21/2022 1713 06/03/2022 1831 05/24/22 0231 05/26/22 0745 05/27/22 0754 May 31, 2022 0427 31-May-2022 0903 05-31-2022 1015  PROCALCITON  --   --  0.17  --   --   --   --  1.56  --   WBC 12.3*  --  14.7*   < > 8.6 19.8* 21.6*  --  19.6*  LATICACIDVEN 1.4 1.2  --   --  1.2  --   --  >9.0*  --    < > = values in this interval not displayed.    Procedures/Operations  As per EMR   Bonna Gains [place] Zetino 2022/05/31, 6:10 PM

## 2022-06-10 NOTE — Progress Notes (Signed)
Removed IV site from left AC. Site appeared okay after applying gauze dressing, but within a few minutes was bleeding down his arm significantly. Held pressure for 5 minutes with gauze, then applied small square thrombi-pad and gauze pressure dressing. Will continue to monitor.

## 2022-06-10 NOTE — Progress Notes (Signed)
ANTICOAGULATION CONSULT NOTE  Pharmacy Consult for Heparin Indication: atrial fibrillation, NSTEMI  Allergies  Allergen Reactions   Diclofenac Diarrhea   Lipitor [Atorvastatin] Other (See Comments)    Muscle pain   Livalo [Pitavastatin] Other (See Comments)    Muscle aches   Statins     Muscle aches    Patient Measurements: Height: '5\' 10"'$  (177.8 cm) Weight: 81.1 kg (178 lb 12.7 oz) IBW/kg (Calculated) : 73 Heparin Dosing Weight: 81.1 kg  Vital Signs: Temp: 97 F (36.1 C) (12/18 2345) Temp Source: Bladder (12/18 2200) BP: 92/47 (12/18 2318) Pulse Rate: 139 (12/18 2345)  Labs: Recent Labs    05/25/22 0614 05/26/22 0745 05/27/22 0754 05/27/22 1039 05/27/22 1154 05/27/22 1617 05/27/22 2305  HGB 8.8* 8.1* 9.1*  --   --   --   --   HCT 27.2* 24.5* 29.5*  --   --   --   --   PLT 262 241 256  --   --   --   --   APTT 82* 76*  --   --  37*  --  52*  HEPARINUNFRC >1.10* >1.10*  --   --  0.37  --   --   CREATININE 3.34* 3.14*  --  5.44*  --  5.17*  --   TROPONINIHS  --   --   --  >24,000*  --   --   --      Estimated Creatinine Clearance: 12.2 mL/min (A) (by C-G formula based on SCr of 5.17 mg/dL (H)).  Assessment: AC/Heme: Eliquis PTA (LD 12/14 mid-day) for h/o afib>> 12/14 to IV heparin which was stopped 12/17 due to bloody sputum 12/14-12/16 Hgb 8.2 >7.1 transfuse 12/15>>Hgb 8.8 12/18 ACS with elevated troponins; Pharmacy re-consulted to dose IV heparin for NSTEMI    - Heparin level 0.37 - still elevated from Eliquis    - aPTT 37 - appropriately sub-therapeutic prior to restarting heparin  June 14, 2022 aPTT 52, subtherapeutic on 1000 units/hr Per RN no interruptions or bleeding  Goal of Therapy:  aPTT 66-102 seconds Monitor platelets by anticoagulation protocol: Yes Heparin level  0.3-0.7   Plan:  Heparin 1000 units IV bolus per infusion x 1 then increase heparin IV continuous infusion to 1150 units/hr 8 hr aPTT level Monitor daily aPTT & heparin level  until correlating then only use heparin levels, CBC, signs/symptoms of bleeding     Thank you for allowing pharmacy to be a part of this patient's care.  Dolly Rias RPh 06-14-2022, 12:05 AM

## 2022-06-10 NOTE — Progress Notes (Signed)
[facility] Progress Note Patient Name: Samuel [name] DOB: 04/18/1946 MRN: [id number]   Date of Service  06/06/22  HPI/Events of Note  Multiple issues: 1. Hypoglycemia - Blood glucose = 60. 2. Heart Rhythm continues to go in and out of AFIB. Currently in NSR with rate = 77. Last K+ = 5.7 and Mg++ = 3.0. Patient is on a Norepinephrine IV infusion.  eICU Interventions  Plan: D10W IV infusion at 50 mL/hour.  Amiodarone 150 mg IV over 10 minutes now.      Intervention Category Major Interventions: Other:;Arrhythmia - evaluation and management  Saben Donigan [place] Copa 06/06/22, 3:40 AM

## 2022-06-10 NOTE — Progress Notes (Signed)
RT NOTE:  Pt extubated to comfort care measures per CCMD order and family request.

## 2022-06-10 NOTE — Progress Notes (Signed)
Chaplain was paged to provide support to Samuel [name] and his family.  They had contacted his priest, Samuel [name], and were waiting for him to come before removing any of the medical interventions.  They were in shock at how quickly things changed and were feeling some moral distress about having to make a decision.  They were able to name that his body is making the decision for them and as hard as that is, they do not want him to suffer.  Chaplain gave them privacy to have family time, but remained on the unit until Samuel [name] arrived.  If further support is needed, please page chaplains.  [address], [place] Pager, [phone]

## 2022-06-10 NOTE — Progress Notes (Signed)
Verbal order for Emergency release blood by Shearon Stalls PA. Blood bank notified and unit obtained. Dual verified by this RN and Farrell Ours RN. Pre-blood, 15 minute post and end vitals recorded in flowsheets.   Verbal order to run 965m/hr by DShearon StallsPA  Blood started at 1020  Ended at 1045

## 2022-06-10 NOTE — Progress Notes (Addendum)
Patient ID: Samuel [name], male   DOB: 24-May-1946, 77 y.o.   MRN: [id number] S: Pt became progressively hypotensive this morning requiring escalation of pressors and taken off of CRRT at 10 am today.  Wife at bedside and discussed grim prognosis with she and PCCM.  He is receiving blood transfusion due to the development of blood from OGT.  He also developed Afib/flutter last night and started on amiodarone.  O:BP (!) 137/40   Pulse 74   Temp (!) 97.3 F (36.3 C)   Resp (!) 28   Ht '5\' 10"'$  (1.778 m)   Wt 80.2 kg   SpO2 98%   BMI 25.37 kg/m   Intake/Output Summary (Last 24 hours) at 2022-06-01 1047 Last data filed at June 01, 2022 1000 Gross per 24 hour  Intake 2228.31 ml  Output 2178.3 ml  Net 50.01 ml   Intake/Output: I/O last 3 completed shifts: In: 2057.5 [I.V.:1752.2; Other:100; IV Piggyback:205.3] Out: 1899.3 [Urine:42; Emesis/NG output:15]  Intake/Output this shift:  Total I/O In: 502.5 [I.V.:342.4; NG/GT:60; IV Piggyback:100.1] Out: 279 [Emesis/NG output:50] Weight change: -0.9 kg Gen: intubated and will open eyes at times CVS: RRR Resp: ventilated BS bilaterally Abd: +BS, soft Ext: no edema but now with cyanosis of left 4th toe.  Recent Labs  Lab 05/29/2022 1520 05/29/2022 1831 06/02/2022 2032 05/24/22 0231 05/24/22 1301 05/24/22 1938 05/25/22 0614 05/26/22 0745 05/27/22 1039 05/27/22 1617 2022/06/01 0427  NA 137  --   --  141 139  --  138 144 140 147* 144  144  K 4.3  --   --  5.3* 5.1  --  4.3 4.5 6.3* 5.7* 4.8  4.5  CL 106  --   --  108 109  --  107 113* 108 106 106  108  CO2 20*  --   --  19* 17*  --  19* 19* 11* 16* 16*  16*  GLUCOSE 124*  --   --  151* 139*  --  181* 160* 250* 139* 107*  103*  BUN 76*  --   --  93* 108*  --  115* 123* 137* 124* 90*  77*  CREATININE 2.45*   < >  --  3.33* 3.61*  --  3.34* 3.14* 5.44* 5.17* 3.52*  3.53*  ALBUMIN 3.3*  --   --   --   --   --   --   --   --  2.5* 2.5*  CALCIUM 9.2  --   --  9.0 8.3*  --  8.7* 8.6* 8.2*  7.3* 6.3*  6.1*  PHOS  --   --  6.5* 7.1*  --  8.1* 6.7*  --   --  15.0* 10.4*  9.6*  AST 36  --   --   --   --   --   --   --   --   --   --   ALT 40  --   --   --   --   --   --   --   --   --   --    < > = values in this interval not displayed.   Liver Function Tests: Recent Labs  Lab 05/30/2022 1520 05/27/22 1617 Jun 01, 2022 0427  AST 36  --   --   ALT 40  --   --   ALKPHOS 59  --   --   BILITOT 0.8  --   --   PROT 7.2  --   --  ALBUMIN 3.3* 2.5* 2.5*   No results for input(s): "LIPASE", "AMYLASE" in the last 168 hours. No results for input(s): "AMMONIA" in the last 168 hours. CBC: Recent Labs  Lab 05/11/2022 1520 06/07/2022 1831 05/24/22 0231 05/24/22 1938 05/25/22 0614 05/26/22 0745 05/27/22 0754 06-19-22 0427  WBC 12.3*   < > 10.8*  --  10.2 8.6 19.8* 21.6*  NEUTROABS 9.8*  --   --   --   --   --   --   --   HGB 8.5*   < > 7.1*   < > 8.8* 8.1* 9.1* 8.8*  HCT 25.8*   < > 22.5*   < > 27.2* 24.5* 29.5* 28.6*  MCV 97.7   < > 102.3*  --  97.5 95.3 101.0* 104.4*  PLT 316   < > 246  --  262 241 256 131*   < > = values in this interval not displayed.   Cardiac Enzymes: No results for input(s): "CKTOTAL", "CKMB", "CKMBINDEX", "TROPONINI" in the last 168 hours. CBG: Recent Labs  Lab 05/27/22 2308 05/27/22 2327 Jun 19, 2022 0321 06-19-2022 0340 06-19-2022 0733  GLUCAP 65* 115* 60* 110* 93    Iron Studies: No results for input(s): "IRON", "TIBC", "TRANSFERRIN", "FERRITIN" in the last 72 hours. Studies/Results: DG Abd 1 View  Result Date: 06/19/2022 CLINICAL DATA:  Tube placement EXAM: ABDOMEN - 1 VIEW COMPARISON:  06/07/2022. FINDINGS: Demonstrates a NG tube tip superimposed with the stomach the bowel gas pattern is normal. No image of the upper abdomen. IMPRESSION: NG tube tip overlies the stomach. Electronically Signed   By: Sammie Bench M.D.   On: 06/19/2022 09:48   DG CHEST PORT 1 VIEW  Result Date: 06/19/2022 CLINICAL DATA:  [id number] Respiratory failure ([place]) [id number]  EXAM: PORTABLE CHEST 1 VIEW COMPARISON:  May 27 2022 FINDINGS: The cardiomediastinal silhouette is unchanged in contour.ETT tip terminates 8.2 cm above the carina just at the level the thoracic inlet. Enteric tube tip and side port project over the proximal stomach. LEFT neck CVC tip terminates over the expected confluence of the LEFT brachiocephalic vein and SVC. No pleural effusion. No pneumothorax. Diffuse interstitial opacities, similar in comparison to prior. IMPRESSION: 1. Support apparatus as described above. ETT tip terminates approximately 8.2 cm above the carina at the level of the thoracic inlet. 2. Similar appearance of diffuse interstitial opacities. Differential considerations include pulmonary edema versus atypical infection. Electronically Signed   By: Valentino Saxon M.D.   On: June 19, 2022 07:42   ECHOCARDIOGRAM COMPLETE  Result Date: 05/27/2022    ECHOCARDIOGRAM REPORT   Patient Name:   NAHOME BUBLITZ Date of Exam: 05/27/2022 Medical Rec #:  [id number]        Height:       70.0 in Accession #:    [id number]       Weight:       178.8 lb Date of Birth:  18-Apr-1946        BSA:          1.990 m Patient Age:    76 years         BP:           115/42 mmHg Patient Gender: M                HR:           81 bpm. Exam Location:  Inpatient Procedure: 2D Echo, Cardiac Doppler and Color Doppler  STAT ECHO Reported to: Dr Marry Guan on 05/27/2022 6:11:00 PM. Indications:    CHF-acute diastolic  History:        Patient has prior history of Echocardiogram examinations. Risk                 Factors:Hypertension, Diabetes, Dyslipidemia and Former Smoker.                 AKI. ARDS. Acute respiratory failure with hypoxia.  Sonographer:    Clayton Lefort RDCS (AE) Referring Phys: [id number] [facility]  Sonographer Comments: Echo performed with patient supine and on artificial respirator. IMPRESSIONS  1. Left ventricular ejection fraction, by estimation, is 55 to 60%. The left ventricle has  normal function. The left ventricle has no regional wall motion abnormalities. There is moderate concentric left ventricular hypertrophy. Left ventricular diastolic parameters are consistent with Grade II diastolic dysfunction (pseudonormalization).  2. Right ventricular systolic function is normal. The right ventricular size is mildly enlarged. Tricuspid regurgitation signal is inadequate for assessing PA pressure.  3. The mitral valve is degenerative. Trivial mitral valve regurgitation. No evidence of mitral stenosis.  4. The aortic valve is calcified. Aortic valve regurgitation is not visualized. Aortic valve sclerosis/calcification is present, without any evidence of aortic stenosis. Comparison(s): No significant change from prior study. FINDINGS  Left Ventricle: Left ventricular ejection fraction, by estimation, is 55 to 60%. The left ventricle has normal function. The left ventricle has no regional wall motion abnormalities. The left ventricular internal cavity size was normal in size. There is  moderate concentric left ventricular hypertrophy. Left ventricular diastolic parameters are consistent with Grade II diastolic dysfunction (pseudonormalization). Right Ventricle: The right ventricular size is mildly enlarged. No increase in right ventricular wall thickness. Right ventricular systolic function is normal. Tricuspid regurgitation signal is inadequate for assessing PA pressure. Left Atrium: Left atrial size was normal in size. Right Atrium: Right atrial size was normal in size. Pericardium: There is no evidence of pericardial effusion. Mitral Valve: The mitral valve is degenerative in appearance. Trivial mitral valve regurgitation. No evidence of mitral valve stenosis. Tricuspid Valve: The tricuspid valve is grossly normal. Tricuspid valve regurgitation is mild . No evidence of tricuspid stenosis. Aortic Valve: The aortic valve is calcified. Aortic valve regurgitation is not visualized. Aortic valve  sclerosis/calcification is present, without any evidence of aortic stenosis. Aortic valve mean gradient measures 7.0 mmHg. Aortic valve peak gradient measures 14.4 mmHg. Aortic valve area, by VTI measures 1.89 cm. Pulmonic Valve: The pulmonic valve was grossly normal. Pulmonic valve regurgitation is not visualized. No evidence of pulmonic stenosis. Aorta: The aortic root is normal in size and structure. Venous: IVC assessment for right atrial pressure unable to be performed due to mechanical ventilation. IAS/Shunts: The atrial septum is grossly normal.  LEFT VENTRICLE PLAX 2D LVIDd:         3.40 cm      Diastology LVIDs:         2.70 cm      LV e' medial:    5.33 cm/s LV PW:         1.40 cm      LV E/e' medial:  18.9 LV IVS:        1.40 cm      LV e' lateral:   5.00 cm/s LVOT diam:     2.00 cm      LV E/e' lateral: 20.2 LV SV:         56 LV SV Index:  28 LVOT Area:     3.14 cm  LV Volumes (MOD) LV vol d, MOD A2C: 125.0 ml LV vol d, MOD A4C: 73.7 ml LV vol s, MOD A2C: 47.1 ml LV vol s, MOD A4C: 41.1 ml LV SV MOD A2C:     77.9 ml LV SV MOD A4C:     73.7 ml LV SV MOD BP:      50.6 ml RIGHT VENTRICLE             IVC RV Basal diam:  3.40 cm     IVC diam: 1.80 cm RV S prime:     11.00 cm/s TAPSE (M-mode): 2.0 cm LEFT ATRIUM             Index        RIGHT ATRIUM           Index LA diam:        3.30 cm 1.66 cm/m   RA Area:     18.30 cm LA Vol (A2C):   39.7 ml 19.95 ml/m  RA Volume:   60.10 ml  30.20 ml/m LA Vol (A4C):   48.4 ml 24.32 ml/m LA Biplane Vol: 45.1 ml 22.66 ml/m  AORTIC VALVE AV Area (Vmax):    1.93 cm AV Area (Vmean):   1.80 cm AV Area (VTI):     1.89 cm AV Vmax:           190.00 cm/s AV Vmean:          124.000 cm/s AV VTI:            0.298 m AV Peak Grad:      14.4 mmHg AV Mean Grad:      7.0 mmHg LVOT Vmax:         117.00 cm/s LVOT Vmean:        71.100 cm/s LVOT VTI:          0.179 m LVOT/AV VTI ratio: 0.60  AORTA Ao Root diam: 3.30 cm MITRAL VALVE MV Area (PHT): 2.82 cm     SHUNTS MV Decel Time:  269 msec     Systemic VTI:  0.18 m MV E velocity: 101.00 cm/s  Systemic Diam: 2.00 cm MV A velocity: 86.50 cm/s MV E/A ratio:  1.17 Eleonore Chiquito MD Electronically signed by Eleonore Chiquito MD Signature Date/Time: 05/27/2022/6:29:16 PM    Final    DG CHEST PORT 1 VIEW  Result Date: 05/27/2022 CLINICAL DATA:  [id number] Encounter for central line placement [id number] EXAM: PORTABLE CHEST 1 VIEW COMPARISON:  May 27, 2022 FINDINGS: The cardiomediastinal silhouette is unchanged in contour.Atherosclerotic calcifications of the aorta. ETT tip terminates 7 cm above the carina. The enteric tube courses through the chest to the abdomen beyond the field-of-view. LEFT neck CVC tip terminates over the expected region of the confluence of the LEFT brachiocephalic and SVC. No pleural effusion. No pneumothorax. Similar appearance of hazy bilateral predominately interstitial opacities. IMPRESSION: LEFT neck CVC tip terminates over the expected region of the confluence of the LEFT brachiocephalic and SVC. Electronically Signed   By: Valentino Saxon M.D.   On: 05/27/2022 14:43   DG Chest Port 1 View  Result Date: 05/27/2022 CLINICAL DATA:  Acute respiratory failure.  Respiratory distress. EXAM: PORTABLE CHEST 1 VIEW COMPARISON:  Chest x-rays dated 05/26/2022 and 05/25/2022. Chest CT dated 05/14/2022. FINDINGS: Endotracheal tube is stable in position with tip approximately 6 cm above the level of the carina. Enteric tube passes into the stomach. Diffuse bilateral airspace  opacities, predominantly interstitial, are not significantly changed compared to yesterday's most recent chest x-ray, questionably decreased compared to yesterday's morning chest x-ray. No new lung findings. No pneumothorax is seen. Heart size and mediastinal contours appear stable. IMPRESSION: 1. Diffuse bilateral airspace opacities, predominantly interstitial, compatible with bilateral multifocal pneumonia, not significantly changed. 2. No new lung  findings. 3. Support apparatus appears stable in position. Electronically Signed   By: Franki Cabot M.D.   On: 05/27/2022 08:13   Portable Chest x-ray  Result Date: 05/26/2022 CLINICAL DATA:  Endotracheal tube EXAM: PORTABLE CHEST 1 VIEW COMPARISON:  Chest x-ray 05/26/2022 FINDINGS: Endotracheal tube tip is proximally 6 cm above the carina. Enteric tube extends below the diaphragm with distal tip in the mid stomach. Cardiomediastinal silhouette is within normal limits. Diffuse bilateral airspace opacities in the mid and lower lungs have mildly decreased. No pleural effusion or pneumothorax. No acute fractures are seen. IMPRESSION: 1. Endotracheal tube tip is 6 cm above the carina. 2. Enteric tube tip in the mid stomach. 3. Diffuse bilateral airspace opacities in the mid and lower lungs have mildly decreased. Electronically Signed   By: Ronney Asters M.D.   On: 05/26/2022 19:20   DG CHEST PORT 1 VIEW  Result Date: 05/26/2022 CLINICAL DATA:  Hypoxia EXAM: PORTABLE CHEST 1 VIEW COMPARISON:  05/25/2022, chest CT 05/14/2022, radiograph 05/24/2022, 06/06/2022 FINDINGS: Cardiomegaly. Widespread heterogeneous bilateral airspace disease appears slightly worse compared to yesterday's radiograph. Bilateral effusions. No pneumothorax. IMPRESSION: Widespread heterogeneous bilateral airspace disease, slightly worse compared to yesterday's radiograph. Bilateral effusions and cardiomegaly otherwise unchanged. Electronically Signed   By: Donavan Foil M.D.   On: 05/26/2022 18:31    Chlorhexidine Gluconate Cloth  6 each Topical Daily   hydrocortisone sod succinate (SOLU-CORTEF) inj  100 mg Intravenous Q12H   mouth rinse  15 mL Mouth Rinse Q2H   [START ON 05/31/2022] pantoprazole  40 mg Intravenous Q12H   polyethylene glycol  17 g Per Tube Daily    BMET    Component Value Date/Time   NA 144 06/02/22 0427   NA 144 06/02/22 0427   K 4.5 02-Jun-2022 0427   K 4.8 06-02-2022 0427   CL 108 Jun 02, 2022 0427   CL  106 06-02-22 0427   CO2 16 (L) 06-02-2022 0427   CO2 16 (L) 06/02/22 0427   GLUCOSE 103 (H) 2022-06-02 0427   GLUCOSE 107 (H) 06-02-22 0427   BUN 77 (H) 02-Jun-2022 0427   BUN 90 (H) Jun 02, 2022 0427   CREATININE 3.53 (H) June 02, 2022 0427   CREATININE 3.52 (H) 2022/06/02 0427   CALCIUM 6.1 (LL) 06/02/2022 0427   CALCIUM 6.3 (LL) 02-Jun-2022 0427   GFRNONAA 17 (L) 02-Jun-2022 0427   GFRNONAA 17 (L) Jun 02, 2022 0427   GFRAA >60 11/20/2018 0414   CBC    Component Value Date/Time   WBC 21.6 (H) June 02, 2022 0427   RBC 2.74 (L) 2022-06-02 0427   HGB 8.8 (L) 02-Jun-2022 0427   HCT 28.6 (L) 06/02/2022 0427   PLT 131 (L) 2022/06/02 0427   MCV 104.4 (H) 2022-06-02 0427   MCH 32.1 06-02-2022 0427   MCHC 30.8 06/02/2022 0427   RDW 15.5 06-02-2022 0427   LYMPHSABS 1.0 06/01/2022 1520   MONOABS 1.3 (H) 05/19/2022 1520   EOSABS 0.0 05/24/2022 1520   BASOSABS 0.0 05/22/2022 1520    Assessment/Plan:  AKI - in setting of shock (septic or cardiogenic), non-oliguric with new onset metabolic acidosis and hyperkalemia.   UA unremarkable on 05/24/22, no blood or protein.  Doubt this is due to acute GN.  Started CRRT on 05/27/22, however taken off this morning due to worsening hypotension.  Will follow to see if his BP improves with changes made and then resume later today if able.  Poor overall prognosis. Avoid nephrotoxic medications including NSAIDs and iodinated intravenous contrast exposure unless the latter is absolutely indicated.   Preferred narcotic agents for pain control are hydromorphone, fentanyl, and methadone. Morphine should not be used.  Avoid Baclofen and avoid oral sodium phosphate and magnesium citrate based laxatives / bowel preps.  Continue strict Input and Output monitoring. Will monitor the patient closely with you and intervene or adjust therapy as indicated by changes in clinical status/labs  Hyperkalemia - due to #1, improved with CRRT, however will likely start to increase  again now that he was taken off due to hypotension.  Continue to follow labs. Metabolic acidosis - was on isotonic bicarb with replacement fluid with CRRT, however now would resume bicarb drip and follow.  Hypocalcemia - ok to replace despite high CaxPhos product as calcium will continue to fall with bicarb bolus.   Vent dependent respiratory failure - combination of ARDS with flash pulmonary edema.  Plan per PCCM. Shock - likely due to sepsis vs cardiogenic vs sedation.  Currently on pressors and Cardiology following. Anemia of critical illness - transfuse for Hgb <7 Abnormal ECG - Cardiology following. Disposition - poor overall prognosis and recommend palliative care to help set goals/limits of care.  Donetta Potts, MD [facility]

## 2022-06-10 NOTE — Progress Notes (Signed)
OT Cancellation Note  Patient Details Name: Samuel [name] MRN: [id number] DOB: [birth year]   Cancelled Treatment:    Reason Eval/Treat Not Completed: Medical issues which prohibited therapy Patient is currently on CRRT with new A fib onset in PM hours and Heparin initiation. OT to continue to follow and check back on 12/20 for medical stability.  Rennie Plowman, [facility] Office# [phone]  2022/05/30, 10:21 AM

## 2022-06-10 NOTE — Progress Notes (Addendum)
Unable to get readings from indwelling foley temperature monitor since 0500. Inserted esophageal probe to monitor temperatures. Esophageal temperature reading 34.1 C / 93.4 F, placed bair hugger back on. Will continue to monitor.

## 2022-06-10 NOTE — Progress Notes (Signed)
  Amiodarone Drug - Drug Interaction Consult Note  Recommendations:  No current interactions with reviewed active medication list.  Prior to admission meds not currently on with major interaction: apixaban (see comment below), clopidogrel (coadministration should be avoided due to increased exposure to amiodarone)  Amiodarone is metabolized by the cytochrome P450 system and therefore has the potential to cause many drug interactions. Amiodarone has an average plasma half-life of 50 days (range 20 to 100 days).   There is potential for drug interactions to occur several weeks or months after stopping treatment and the onset of drug interactions may be slow after initiating amiodarone.   '[]'$  Statins: Increased risk of myopathy. Simvastatin- restrict dose to '20mg'$  daily. Other statins: counsel patients to report any muscle pain or weakness immediately.  '[x]'$  Anticoagulants: Amiodarone can increase anticoagulant effect. Consider warfarin dose reduction. Patients should be monitored closely and the dose of anticoagulant altered accordingly, remembering that amiodarone levels take several weeks to stabilize.    If prior apixaban restarted,  risk of major bleeding is increased.  '[]'$  Antiepileptics: Amiodarone can increase plasma concentration of phenytoin, the dose should be reduced. Note that small changes in phenytoin dose can result in large changes in levels. Monitor patient and counsel on signs of toxicity.  '[]'$  Beta blockers: increased risk of bradycardia, AV block and myocardial depression. Sotalol - avoid concomitant use.  If prior to admission bisoprolol restarted,   '[]'$   Calcium channel blockers (diltiazem and verapamil): increased risk of bradycardia, AV block and myocardial depression.  '[]'$   Cyclosporine: Amiodarone increases levels of cyclosporine. Reduced dose of cyclosporine is recommended.  '[]'$  Digoxin dose should be halved when amiodarone is started.  '[]'$  Diuretics: increased risk of  cardiotoxicity if hypokalemia occurs.  '[]'$  Oral hypoglycemic agents (glyburide, glipizide, glimepiride): increased risk of hypoglycemia. Patient's glucose levels should be monitored closely when initiating amiodarone therapy.   '[]'$  Drugs that prolong the QT interval:  Torsades de pointes risk may be increased with concurrent use - avoid if possible.  Monitor QTc, also keep magnesium/potassium WNL if concurrent therapy can't be avoided.  Antibiotics: e.g. fluoroquinolones, erythromycin.  Antiarrhythmics: e.g. quinidine, procainamide, disopyramide, sotalol.  Antipsychotics: e.g. phenothiazines, haloperidol.   Lithium, tricyclic antidepressants, and methadone.   Thank you for allowing pharmacy to be a part of this patient's care.  Royetta Asal, PharmD, BCPS Clinical Pharmacist [place] Please utilize Amion for appropriate phone number to reach the unit pharmacist ([place]) 06-18-2022 10:21 AM

## 2022-06-10 NOTE — Plan of Care (Signed)
Patient current intubated for second time this admission and has Continuous Renal Replacement Therapy (CRRT) in process. This evening patient's heart rate (HR) went from irregular with rate between 70-90's to 130-140's with lower blood pressures. Called E-link RN and provider. EKG and ABG obtained. A-flutter and a-fibrillation seen on EKG. ABG showed a pH of 7.19 which was improved from last results, but still reported as critical. Sodium Bicarb IV push given as prescribed by provider. HR continues to fluctuate up to near 150 and down to 70's; provider Dr. Oletta Darter states that unless it sustains, will hold of on rate control meds to prevent sudden drops below 70's. Plan to repeat ABG will 0500 labs.  Additionally, patient awoke for a short time (less than one minute) where his eyes were open, he nodded yes/no appropriately to questions asked, and followed commands to squeeze this nurse's hand with his right hand. Attempted to elicit same response with his left hand, but he stopped responding and following commands. No continuous sedation on board at this time. Patient did not appear in any respiratory distress or pain and denied both in the brief wake period. Continuous monitoring and 1:1 care all shift.   Problem: Education: Goal: Knowledge of General Education information will improve Description: Including pain rating scale, medication(s)/side effects and non-pharmacologic comfort measures Outcome: Progressing   Problem: Health Behavior/Discharge Planning: Goal: Ability to manage health-related needs will improve Outcome: Progressing   Problem: Clinical Measurements: Goal: Ability to maintain clinical measurements within normal limits will improve Outcome: Progressing Goal: Will remain free from infection Outcome: Progressing Goal: Diagnostic test results will improve Outcome: Progressing Goal: Respiratory complications will improve Outcome: Progressing Goal: Cardiovascular complication will  be avoided Outcome: Progressing   Problem: Activity: Goal: Risk for activity intolerance will decrease Outcome: Progressing   Problem: Nutrition: Goal: Adequate nutrition will be maintained Outcome: Progressing   Problem: Coping: Goal: Level of anxiety will decrease Outcome: Progressing   Problem: Elimination: Goal: Will not experience complications related to bowel motility Outcome: Progressing Goal: Will not experience complications related to urinary retention Outcome: Progressing   Problem: Pain Managment: Goal: General experience of comfort will improve Outcome: Progressing   Problem: Safety: Goal: Ability to remain free from injury will improve Outcome: Progressing   Problem: Skin Integrity: Goal: Risk for impaired skin integrity will decrease Outcome: Progressing   Problem: Activity: Goal: Ability to tolerate increased activity will improve Outcome: Progressing   Problem: Respiratory: Goal: Ability to maintain a clear airway and adequate ventilation will improve Outcome: Progressing   Problem: Role Relationship: Goal: Method of communication will improve Outcome: Progressing   Problem: Safety: Goal: Non-violent Restraint(s) Outcome: Progressing   Problem: Fluid Volume: Goal: Hemodynamic stability will improve Outcome: Progressing   Problem: Clinical Measurements: Goal: Diagnostic test results will improve Outcome: Progressing Goal: Signs and symptoms of infection will decrease Outcome: Progressing   Problem: Respiratory: Goal: Ability to maintain adequate ventilation will improve Outcome: Progressing

## 2022-06-10 NOTE — Progress Notes (Signed)
Brief Nutrition Note  Received consult to start enteral nutrition. Patient being followed by RD, last assessment note yesterday. Patient's OG tube placement has not been verified since re-intubation so reached out to CCM PA-C, Xray ordered.  Patient currently on D10W at 67m/hr which provides 408 calories over 24 hours.  Patient is noted to now be on CRRT.   Reached out to RN to discuss plan to start but RN reports patient's Levophed is now running at a very high rate.  Reached back out to PA-C and plan to hold off on TF today and attempt to restart tomorrow.    Once able to restart, would recommend below: Tube feeding via OG (tip needs xray verification of placement prior to starting tube feeds):  Pivot 1.5 at 55 ml/h (1320 ml per day) *Start at 260mhr and advance as tolerated by 1024m8H go goal  Provides 1980 kcal, 124 gm protein, 990 ml free water daily   Samuel [name], LDN For contact information, refer to [facility]

## 2022-06-10 NOTE — Progress Notes (Signed)
Time of death 1250. Confirmed by Farrell Ours RN & Ashely Quentin Cornwall RN. Family at bedside, MD aware.

## 2022-06-10 NOTE — Accreditation Note (Signed)
Restraint death two point soft wrist within 24 hours of discontinuation logged on 06/05/2022 at 1606 by Dewey Viens RNBSN

## 2022-06-10 NOTE — Progress Notes (Signed)
PCCM Interval Progress Note  Worsening shock despite increased Levophed dose up to 40, then developed recurrent A.fib with RVR with further drop in BP. CVP 8. Unable to draw Co-ox.  Phenylephrine added at 400. 500cc LR bolus given.  Discussed with cards, agreed slow bolus Amiodarone then regular drip.  While starting amio, BP dropped further despite Levo up to 50 along with Vaso at steady 0.03. CRRT stopped and blood returned. 2amps HCO3 given. Bright red blood then noted from OGT, new from when I saw pt this AM. He grimaces to palpation of abdomen. Lactate resulted > 9, PCT 1.56  Heparin stopped. 1u emergent PRBC ordered. ABG, CBC, CMP ordered. Protonix bolus and gtt ordered.   Nephrology arrived. Updated them on events above. Agreed unable to safely tolerate CRRT. Added bicarb gtt back along with 2g Ca gluconate.   If stabilizes with above measures, will consider imaging of abdomen/pelvis as well as consult GI for consideration EGD to evaluate for source of GIB.   During all of the above, wife was at bedside. I started to update her as these events started and she was present for all of interventions above. Dr. Silas Flood and Dr. Marval Regal also updated her. She states pt and family not ready to give up hope. Requesting continue aggressive measures and in the event of arrest, attempt CPR/ACLS. She is aware of how critically ill Samuel [name] is and that he has a grim prognosis.   Additional CC time: 60 min.   Montey Hora, [facility] For pager details, please see AMION or use Epic chat  After 1900, please call [facility] for cross coverage needs 06-19-22, 10:52 AM

## 2022-06-10 NOTE — Progress Notes (Signed)
[facility] Progress Note Patient Name: Samuel [name] DOB: 06/10/1946 MRN: [id number]   Date of Service  20-Jun-2022  HPI/Events of Note  Hypocalcemia - Ca++ = 6.1 which corrects to 7.30 (Low) given albumin - 2.5. However, the Phosphorus level = 9.6 and the Ca++ X Phosphorus product = 70.08.   eICU Interventions  Will not replace Ca++ at this time d/t high Ca++ X Phosphorus product. Defer Ca++ management in this patient to Nephrology.      Intervention Category Major Interventions: Electrolyte abnormality - evaluation and management  Eric Nees [place] 06-20-22, 5:23 AM

## 2022-06-10 NOTE — Progress Notes (Addendum)
Rounding Note    Patient Name: Samuel [name] Date of Encounter: Jun 21, 2022  [place] Cardiologist: Fransico Him, MD  Pt followed by [facility] as well Subjective   Now with CRRT with AKI, metabolic acidosis and hyperkalemia  New a fib/flutter last pm  PH 7.19 given bicarb  hypoglycemia  Inpatient Medications    Scheduled Meds:  aspirin  325 mg Per Tube Daily   Chlorhexidine Gluconate Cloth  6 each Topical Daily   clopidogrel  75 mg Per NG tube Daily   famotidine  20 mg Per Tube QHS   methylPREDNISolone (SOLU-MEDROL) injection  40 mg Intravenous Daily   mouth rinse  15 mL Mouth Rinse Q2H   pantoprazole (PROTONIX) IV  40 mg Intravenous Daily   polyethylene glycol  17 g Per Tube Daily   Continuous Infusions:  sodium chloride Stopped (05/27/22 2350)   sodium chloride Stopped (05/27/22 0906)   ceFEPime (MAXIPIME) IV Stopped (05/27/22 2219)   dextrose 50 mL/hr at 21-Jun-2022 0700   heparin 1,150 Units/hr (2022/06/21 0700)   norepinephrine (LEVOPHED) Adult infusion 32 mcg/min (06-21-22 0714)   prismasol BGK 2/2.5 dialysis solution 1,500 mL/hr at Jun 21, 2022 0507   prismasol BGK 2/2.5 replacement solution 400 mL/hr at 06-21-22 0431   propofol (DIPRIVAN) infusion Stopped (05/27/22 0900)   sodium bicarbonate 150 mEq in sterile water 1,150 mL infusion 120 mL/hr at 05/27/22 2341   vasopressin 0.03 Units/min (June 21, 2022 0700)   PRN Meds: sodium chloride, acetaminophen, docusate sodium, fentaNYL (SUBLIMAZE) injection, fentaNYL (SUBLIMAZE) injection, heparin, heparin, ondansetron (ZOFRAN) IV, mouth rinse, polyethylene glycol   Vital Signs    Vitals:   06/21/2022 0645 06/21/22 0700 06/21/2022 0715 Jun 21, 2022 0736  BP:  (!) 135/38    Pulse: 75 75 74   Resp: (!) 29 (!) 33 (!) 32   Temp: (!) 97.2 F (36.2 C) (!) 97.2 F (36.2 C) (!) 97.2 F (36.2 C)   TempSrc:      SpO2: 98% 98% 98% 98%  Weight:      Height:        Intake/Output Summary (Last 24 hours) at 2022/06/21 0800 Last  data filed at 2022-06-21 0700 Gross per 24 hour  Intake 1846.35 ml  Output 1899.3 ml  Net -52.95 ml      06/21/2022    4:12 AM 05/27/2022    5:00 AM 05/25/2022    5:00 AM  Last 3 Weights  Weight (lbs) 176 lb 12.9 oz 178 lb 12.7 oz 181 lb 10.5 oz  Weight (kg) 80.2 kg 81.1 kg 82.4 kg      Telemetry    SR has had atrial fib RVR - Personally Reviewed  ECG    A fib with RVR and ST depression inf lateral leads - Personally Reviewed  Physical Exam  Sedated on vent GEN: No acute distress.   Neck: No JVD Cardiac: RRR, no murmurs, rubs, or gallops.  Respiratory: Clear to auscultation bilaterally. Anteriorly  GI: Soft, nontender, non-distended  MS: No edema; No deformity. Neuro:  Nonfocal  Psych: Normal affect   Labs    High Sensitivity Troponin:   Recent Labs  Lab 05/14/22 2115 05/15/22 0722 05/12/2022 1520 05/13/2022 1713 05/27/22 1039  TROPONINIHS 386* 348* 66* 84* >24,000*     Chemistry Recent Labs  Lab 06/05/2022 1520 06/06/2022 1831 05/24/22 1938 05/25/22 0614 05/26/22 0745 05/27/22 1039 05/27/22 1617 06-21-2022 0427  NA 137   < >  --  138   < > 140 147* 144  144  K  4.3   < >  --  4.3   < > 6.3* 5.7* 4.8  4.5  CL 106   < >  --  107   < > 108 106 106  108  CO2 20*   < >  --  19*   < > 11* 16* 16*  16*  GLUCOSE 124*   < >  --  181*   < > 250* 139* 107*  103*  BUN 76*   < >  --  115*   < > 137* 124* 90*  77*  CREATININE 2.45*   < >  --  3.34*   < > 5.44* 5.17* 3.52*  3.53*  CALCIUM 9.2   < >  --  8.7*   < > 8.2* 7.3* 6.3*  6.1*  MG  --    < > 2.8* 3.0*  --   --   --  2.5*  PROT 7.2  --   --   --   --   --   --   --   ALBUMIN 3.3*  --   --   --   --   --  2.5* 2.5*  AST 36  --   --   --   --   --   --   --   ALT 40  --   --   --   --   --   --   --   ALKPHOS 59  --   --   --   --   --   --   --   BILITOT 0.8  --   --   --   --   --   --   --   GFRNONAA 26*   < >  --  18*   < > 10* 11* 17*  17*  ANIONGAP 11   < >  --  12   < > 21* 25* 22*  20*   < > =  values in this interval not displayed.    Lipids  Recent Labs  Lab 05/27/22 0754  TRIG 131    Hematology Recent Labs  Lab 05/26/22 0745 05/27/22 0754 05-31-2022 0427  WBC 8.6 19.8* 21.6*  RBC 2.57* 2.92* 2.74*  HGB 8.1* 9.1* 8.8*  HCT 24.5* 29.5* 28.6*  MCV 95.3 101.0* 104.4*  MCH 31.5 31.2 32.1  MCHC 33.1 30.8 30.8  RDW 14.9 15.1 15.5  PLT 241 256 131*   Thyroid No results for input(s): "TSH", "FREET4" in the last 168 hours.  BNP Recent Labs  Lab 05/20/2022 1815 05/26/22 0745 2022-05-31 0426  BNP 1,240.2* 2,110.0* 1,457.4*    DDimer No results for input(s): "DDIMER" in the last 168 hours.   Radiology    DG CHEST PORT 1 VIEW  Result Date: 05-31-2022 CLINICAL DATA:  [id number] Respiratory failure ([place]) [id number] EXAM: PORTABLE CHEST 1 VIEW COMPARISON:  May 27 2022 FINDINGS: The cardiomediastinal silhouette is unchanged in contour.ETT tip terminates 8.2 cm above the carina just at the level the thoracic inlet. Enteric tube tip and side port project over the proximal stomach. LEFT neck CVC tip terminates over the expected confluence of the LEFT brachiocephalic vein and SVC. No pleural effusion. No pneumothorax. Diffuse interstitial opacities, similar in comparison to prior. IMPRESSION: 1. Support apparatus as described above. ETT tip terminates approximately 8.2 cm above the carina at the level of the thoracic inlet. 2. Similar appearance of diffuse interstitial opacities. Differential considerations include pulmonary edema versus  atypical infection. Electronically Signed   By: Valentino Saxon M.D.   On: 2022/06/20 07:42   ECHOCARDIOGRAM COMPLETE  Result Date: 05/27/2022    ECHOCARDIOGRAM REPORT   Patient Name:   Samuel [name] Date of Exam: 05/27/2022 Medical Rec #:  [id number]        Height:       70.0 in Accession #:    [id number]       Weight:       178.8 lb Date of Birth:  1945-09-14        BSA:          1.990 m Patient Age:    77 years         BP:           115/42 mmHg  Patient Gender: M                HR:           81 bpm. Exam Location:  Inpatient Procedure: 2D Echo, Cardiac Doppler and Color Doppler                      STAT ECHO Reported to: Dr Marry Guan on 05/27/2022 6:11:00 PM. Indications:    CHF-acute diastolic  History:        Patient has prior history of Echocardiogram examinations. Risk                 Factors:Hypertension, Diabetes, Dyslipidemia and Former Smoker.                 AKI. ARDS. Acute respiratory failure with hypoxia.  Sonographer:    Clayton Lefort RDCS (AE) Referring Phys: [id number] [facility] P [facility]  Sonographer Comments: Echo performed with patient supine and on artificial respirator. IMPRESSIONS  1. Left ventricular ejection fraction, by estimation, is 55 to 60%. The left ventricle has normal function. The left ventricle has no regional wall motion abnormalities. There is moderate concentric left ventricular hypertrophy. Left ventricular diastolic parameters are consistent with Grade II diastolic dysfunction (pseudonormalization).  2. Right ventricular systolic function is normal. The right ventricular size is mildly enlarged. Tricuspid regurgitation signal is inadequate for assessing PA pressure.  3. The mitral valve is degenerative. Trivial mitral valve regurgitation. No evidence of mitral stenosis.  4. The aortic valve is calcified. Aortic valve regurgitation is not visualized. Aortic valve sclerosis/calcification is present, without any evidence of aortic stenosis. Comparison(s): No significant change from prior study. FINDINGS  Left Ventricle: Left ventricular ejection fraction, by estimation, is 55 to 60%. The left ventricle has normal function. The left ventricle has no regional wall motion abnormalities. The left ventricular internal cavity size was normal in size. There is  moderate concentric left ventricular hypertrophy. Left ventricular diastolic parameters are consistent with Grade II diastolic dysfunction (pseudonormalization). Right Ventricle: The  right ventricular size is mildly enlarged. No increase in right ventricular wall thickness. Right ventricular systolic function is normal. Tricuspid regurgitation signal is inadequate for assessing PA pressure. Left Atrium: Left atrial size was normal in size. Right Atrium: Right atrial size was normal in size. Pericardium: There is no evidence of pericardial effusion. Mitral Valve: The mitral valve is degenerative in appearance. Trivial mitral valve regurgitation. No evidence of mitral valve stenosis. Tricuspid Valve: The tricuspid valve is grossly normal. Tricuspid valve regurgitation is mild . No evidence of tricuspid stenosis. Aortic Valve: The aortic valve is calcified. Aortic valve regurgitation is not visualized. Aortic valve sclerosis/calcification is present, without any  evidence of aortic stenosis. Aortic valve mean gradient measures 7.0 mmHg. Aortic valve peak gradient measures 14.4 mmHg. Aortic valve area, by VTI measures 1.89 cm. Pulmonic Valve: The pulmonic valve was grossly normal. Pulmonic valve regurgitation is not visualized. No evidence of pulmonic stenosis. Aorta: The aortic root is normal in size and structure. Venous: IVC assessment for right atrial pressure unable to be performed due to mechanical ventilation. IAS/Shunts: The atrial septum is grossly normal.  LEFT VENTRICLE PLAX 2D LVIDd:         3.40 cm      Diastology LVIDs:         2.70 cm      LV e' medial:    5.33 cm/s LV PW:         1.40 cm      LV E/e' medial:  18.9 LV IVS:        1.40 cm      LV e' lateral:   5.00 cm/s LVOT diam:     2.00 cm      LV E/e' lateral: 20.2 LV SV:         56 LV SV Index:   28 LVOT Area:     3.14 cm  LV Volumes (MOD) LV vol d, MOD A2C: 125.0 ml LV vol d, MOD A4C: 73.7 ml LV vol s, MOD A2C: 47.1 ml LV vol s, MOD A4C: 41.1 ml LV SV MOD A2C:     77.9 ml LV SV MOD A4C:     73.7 ml LV SV MOD BP:      50.6 ml RIGHT VENTRICLE             IVC RV Basal diam:  3.40 cm     IVC diam: 1.80 cm RV S prime:     11.00 cm/s  TAPSE (M-mode): 2.0 cm LEFT ATRIUM             Index        RIGHT ATRIUM           Index LA diam:        3.30 cm 1.66 cm/m   RA Area:     18.30 cm LA Vol (A2C):   39.7 ml 19.95 ml/m  RA Volume:   60.10 ml  30.20 ml/m LA Vol (A4C):   48.4 ml 24.32 ml/m LA Biplane Vol: 45.1 ml 22.66 ml/m  AORTIC VALVE AV Area (Vmax):    1.93 cm AV Area (Vmean):   1.80 cm AV Area (VTI):     1.89 cm AV Vmax:           190.00 cm/s AV Vmean:          124.000 cm/s AV VTI:            0.298 m AV Peak Grad:      14.4 mmHg AV Mean Grad:      7.0 mmHg LVOT Vmax:         117.00 cm/s LVOT Vmean:        71.100 cm/s LVOT VTI:          0.179 m LVOT/AV VTI ratio: 0.60  AORTA Ao Root diam: 3.30 cm MITRAL VALVE MV Area (PHT): 2.82 cm     SHUNTS MV Decel Time: 269 msec     Systemic VTI:  0.18 m MV E velocity: 101.00 cm/s  Systemic Diam: 2.00 cm MV A velocity: 86.50 cm/s MV E/A ratio:  1.17 Eleonore Chiquito MD Electronically signed by Eleonore Chiquito MD  Signature Date/Time: 05/27/2022/6:29:16 PM    Final    DG CHEST PORT 1 VIEW  Result Date: 05/27/2022 CLINICAL DATA:  [id number] Encounter for central line placement [id number] EXAM: PORTABLE CHEST 1 VIEW COMPARISON:  May 27, 2022 FINDINGS: The cardiomediastinal silhouette is unchanged in contour.Atherosclerotic calcifications of the aorta. ETT tip terminates 7 cm above the carina. The enteric tube courses through the chest to the abdomen beyond the field-of-view. LEFT neck CVC tip terminates over the expected region of the confluence of the LEFT brachiocephalic and SVC. No pleural effusion. No pneumothorax. Similar appearance of hazy bilateral predominately interstitial opacities. IMPRESSION: LEFT neck CVC tip terminates over the expected region of the confluence of the LEFT brachiocephalic and SVC. Electronically Signed   By: Valentino Saxon M.D.   On: 05/27/2022 14:43   DG Chest Port 1 View  Result Date: 05/27/2022 CLINICAL DATA:  Acute respiratory failure.  Respiratory distress. EXAM:  PORTABLE CHEST 1 VIEW COMPARISON:  Chest x-rays dated 05/26/2022 and 05/25/2022. Chest CT dated 05/14/2022. FINDINGS: Endotracheal tube is stable in position with tip approximately 6 cm above the level of the carina. Enteric tube passes into the stomach. Diffuse bilateral airspace opacities, predominantly interstitial, are not significantly changed compared to yesterday's most recent chest x-ray, questionably decreased compared to yesterday's morning chest x-ray. No new lung findings. No pneumothorax is seen. Heart size and mediastinal contours appear stable. IMPRESSION: 1. Diffuse bilateral airspace opacities, predominantly interstitial, compatible with bilateral multifocal pneumonia, not significantly changed. 2. No new lung findings. 3. Support apparatus appears stable in position. Electronically Signed   By: Franki Cabot M.D.   On: 05/27/2022 08:13   Portable Chest x-ray  Result Date: 05/26/2022 CLINICAL DATA:  Endotracheal tube EXAM: PORTABLE CHEST 1 VIEW COMPARISON:  Chest x-ray 05/26/2022 FINDINGS: Endotracheal tube tip is proximally 6 cm above the carina. Enteric tube extends below the diaphragm with distal tip in the mid stomach. Cardiomediastinal silhouette is within normal limits. Diffuse bilateral airspace opacities in the mid and lower lungs have mildly decreased. No pleural effusion or pneumothorax. No acute fractures are seen. IMPRESSION: 1. Endotracheal tube tip is 6 cm above the carina. 2. Enteric tube tip in the mid stomach. 3. Diffuse bilateral airspace opacities in the mid and lower lungs have mildly decreased. Electronically Signed   By: Ronney Asters M.D.   On: 05/26/2022 19:20   DG CHEST PORT 1 VIEW  Result Date: 05/26/2022 CLINICAL DATA:  Hypoxia EXAM: PORTABLE CHEST 1 VIEW COMPARISON:  05/25/2022, chest CT 05/14/2022, radiograph 05/24/2022, 06/04/2022 FINDINGS: Cardiomegaly. Widespread heterogeneous bilateral airspace disease appears slightly worse compared to yesterday's  radiograph. Bilateral effusions. No pneumothorax. IMPRESSION: Widespread heterogeneous bilateral airspace disease, slightly worse compared to yesterday's radiograph. Bilateral effusions and cardiomegaly otherwise unchanged. Electronically Signed   By: Donavan Foil M.D.   On: 05/26/2022 18:31    Cardiac Studies   TTE 05/27/22  IMPRESSIONS     1. Left ventricular ejection fraction, by estimation, is 55 to 60%. The  left ventricle has normal function. The left ventricle has no regional  wall motion abnormalities. There is moderate concentric left ventricular  hypertrophy. Left ventricular  diastolic parameters are consistent with Grade II diastolic dysfunction  (pseudonormalization).   2. Right ventricular systolic function is normal. The right ventricular  size is mildly enlarged. Tricuspid regurgitation signal is inadequate for  assessing PA pressure.   3. The mitral valve is degenerative. Trivial mitral valve regurgitation.  No evidence of mitral stenosis.   4. The aortic  valve is calcified. Aortic valve regurgitation is not  visualized. Aortic valve sclerosis/calcification is present, without any  evidence of aortic stenosis.   Comparison(s): No significant change from prior study.   FINDINGS   Left Ventricle: Left ventricular ejection fraction, by estimation, is 55  to 60%. The left ventricle has normal function. The left ventricle has no  regional wall motion abnormalities. The left ventricular internal cavity  size was normal in size. There is   moderate concentric left ventricular hypertrophy. Left ventricular  diastolic parameters are consistent with Grade II diastolic dysfunction  (pseudonormalization).   Right Ventricle: The right ventricular size is mildly enlarged. No  increase in right ventricular wall thickness. Right ventricular systolic  function is normal. Tricuspid regurgitation signal is inadequate for  assessing PA pressure.   Left Atrium: Left atrial size  was normal in size.   Right Atrium: Right atrial size was normal in size.   Pericardium: There is no evidence of pericardial effusion.   Mitral Valve: The mitral valve is degenerative in appearance. Trivial  mitral valve regurgitation. No evidence of mitral valve stenosis.   Tricuspid Valve: The tricuspid valve is grossly normal. Tricuspid valve  regurgitation is mild . No evidence of tricuspid stenosis.   Aortic Valve: The aortic valve is calcified. Aortic valve regurgitation is  not visualized. Aortic valve sclerosis/calcification is present, without  any evidence of aortic stenosis. Aortic valve mean gradient measures 7.0  mmHg. Aortic valve peak gradient  measures 14.4 mmHg. Aortic valve area, by VTI measures 1.89 cm.   Pulmonic Valve: The pulmonic valve was grossly normal. Pulmonic valve  regurgitation is not visualized. No evidence of pulmonic stenosis.   Aorta: The aortic root is normal in size and structure.   Venous: IVC assessment for right atrial pressure unable to be performed  due to mechanical ventilation.   IAS/Shunts: The atrial septum is grossly normal.  Renal artery U/S  05/20/22 Renal:    Right: 1-59% stenosis of the right renal artery. Abnormal right         Resistive Index. Normal size right kidney. RRV flow present.  Left:  1-59% stenosis of the left renal artery. Normal left         Resistive Index. Normal size of left kidney. LRV flow         present.  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         Echo 05/15/22 IMPRESSIONS     1. Left ventricular ejection fraction, by estimation, is 60 to 65%. The  left ventricle has normal function. The left ventricle has no regional  wall motion abnormalities. There is moderate concentric left ventricular  hypertrophy. Left ventricular  diastolic parameters are consistent with Grade II diastolic dysfunction  (pseudonormalization).   2. Right ventricular systolic  function is normal. The right ventricular  size is normal. There is normal pulmonary artery systolic pressure. The  estimated right ventricular systolic pressure is 03.5 mmHg.   3. Left atrial size was mildly dilated.   4. Right atrial size was mildly dilated.   5. The mitral valve is normal in structure. No evidence of mitral valve  regurgitation. No evidence of mitral stenosis.   6. The aortic valve is tricuspid. There is moderate calcification of the  aortic valve. Aortic valve regurgitation is not visualized. Mild aortic  valve stenosis. Aortic valve mean gradient measures 12.0 mmHg.   7. The inferior vena cava is normal in size  with greater than 50%  respiratory variability, suggesting right atrial pressure of 3 mmHg.   FINDINGS   Left Ventricle: Left ventricular ejection fraction, by estimation, is 60  to 65%. The left ventricle has normal function. The left ventricle has no  regional wall motion abnormalities. The left ventricular internal cavity  size was normal in size. There is   moderate concentric left ventricular hypertrophy. Left ventricular  diastolic parameters are consistent with Grade II diastolic dysfunction  (pseudonormalization).   Right Ventricle: The right ventricular size is normal. No increase in  right ventricular wall thickness. Right ventricular systolic function is  normal. There is normal pulmonary artery systolic pressure. The tricuspid  regurgitant velocity is 2.64 m/s, and   with an assumed right atrial pressure of 3 mmHg, the estimated right  ventricular systolic pressure is 15.1 mmHg.   Left Atrium: Left atrial size was mildly dilated.   Right Atrium: Right atrial size was mildly dilated.   Pericardium: There is no evidence of pericardial effusion.   Mitral Valve: The mitral valve is normal in structure. Mild mitral annular  calcification. No evidence of mitral valve regurgitation. No evidence of  mitral valve stenosis. MV peak gradient, 9.0  mmHg. The mean mitral valve  gradient is 3.0 mmHg.   Tricuspid Valve: The tricuspid valve is normal in structure. Tricuspid  valve regurgitation is trivial.   Aortic Valve: The aortic valve is tricuspid. There is moderate  calcification of the aortic valve. Aortic valve regurgitation is not  visualized. Mild aortic stenosis is present. Aortic valve mean gradient  measures 12.0 mmHg. Aortic valve peak gradient  measures 20.3 mmHg. Aortic valve area, by VTI measures 2.16 cm.   Pulmonic Valve: The pulmonic valve was normal in structure. Pulmonic valve  regurgitation is not visualized.   Aorta: The aortic root is normal in size and structure.   Venous: The inferior vena cava is normal in size with greater than 50%  respiratory variability, suggesting right atrial pressure of 3 mmHg.   IAS/Shunts: No atrial level shunt detected by color flow Doppler.     Patient Profile     77 y.o. male  hx of  PAD, LSFA stent 2012, RCEA 2006, HTN, HLD, mesenteric ischemia with stent to SMA and endovascular abd. aneurysm repair in 2020, hospitalized 05/14/22 to 05/20/22 with PNA, bipap and high flow 02, brief atrial fib but SR at discharge.  Neg nug with [facility] 12/2021.  Now with acute respiratory failure extubated then re-intubated with flash pulmonary edema.  Hs troponin >24,000.   Assessment & Plan    Acute respiratory failure/ARDS/Flash Pulmonary edema/possible HCAP-RVP neg, respiratory culture neg.  --Vent dependent -- Followed by CCM --BNP 1457 --PCXR Diffuse interstitial opacities, similar in comparison to prior.  ACS with hs troponin >24,000 --neg nuc in July.  Echo now with EF 55-60% and no RWMA, RV normal though mild dilatation.  Known PAD. Cardiac ischemia could be cause of flash pulmonary edema, now with AKI though with CRRT may be able to proceed in future.  On plavix and heparin.  And ASA.   AKI with metabolic acidosis and hyperkalemia nephrology has seen and undergoing CRRT.  --Cr 3.53  today Mg+ 2.5 K+ 4.5   PAF - brief episode on last admit was placed on eliquis and bisoprolol.  Now in and out of PAF with RVR was given  amiodarone bolus,  use prn currently in SR   Chronic diastolic HF now with CRRT  HLD with intolerance to statins  last LDL 05/17/22 53   PAD s/p LSFA stent 2012, RCEA 2006, endovascular AAA repair 2020, stent to SMA for chronic mesenteric ischemia 2021      Anemia per CCM Hgb 8.8        For questions or updates, please contact [place] Please consult [email or web address] for contact info under        Signed, Cecilie Kicks, NP  04-Jun-2022, 8:00 AM    Patient seen and examined and agree with Cecilie Kicks, NP as detailed above.   In brief, the patient is a 77 year old male with history of PAD with LSFA stent 2012, RCEA 2006, HTN, HLD, mesenteric ischemia with stent to SMA and endovascular abdominal aneurysm repair in 2020 with recent admission 12/5-12/11 with PNA and new Afib with RVR who returned on 06/04/2022 for worsening hypoxia and concern for ARDS. Was initially intubated but extubated on 12/16. Developed recurrent worsening distress and required re-intubation on 12/17. ECG at that time with worsening STD with TWI in anterolateral leads. Trop >25,000. Cardiology was consulted for further management.   Today, the patient continued to clinically decline with rising pressor requirement. After discussion with the family, the patient was transitioned to comfort measures.    Plan: -Transitioned to comfort measures and no further CV work-up indicated at this time   Gwyndolyn Kaufman, MD

## 2022-06-10 NOTE — Progress Notes (Signed)
Spoke to patient's wife, provided update from overnight shift.

## 2022-06-10 DEATH — deceased

## 2022-07-11 NOTE — Progress Notes (Signed)
CKD stage 3b
